# Patient Record
Sex: Female | Born: 1960 | Race: White | Hispanic: No | Smoking: Never smoker
Health system: Southern US, Community
[De-identification: ages and names within clinical notes are randomized; demographics above are authoritative.]

## PROBLEM LIST (undated history)

## (undated) ENCOUNTER — Ambulatory Visit

## (undated) DIAGNOSIS — M1990 Unspecified osteoarthritis, unspecified site: Secondary | ICD-10-CM

## (undated) DIAGNOSIS — R51 Headache: Secondary | ICD-10-CM

## (undated) DIAGNOSIS — F419 Anxiety disorder, unspecified: Secondary | ICD-10-CM

## (undated) DIAGNOSIS — A419 Sepsis, unspecified organism: Secondary | ICD-10-CM

## (undated) DIAGNOSIS — G2581 Restless legs syndrome: Secondary | ICD-10-CM

## (undated) DIAGNOSIS — M810 Age-related osteoporosis without current pathological fracture: Secondary | ICD-10-CM

## (undated) DIAGNOSIS — R519 Headache, unspecified: Secondary | ICD-10-CM

## (undated) DIAGNOSIS — I82409 Acute embolism and thrombosis of unspecified deep veins of unspecified lower extremity: Secondary | ICD-10-CM

## (undated) DIAGNOSIS — F32A Depression, unspecified: Secondary | ICD-10-CM

## (undated) DIAGNOSIS — IMO0002 Reserved for concepts with insufficient information to code with codable children: Secondary | ICD-10-CM

## (undated) DIAGNOSIS — Z87442 Personal history of urinary calculi: Secondary | ICD-10-CM

## (undated) DIAGNOSIS — T7840XA Allergy, unspecified, initial encounter: Secondary | ICD-10-CM

## (undated) DIAGNOSIS — N3289 Other specified disorders of bladder: Secondary | ICD-10-CM

## (undated) DIAGNOSIS — F329 Major depressive disorder, single episode, unspecified: Secondary | ICD-10-CM

## (undated) DIAGNOSIS — K219 Gastro-esophageal reflux disease without esophagitis: Secondary | ICD-10-CM

## (undated) DIAGNOSIS — N201 Calculus of ureter: Secondary | ICD-10-CM

## (undated) DIAGNOSIS — N3941 Urge incontinence: Secondary | ICD-10-CM

## (undated) DIAGNOSIS — G709 Myoneural disorder, unspecified: Secondary | ICD-10-CM

## (undated) DIAGNOSIS — D649 Anemia, unspecified: Secondary | ICD-10-CM

## (undated) DIAGNOSIS — G4730 Sleep apnea, unspecified: Secondary | ICD-10-CM

## (undated) DIAGNOSIS — N200 Calculus of kidney: Secondary | ICD-10-CM

## (undated) HISTORY — DX: Reserved for concepts with insufficient information to code with codable children: IMO0002

## (undated) HISTORY — DX: Other specified disorders of bladder: N32.89

## (undated) HISTORY — DX: Acute embolism and thrombosis of unspecified deep veins of unspecified lower extremity: I82.409

## (undated) HISTORY — PX: CHOLECYSTECTOMY: SHX55

## (undated) HISTORY — DX: Gastro-esophageal reflux disease without esophagitis: K21.9

## (undated) HISTORY — DX: Age-related osteoporosis without current pathological fracture: M81.0

## (undated) HISTORY — DX: Urge incontinence: N39.41

## (undated) HISTORY — DX: Myoneural disorder, unspecified: G70.9

## (undated) HISTORY — PX: ABDOMINAL HYSTERECTOMY: SHX81

## (undated) HISTORY — DX: Allergy, unspecified, initial encounter: T78.40XA

## (undated) HISTORY — PX: KIDNEY STONE SURGERY: SHX686

## (undated) HISTORY — DX: Calculus of ureter: N20.1

## (undated) HISTORY — PX: KNEE ARTHROSCOPY: SHX127

## (undated) HISTORY — PX: SINUS EXPLORATION: SHX5214

---

## 2004-05-19 HISTORY — PX: KNEE SURGERY: SHX244

## 2007-05-26 ENCOUNTER — Ambulatory Visit (HOSPITAL_COMMUNITY): Admission: RE | Admit: 2007-05-26 | Discharge: 2007-05-26 | Payer: Self-pay | Admitting: Rheumatology

## 2007-06-04 ENCOUNTER — Ambulatory Visit (HOSPITAL_COMMUNITY): Admission: RE | Admit: 2007-06-04 | Discharge: 2007-06-04 | Payer: Self-pay | Admitting: Rheumatology

## 2007-06-10 ENCOUNTER — Ambulatory Visit (HOSPITAL_COMMUNITY): Admission: RE | Admit: 2007-06-10 | Discharge: 2007-06-10 | Payer: Self-pay | Admitting: Rheumatology

## 2007-10-29 ENCOUNTER — Ambulatory Visit (HOSPITAL_COMMUNITY): Admission: RE | Admit: 2007-10-29 | Discharge: 2007-10-29 | Payer: Self-pay | Admitting: Family Medicine

## 2007-11-04 ENCOUNTER — Ambulatory Visit (HOSPITAL_COMMUNITY): Admission: RE | Admit: 2007-11-04 | Discharge: 2007-11-04 | Payer: Self-pay | Admitting: Family Medicine

## 2008-12-11 ENCOUNTER — Ambulatory Visit: Payer: Self-pay | Admitting: Internal Medicine

## 2009-02-01 ENCOUNTER — Ambulatory Visit: Payer: Self-pay | Admitting: Otolaryngology

## 2010-08-10 ENCOUNTER — Ambulatory Visit: Payer: Self-pay | Admitting: Internal Medicine

## 2010-12-22 ENCOUNTER — Emergency Department: Payer: Self-pay | Admitting: Emergency Medicine

## 2011-02-18 ENCOUNTER — Ambulatory Visit: Payer: Self-pay | Admitting: Surgery

## 2011-02-25 ENCOUNTER — Ambulatory Visit: Payer: Self-pay | Admitting: Surgery

## 2011-02-28 LAB — PATHOLOGY REPORT

## 2011-06-30 ENCOUNTER — Ambulatory Visit: Payer: Self-pay

## 2012-01-16 ENCOUNTER — Emergency Department: Payer: Self-pay | Admitting: Emergency Medicine

## 2012-01-16 LAB — COMPREHENSIVE METABOLIC PANEL
Albumin: 3.6 g/dL (ref 3.4–5.0)
Alkaline Phosphatase: 70 U/L (ref 50–136)
Anion Gap: 6 — ABNORMAL LOW (ref 7–16)
BUN: 16 mg/dL (ref 7–18)
Bilirubin,Total: 0.3 mg/dL (ref 0.2–1.0)
Calcium, Total: 8.9 mg/dL (ref 8.5–10.1)
Chloride: 106 mmol/L (ref 98–107)
Co2: 30 mmol/L (ref 21–32)
Creatinine: 0.78 mg/dL (ref 0.60–1.30)
EGFR (African American): >60
EGFR (Non-African Amer.): >60
Glucose: 87 mg/dL (ref 65–99)
Osmolality: 284 (ref 275–301)
Potassium: 3.7 mmol/L (ref 3.5–5.1)
SGOT(AST): 21 U/L (ref 15–37)
SGPT (ALT): 19 U/L (ref 12–78)
Sodium: 142 mmol/L (ref 136–145)
Total Protein: 7.2 g/dL (ref 6.4–8.2)

## 2012-01-16 LAB — CBC
HCT: 40.6 % (ref 35.0–47.0)
HGB: 14 g/dL (ref 12.0–16.0)
MCH: 30 pg (ref 26.0–34.0)
MCHC: 34.6 g/dL (ref 32.0–36.0)
MCV: 87 fL (ref 80–100)
Platelet: 196 10*3/uL (ref 150–440)
RBC: 4.68 10*6/uL (ref 3.80–5.20)
RDW: 12.4 % (ref 11.5–14.5)
WBC: 7.7 10*3/uL (ref 3.6–11.0)

## 2012-01-16 LAB — TROPONIN I: Troponin-I: <0.02 ng/mL

## 2012-01-16 LAB — CK TOTAL AND CKMB (NOT AT ARMC)
CK, Total: 97 U/L (ref 21–215)
CK-MB: 0.9 ng/mL (ref 0.5–3.6)

## 2012-03-15 ENCOUNTER — Ambulatory Visit: Payer: Self-pay

## 2012-03-22 ENCOUNTER — Ambulatory Visit: Payer: Self-pay

## 2012-03-31 ENCOUNTER — Ambulatory Visit: Payer: Self-pay | Admitting: Family Medicine

## 2012-05-09 ENCOUNTER — Ambulatory Visit: Payer: Self-pay | Admitting: Internal Medicine

## 2012-10-08 ENCOUNTER — Ambulatory Visit: Payer: Self-pay | Admitting: Internal Medicine

## 2013-05-02 ENCOUNTER — Ambulatory Visit: Payer: Self-pay | Admitting: Physician Assistant

## 2013-05-18 ENCOUNTER — Ambulatory Visit: Payer: Self-pay | Admitting: Internal Medicine

## 2013-05-27 ENCOUNTER — Ambulatory Visit: Payer: Self-pay | Admitting: Family Medicine

## 2013-06-15 ENCOUNTER — Ambulatory Visit: Payer: Self-pay | Admitting: Family Medicine

## 2013-10-20 ENCOUNTER — Ambulatory Visit: Payer: Self-pay | Admitting: Emergency Medicine

## 2013-10-20 LAB — RAPID STREP-A WITH REFLX: Micro Text Report: NEGATIVE

## 2013-10-22 LAB — BETA STREP CULTURE(ARMC)

## 2013-11-16 DIAGNOSIS — M25519 Pain in unspecified shoulder: Secondary | ICD-10-CM | POA: Insufficient documentation

## 2013-11-16 DIAGNOSIS — M25579 Pain in unspecified ankle and joints of unspecified foot: Secondary | ICD-10-CM | POA: Insufficient documentation

## 2013-12-02 ENCOUNTER — Ambulatory Visit: Payer: Self-pay | Admitting: Unknown Physician Specialty

## 2014-06-20 ENCOUNTER — Inpatient Hospital Stay: Payer: Self-pay | Admitting: Internal Medicine

## 2014-06-20 ENCOUNTER — Ambulatory Visit
Admission: RE | Admit: 2014-06-20 | Discharge: 2014-06-20 | Disposition: A | Discharge status: Home / Self Care | Payer: BLUE CROSS/BLUE SHIELD | Source: Ambulatory Visit | Attending: Gastroenterology | Admitting: Gastroenterology

## 2014-06-20 ENCOUNTER — Other Ambulatory Visit: Payer: Self-pay | Admitting: Gastroenterology

## 2014-06-20 DIAGNOSIS — R1032 Left lower quadrant pain: Secondary | ICD-10-CM

## 2014-06-20 LAB — URINALYSIS, COMPLETE
Bacteria: NONE SEEN
Bilirubin,UR: NEGATIVE
Glucose,UR: NEGATIVE mg/dL (ref 0–75)
Ketone: NEGATIVE
Nitrite: NEGATIVE
Ph: 6 (ref 4.5–8.0)
Protein: NEGATIVE
RBC,UR: 1 /HPF (ref 0–5)
Specific Gravity: 1.021 (ref 1.003–1.030)
Squamous Epithelial: 1
WBC UR: 144 /HPF (ref 0–5)

## 2014-06-20 LAB — COMPREHENSIVE METABOLIC PANEL
Albumin: 3.5 g/dL (ref 3.4–5.0)
Alkaline Phosphatase: 114 U/L (ref 46–116)
Anion Gap: 9 (ref 7–16)
BUN: 21 mg/dL — ABNORMAL HIGH (ref 7–18)
Bilirubin,Total: 1.2 mg/dL — ABNORMAL HIGH (ref 0.2–1.0)
Calcium, Total: 9.1 mg/dL (ref 8.5–10.1)
Chloride: 101 mmol/L (ref 98–107)
Co2: 27 mmol/L (ref 21–32)
Creatinine: 1.1 mg/dL (ref 0.60–1.30)
EGFR (African American): >60
EGFR (Non-African Amer.): 55 — ABNORMAL LOW
Glucose: 124 mg/dL — ABNORMAL HIGH (ref 65–99)
Osmolality: 278 (ref 275–301)
Potassium: 3.7 mmol/L (ref 3.5–5.1)
SGOT(AST): 300 U/L — ABNORMAL HIGH (ref 15–37)
SGPT (ALT): 186 U/L — ABNORMAL HIGH (ref 14–63)
Sodium: 137 mmol/L (ref 136–145)
Total Protein: 7.4 g/dL (ref 6.4–8.2)

## 2014-06-20 LAB — CBC
HCT: 42.8 % (ref 35.0–47.0)
HGB: 14.2 g/dL (ref 12.0–16.0)
MCH: 28.6 pg (ref 26.0–34.0)
MCHC: 33.2 g/dL (ref 32.0–36.0)
MCV: 86 fL (ref 80–100)
Platelet: 226 10*3/uL (ref 150–440)
RBC: 4.96 10*6/uL (ref 3.80–5.20)
RDW: 13.2 % (ref 11.5–14.5)
WBC: 21.7 10*3/uL — ABNORMAL HIGH (ref 3.6–11.0)

## 2014-06-20 MED ORDER — IOHEXOL 300 MG/ML  SOLN
125.0000 mL | Freq: Once | INTRAMUSCULAR | Status: AC | PRN
  Administered 2014-06-20: 125 mL via INTRAVENOUS

## 2014-06-21 LAB — CBC WITH DIFFERENTIAL/PLATELET
Basophil #: 0 10*3/uL (ref 0.0–0.1)
Basophil %: 0.1 %
Eosinophil #: 0 10*3/uL (ref 0.0–0.7)
Eosinophil %: 0 %
HCT: 35.1 % (ref 35.0–47.0)
HGB: 11.7 g/dL — ABNORMAL LOW (ref 12.0–16.0)
Lymphocyte #: 1.3 10*3/uL (ref 1.0–3.6)
Lymphocyte %: 6.4 %
MCH: 28.8 pg (ref 26.0–34.0)
MCHC: 33.5 g/dL (ref 32.0–36.0)
MCV: 86 fL (ref 80–100)
Monocyte #: 1.3 x10 3/mm — ABNORMAL HIGH (ref 0.2–0.9)
Monocyte %: 6.4 %
Neutrophil #: 17.8 10*3/uL — ABNORMAL HIGH (ref 1.4–6.5)
Neutrophil %: 87.1 %
Platelet: 150 10*3/uL (ref 150–440)
RBC: 4.08 10*6/uL (ref 3.80–5.20)
RDW: 13.7 % (ref 11.5–14.5)
WBC: 20.4 10*3/uL — ABNORMAL HIGH (ref 3.6–11.0)

## 2014-06-21 LAB — BASIC METABOLIC PANEL
Anion Gap: 6 — ABNORMAL LOW (ref 7–16)
BUN: 17 mg/dL (ref 7–18)
Calcium, Total: 8.2 mg/dL — ABNORMAL LOW (ref 8.5–10.1)
Chloride: 109 mmol/L — ABNORMAL HIGH (ref 98–107)
Co2: 27 mmol/L (ref 21–32)
Creatinine: 0.91 mg/dL (ref 0.60–1.30)
EGFR (African American): >60
EGFR (Non-African Amer.): >60
Glucose: 129 mg/dL — ABNORMAL HIGH (ref 65–99)
Osmolality: 286 (ref 275–301)
Potassium: 3.7 mmol/L (ref 3.5–5.1)
Sodium: 142 mmol/L (ref 136–145)

## 2014-06-22 LAB — URINE CULTURE

## 2014-06-25 LAB — CULTURE, BLOOD (SINGLE)

## 2014-07-13 ENCOUNTER — Ambulatory Visit: Payer: Self-pay | Admitting: Urology

## 2014-07-19 ENCOUNTER — Ambulatory Visit: Payer: Self-pay | Admitting: Urology

## 2014-08-24 ENCOUNTER — Ambulatory Visit
Admit: 2014-08-24 | Disposition: A | Discharge status: Home / Self Care | Payer: Self-pay | Attending: Urology | Admitting: Urology

## 2014-09-17 NOTE — Op Note (Signed)
PATIENT NAME:  Sharon [name], Sharon [name] MR#:  [id number] DATE OF BIRTH:  [birth year]  DATE OF PROCEDURE:  06/20/2014  PREOPERATIVE DIAGNOSIS:  Infected, obstructing, large left distal ureteral stone.  POSTOPERATIVE DIAGNOSIS:  Infected, obstructing, large left distal ureteral stone.  PRINCIPAL PROCEDURE:  Cystoscopy, left double-J stent placement (7 French x 24 cm Contour) without tether.   SURGEON:  Lillette Boxer. Dahlstedt, MD  ANESTHESIA:  General with LMA.  COMPLICATIONS:  None.  BRIEF HISTORY:  A 54 year old female who presented to the Emergency Room at [facility] earlier today with left flank pain with a history of this for approximately 1 month. She underwent colonoscopy recently, and with her pain continuing, she underwent CT scan, which revealed a 12 mm left distal ureteral stone. In the ER, she was tachycardic and had pyuria and an elevated white count. Urology was consulted.   On evaluation, she was found to be in significant pain, and with this infected, obstructing stone, it was recommended that she undergo urgent stent placement versus percutaneous nephrostomy tube placement. She presents at this time for the cystoscopy and stent placement. The risks as well as benefits of this procedure have been discussed with the patient and her daughter, who was in attendance. They desire to proceed.   DESCRIPTION OF PROCEDURE:  The patient was brought to the operating room. She was placed in the dorsal lithotomy position following administration of general anesthesia with LMA. Timeout was performed.   A 22 French panendoscope was advanced into her bladder. The bladder was inspected circumferentially. There were no tumors, trabeculations, or foreign bodies. Ureteral orifices were normal in configuration and location. No stone was evident at the left ureteral orifice. A 0.035 inch angle-tip Sensor tip guidewire was advanced into the left ureteral orifice and easily up to the left  renal pelvis using fluoroscopic guidance. There was no holdup from an obstructing stone. Once the proximal tip was seen fluoroscopically curled in the renal pelvis, a 7 French x 24 cm Contour stent with tether removed was advanced over the guidewire using a pusher. Following removal of the guidewire, excellent proximal and distal curls were seen on the stent using fluoroscopically and cystoscopy, respectively. At this point, significant purulent material was seen coming from the stent and the ureteral orifice on the left. The bladder was drained. The scope was removed. The patient was taken to the PACU in stable condition, having tolerated the procedure well.    ____________________________ Lillette Boxer. Dahlstedt, MD :nb D: 06/20/2014 21:01:57 ET T: 06/21/2014 02:15:33 ET JOB#: [id number]  cc: Lillette Boxer. Diona Fanti, MD, <Dictator>   Jorja Loa MD ELECTRONICALLY SIGNED 08/18/2014 11:31

## 2014-09-17 NOTE — Consult Note (Signed)
Chief Complaint:  Subjective/Chief Complaint Feeling much better this AM, anxious to eat breakfast.  No further fevers since MN.  Tachycardia improved, normotensive.  Complaining of HA from narcotics.   VITAL SIGNS/ANCILLARY NOTES: **Vital Signs.:   03-Feb-16 07:48  Vital Signs Type Q 4hr  Temperature Temperature (F) 98.4  Celsius 36.8  Temperature Source oral  Pulse Pulse 95  Respirations Respirations 18  Systolic BP Systolic BP 94  Diastolic BP (mmHg) Diastolic BP (mmHg) 59  Mean BP 70  Pulse Ox % Pulse Ox % 94  Pulse Ox Activity Level  At rest  Oxygen Delivery Room Air/ 21 %  *Intake and Output.:   Daily 03-Feb-16 07:00  Grand Totals Intake:  800 Output:  500    Net:  300 24 Hr.:  300  IV (Primary)      In:  800  Urine ml     Out:  500  Length of Stay Totals Intake:  800 Output:  500    Net:  300   Brief Assessment:  GEN well developed, well nourished, no acute distress   Cardiac Regular   Respiratory normal resp effort  clear BS   Gastrointestinal Normal   Gastrointestinal details normal Soft  Nontender  Nondistended  No masses palpable   EXTR negative cyanosis/clubbing   Additional Physical Exam Mild left flank pain   Lab Results: Routine Chem:  03-Feb-16 04:32   Glucose, Serum  129  BUN 17  Creatinine (comp) 0.91  Sodium, Serum 142  Potassium, Serum 3.7  Chloride, Serum  109  CO2, Serum 27  Calcium (Total), Serum  8.2  Anion Gap  6  Osmolality (calc) 286  eGFR (African American) >60  eGFR (Non-African American) >60 (eGFR values <70m/min/1.73 m2 may be an indication of chronic kidney disease (CKD). Calculated eGFR, using the MRDR Study equation, is useful in  patients with stable renal function. The eGFR calculation will not be reliable in acutely ill patients when serum creatinine is changing rapidly. It is not useful in patients on dialysis. The eGFR calculation may not be applicable to patients at the low and high extremes of body sizes,  pregnant women, and vegetarians.)  Routine Hem:  03-Feb-16 04:32   WBC (CBC)  20.4  RBC (CBC) 4.08  Hemoglobin (CBC)  11.7  Hematocrit (CBC) 35.1  Platelet Count (CBC) 150  MCV 86  MCH 28.8  MCHC 33.5  RDW 13.7  Neutrophil % 87.1  Lymphocyte % 6.4  Monocyte % 6.4  Eosinophil % 0.0  Basophil % 0.1  Neutrophil #  17.8  Lymphocyte # 1.3  Monocyte #  1.3  Eosinophil # 0.0  Basophil # 0.0 (Result(s) reported on 21 Jun 2014 at 05:33AM.)   Assessment/Plan:  Assessment/Plan:  Assessment 54[facility] old [facility] with a history of this for approximately 1 month. CT scan revealed a 12 mm left distal ureteral stone. In the ER, she was tachycardic and had pyuria and an elevated white count therefore she was taken to the OR by Dr. DLuberta Robertsonfor left ureteral stent placement.  Her vitals have normalized overnight.  UCx growing 100K GNR, on cipro.   Plan 1) continue abx, adjust based on culture and sensitivity data 2) ditropan 5 mg tid prn for bladder spasms 3) Flomax 0.4 mg for ureteral spasms 4) minimize narcotics as possible 5) OK for discharge with Urology f/u once afebrile x 24 hours, discussed need  for definative stone surgery as outpatient   Electronic Signatures: Sherlynn Stalls (MD)  (Signed 03-Feb-16 10:49)  Authored: Chief Complaint, VITAL SIGNS/ANCILLARY NOTES, Brief Assessment, Lab Results, Assessment/Plan   Last Updated: 03-Feb-16 10:49 by Sherlynn Stalls (MD)

## 2014-09-17 NOTE — Op Note (Signed)
PATIENT NAME:  Sharon [name], Sharon [name] MR#:  [id number] DATE OF BIRTH:  [birth year]  DATE OF PROCEDURE:  07/19/2014  PREOPERATIVE DIAGNOSIS: Left ureteropelvic junction stone.   POSTOPERATIVE DIAGNOSIS: Left ureteropelvic junction stone.   PROCEDURE PERFORMED: Left ureteroscopy, laser lithotripsy, left ureteral stent exchange, left retrograde pyelogram  ANESTHESIA: General anesthesia.   ATTENDING SURGEON: Sherlynn Stalls, MD   ESTIMATED BLOOD LOSS: Minimal.   DRAINS: A 6 x 24 French double-J ureteral stent on the left, no string.   SPECIMENS: Stone fragment.   COMPLICATIONS: None.   INDICATION: This is a 54 year old female who presented with acute onset left flank pain and leukocytosis, who was admitted a few weeks ago following left ureteral stent placement. She did have purulent drainage at the time of stent placement. She was ultimately discharged and presents today for definitive management of her 12 mm left UPJ stone. Risks and benefits of the procedure were explained in detail. The patient agreed to proceed as planned.   PROCEDURE: The patient was correctly identified in the preoperative holding area and informed consent was confirmed. She was brought to the operating suite and placed on the table in the supine position. At this time, a universal timeout protocol was performed and team members were identified. Venodyne boots were placed and she was administered IV ampicillin and gentamicin in the perioperative period. She was then placed under general anesthesia after being intubated. She was repositioned lower on the bed in the dorsal lithotomy position and prepped and draped in standard surgical fashion. A 22 French rigid cystoscope was advanced per urethra into the bladder. Attention was turned to the left ureteral orifice up through which a left ureteral stent was seen emanating. The distal coil of the ureteral stent was grasped and brought to the level of the urethral meatus. This was  then cannulated using a Sensor wire up to the level of the kidney. The stent was removed. The wire was snapped in place as a safety wire. The cystoscope was reintroduced and a second wire was placed up to the level of the kidney without difficulty. Given the stone burden, I did elect to use an access sheath using a 12/14 [facility] access sheath, which advanced quite easily up to the level of the UPJ. Of note, the stone could be seen within the renal pelvis on fluoroscopy. The inner cannula of the access sheath was removed and a flexible ureteroscope was advanced into the renal pelvis at which time the stone was identified. A 365 micron laser fiber was then brought in using the settings of 0.2 joules and 50 Hz. The stone was fragmented into very, very small pieces into dust-like debris. Some small fragments were chased into the upper pole, mid pole and lower pole calyces and care was taken to ensure that the stone was completely obliterated and there were no sizable pieces left greater than the tip of the laser fiber. I then did use a basket to basket out some of these dust fragments for stone analysis. The scope was then brought to the level of the UPJ and an additional retrograde pyelogram was performed. This created a map of the calyces and care was taken to inspect each and every calyx to ensure there were no residual stone fragments. Once the kidney was deemed adequately clear, the ureteroscope was backed down the length of the ureter removing the access sheath at the same time directly visualizing the ureter along the way. There was no ureteral trauma or residual stone fragments  within the ureter noted. A 6 x 24 French double-J ureteral stent was then placed under fluoroscopic guidance over the safety wire up to the level of the renal pelvis. The wire was partially withdrawn and a coil was noted within the renal pelvis. The wire was then fully withdrawn and a coil was noted within the bladder. The bladder was then  drained. The patient was repositioned in the supine position, reversed from anesthesia, and taken to the PACU in stable condition. There were no complications in this case.    ____________________________ Sherlynn Stalls, MD ajb:at D: 07/19/2014 16:53:49 ET T: 07/20/2014 15:36:47 ET JOB#: [id number]  cc: Sherlynn Stalls, MD, <Dictator> Sherlynn Stalls MD ELECTRONICALLY SIGNED 08/23/2014 8:12

## 2014-09-17 NOTE — Discharge Summary (Signed)
PATIENT NAME:  Sharon [name], Sharon [name] MR#:  [id number] DATE OF BIRTH:  02-27-1961  DATE OF ADMISSION:  06/20/2014 DATE OF DISCHARGE:  06/22/2014  DISCHARGE DIAGNOSES: 1.  Obstructive nephrolithiasis status post ureteral stent.  2.  Escherichia coli urinary tract infection. 3.  Sepsis.   SECONDARY DIAGNOSES:   Obesity, migraine headache, allergies, depression.  CONSULTATIONS: Urology, Dr. Hollice Espy.   PROCEDURES AND RADIOLOGY: Left ureteral stent for left distal ureteral stone which was infected, status post cystoscopy by Dr. Franchot Gallo.   Chest x-ray on 2nd of February showed no acute cardiopulmonary disease.   MAJOR LABORATORY PANEL: UA admission showed 144 WBCs, 3+ leukocyte esterase, no bacteria.   Blood cultures x2 were negative.   Urine culture grew more than 100,000 colonies of E. coli, pansensitive.   HISTORY AND SHORT HOSPITAL COURSE: The patient is a 54 year old female with the above-mentioned medical problems who was admitted for left flank pain with fever and chills, was found to have sepsis due to obstructing nephrolithiasis and UTI. Please see Dr. Lianne Moris dictated history and physical for further details. Urology consultation was obtained with Dr. Hollice Espy whose associate went ahead and put left ureteral stent for infected large left distal ureteral stone. The patient was subsequently doing well. Her pain started slowly improving. She did not have anymore fever in the last 24 to 48 hours, was slowly able to tolerate the diet and her urine grew E. coli for which her antibiotic was switched to p.o. ciprofloxacin. She was in agreement with the same. After discussion with urology and the patient, the patient was agreeable to go home on oral antibiotic. She was feeling much better on the date of discharge.  PERTINENT PHYSICAL EXAMINATION ON THE DATE OF DISCHARGE:  VITAL SIGNS: On the date of discharge, temperature 98, heart rate 86 per minute, respirations 17 per  minute, blood pressure 126/59 and she was saturating 95% on room air.  CARDIOVASCULAR: S1, S2 normal. No murmurs, rubs or gallop.  LUNGS: Clear to auscultation bilaterally. No wheezing, rales, rhonchi, crepitation.  ABDOMEN: Soft, benign.  NEUROLOGIC: Nonfocal examination. All other physical examination remained at baseline.   DISCHARGE MEDICATIONS:  Medication Instructions  levocetirizine 5 mg oral tablet  1 tab(s) orally once a day (in the evening)   famotidine 40 mg oral tablet  1 tab(s) orally once a day (at bedtime)   doxepin 10 mg oral capsule  1 cap(s) orally once a day (in the evening)   montelukast 10 mg oral tablet  1 tab(s) orally once a day (in the morning)   linzess 290 mcg oral capsule  1 cap(s) orally once a day   cipro 500 mg oral tablet  1 tab(s) orally every 12 hours x 7 days   ibuprofen 800 mg oral tablet  1 tab(s) orally 3 times a day (with meals) x 5 days, As Needed - for Pain    DISCHARGE DIET: Regular.   DISCHARGE ACTIVITY: As tolerated.   DISCHARGE INSTRUCTIONS AND FOLLOW-UP: The patient was instructed to follow up with her urologist, Dr. Hollice Espy, in 1 to 2 weeks.   TOTAL TIME DISCHARGING THIS PATIENT: 45 minutes.  ____________________________ Lucina Mellow. Manuella Ghazi, MD vss:sb D: 06/22/2014 16:39:10 ET T: 06/22/2014 17:16:07 ET JOB#: [id number]  cc: Duwan Adrian S. Manuella Ghazi, MD, <Dictator> Sherlynn Stalls, MD Lucina Mellow [facility] MD ELECTRONICALLY SIGNED 06/22/2014 17:47

## 2014-09-17 NOTE — H&P (Signed)
   History of Present Illness This 54 year old female presented to [facility] with a new diagnosis of a 12 mm left distal ureteral stone. She has had left flank/lower quadrant pain for the better part of a month. She recently underwent a negative colonoscopy, and a recent CT A/P for this pain. She was found to have a 12 mm obstructing left ureteral stone. She was sent to the ER here,and was found to have a WBC of 22 k as well as infected urine. She is admitted for urgent stent placement/abx management.   Past History Multiple drug allergies  s/p TAH  s/p sinus surgery  s/p lap choly  No h/o CAD,HTN/DM   Code Status Full Code   Past Med/Surgical Hx:  Obesity:   H/O Migraine headaches:   allergies:   depression:   Cholecystectomy:   sinus:   hysterectomy:   knee surgery:   ALLERGIES:  Penicillin: Rash  Oxycontin: Rash  Percocet 10/325: Rash  Neosporin: Blisters, Rash  Tramadol: Itching  Tape: Rash  Latex: Rash, Itching  Codeine: Other  Hydrocodone: Unknown  Aleve: Unknown  Family and Social History:  Family History calculous disease   Place of Living Home   Review of Systems:  Fever/Chills Yes   Cough No   Sputum No   Abdominal Pain Yes   Diarrhea No   Constipation No   Nausea/Vomiting Yes   SOB/DOE No   Chest Pain No   Tolerating Diet No   Physical Exam:  GEN well developed, well nourished, critically ill appearing   HEENT PERRL, hearing intact to voice   NECK supple  No masses   RESP normal resp effort  clear BS   CARD regular rate   ABD positive tenderness  denies Flank Tenderness  soft   LYMPH negative neck   EXTR negative cyanosis/clubbing   SKIN normal to palpation   PSYCH alert, A+O to time, place, person    Assessment/Admission Diagnosis Obstucting/infected left UVJ stone   Plan I discussed emergent stone management witht the patient and her daughter, who was peresent tiin the room with her. I would recommend urgent cysto/left J2  stent placement. IN  the event that stent is impossible to place, I will get IR involved for left percutaneous nephrostomy tube placement.   THey agree with plan.  She will require admission after the stent placement for adequate management of her unitTI.   Electronic Signatures: Dahlstedt, Lillette Boxer (MD)  (Signed [phone] 19:23)  Authored: CHIEF COMPLAINT and HISTORY, PAST MEDICAL/SURGIAL HISTORY, ALLERGIES, FAMILY AND SOCIAL HISTORY, REVIEW OF SYSTEMS, PHYSICAL EXAM, ASSESSMENT AND PLAN   Last Updated: 02-Feb-16 19:23 by Jorja Loa (MD)

## 2014-09-17 NOTE — H&P (Signed)
PATIENT NAME:  Sharon [name], Sharon [name] MR#:  [id number] DATE OF BIRTH:  [birth year]  DATE OF ADMISSION:  06/20/2014  PRIMARY CARE PHYSICIAN: Nonlocal.  REFERRING PHYSICIAN: Lillette Boxer. Dahlstedt, MD, urologist.  CHIEF COMPLAINT: Left flank pain for 1 month; fever and chills since yesterday.   HISTORY OF PRESENT ILLNESS: The patient is a 54 year old Caucasian female with a history of obesity, allergies, depression, presented to the ED with the above chief complaint. The patient is alert, awake, oriented, in no acute distress. She is status post left ureteral stent just now, she is in recovery room now. The patient said that she has had left flank pain for the past 1 week with hematuria. The patient started to have a fever and chills since yesterday. Temperature was 100 and today increased to 103. The patient got a CT, which showed 12 mm obstructing left ureteral stone. The patient also has WBC 22. She also had tachycardia at 120 to 130. Urologist urgently placed a left ureteral stent just now. He suggested admitting under hospitalist service. The patient has nausea, but no vomiting or diarrhea. She has a headache, but denies any other symptoms.   PAST MEDICAL HISTORY: Obesity, migraine headache, allergies, depression.  PAST SURGICAL HISTORY: Cholecystectomy, hysterectomy, knee surgery.   FAMILY HISTORY: Father had lung disease and died of MI.   ALLERGIES: TO ALEVE, CODEINE, HYDROCODONE, NEOSPORIN, OXYCONTIN, PENICILLIN, PERCOCET, TRAMADOL, LATEX, TAPE.   HOME MEDICATIONS: Medication reconciliation is not done yet; but according to the patient, the patient is taking Singulair and allergy medication, she cannot remember.  REVIEW OF SYSTEMS: CONSTITUTIONAL: The patient has a fever, chills, headache. No dizziness, but has weakness.  EYES: No double vision or blurry vision.  EAR, NOSE, THROAT: No postnasal drip, slurred speech, or dysphagia.  CARDIOVASCULAR: No chest pain, palpitation, orthopnea, or  nocturnal dyspnea. No leg edema.  PULMONARY: No cough, sputum, shortness of breath, or hemoptysis.  GASTROINTESTINAL: Positive for abdominal pain and flank pain and nausea. No vomiting or diarrhea. No melena or bloody stool.  GENITOURINARY: Positive for hematuria, but no dysuria or incontinence.  SKIN: No rash or jaundice.  NEUROLOGY: No syncope, loss of consciousness, or seizure.  ENDOCRINOLOGY: No polyuria, polydipsia, heat or cold intolerance.  HEMATOLOGY: No easy bruising or bleeding.   PHYSICAL EXAMINATION:  VITAL SIGNS: Temperature 102.5, blood pressure 140/76, pulse 130, oxygen saturation 97% on room air.  GENERAL: The patient is alert, awake, oriented, in no acute distress.  HEENT: Pupils round, equal and reactive to light and accommodation. Moist oral mucosa. Clear oropharynx.  NECK: Supple. No JVD or carotid bruit. No lymphadenopathy. No thyromegaly.  CARDIOVASCULAR: S1 and S2, regular rate, tachycardia, no murmurs or gallops.  PULMONARY: Bilateral air entry. No wheezing or rales. No use of accessory muscles to breathe.  ABDOMEN: Soft. No distention but has tenderness in the middle part of the abdomen. Bowel sounds present. No rigidity. No rebound. No organomegaly. Bilateral flank tenderness.  EXTREMITIES: No edema, clubbing or cyanosis. No calf tenderness. Bilateral pedal pulses present.   LABORATORY DATA: Chest x-ray did not show any significant abnormality. Glucose 124, BUN 21, creatinine 1.10. Electrolytes are normal. Bilirubin 1.2, SGOT 300, SGPT 186. WBC 21.7, hemoglobin 14.2, platelet at 226,000. Urinalysis showed wbc 144, rbc 1.   IMPRESSIONS:  1.  Obstructing nephrolithiasis.  2.  Urinary tract infection.  3.  Sepsis.  4.  Abnormal liver function tests.  5.  Obesity.  6.  MULTIPLE DRUG ALLERGIES.  PLAN OF TREATMENT:  1.  The  patient will be admitted to telemetry floor with telemonitor. For sepsis with urinary tract infection, I will continue Cipro IV b.i.d. and follow  up blood culture, urine culture and a CBC.  2.  For obstructing nephrolithiasis, the patient is status post ureteral stent placement. Follow up with urologist.  3.  For abnormal liver function tests, we will get an abdominal ultrasound.  4.  Check lipid panel.  5.  For dehydration, I will give IV fluid support.   I discussed the patient's condition and plan of treatment with the patient. The patient wants full code.   TIME SPENT: About 53 minutes.    ____________________________ Demetrios Loll, MD qc:TT D: 06/20/2014 22:01:13 ET T: 06/20/2014 22:38:06 ET JOB#: [id number]  cc: Demetrios Loll, MD, <Dictator> Demetrios Loll MD ELECTRONICALLY SIGNED 06/22/2014 19:16

## 2015-01-01 ENCOUNTER — Other Ambulatory Visit: Payer: Self-pay

## 2015-01-01 DIAGNOSIS — N200 Calculus of kidney: Secondary | ICD-10-CM

## 2015-01-10 DIAGNOSIS — S43431D Superior glenoid labrum lesion of right shoulder, subsequent encounter: Secondary | ICD-10-CM | POA: Insufficient documentation

## 2015-01-10 DIAGNOSIS — M7581 Other shoulder lesions, right shoulder: Secondary | ICD-10-CM | POA: Insufficient documentation

## 2015-01-10 DIAGNOSIS — S43431A Superior glenoid labrum lesion of right shoulder, initial encounter: Secondary | ICD-10-CM | POA: Insufficient documentation

## 2015-01-30 ENCOUNTER — Other Ambulatory Visit: Payer: Self-pay | Admitting: Surgery

## 2015-01-30 DIAGNOSIS — M7581 Other shoulder lesions, right shoulder: Secondary | ICD-10-CM

## 2015-01-30 DIAGNOSIS — S43431A Superior glenoid labrum lesion of right shoulder, initial encounter: Secondary | ICD-10-CM

## 2015-02-09 ENCOUNTER — Ambulatory Visit
Admission: RE | Admit: 2015-02-09 | Discharge: 2015-02-09 | Disposition: A | Discharge status: Home / Self Care | Payer: BLUE CROSS/BLUE SHIELD | Source: Ambulatory Visit | Attending: Surgery | Admitting: Surgery

## 2015-02-09 DIAGNOSIS — S43431A Superior glenoid labrum lesion of right shoulder, initial encounter: Secondary | ICD-10-CM

## 2015-02-09 DIAGNOSIS — M7551 Bursitis of right shoulder: Secondary | ICD-10-CM | POA: Insufficient documentation

## 2015-02-09 DIAGNOSIS — S46911A Strain of unspecified muscle, fascia and tendon at shoulder and upper arm level, right arm, initial encounter: Secondary | ICD-10-CM | POA: Diagnosis not present

## 2015-02-09 DIAGNOSIS — M7581 Other shoulder lesions, right shoulder: Secondary | ICD-10-CM

## 2015-02-09 DIAGNOSIS — X58XXXA Exposure to other specified factors, initial encounter: Secondary | ICD-10-CM | POA: Diagnosis not present

## 2015-03-20 ENCOUNTER — Encounter: Payer: Self-pay | Admitting: Emergency Medicine

## 2015-03-20 ENCOUNTER — Ambulatory Visit
Admission: EM | Admit: 2015-03-20 | Discharge: 2015-03-20 | Disposition: A | Discharge status: Home / Self Care | Payer: BLUE CROSS/BLUE SHIELD | Attending: Family Medicine | Admitting: Family Medicine

## 2015-03-20 DIAGNOSIS — J069 Acute upper respiratory infection, unspecified: Secondary | ICD-10-CM

## 2015-03-20 HISTORY — DX: Calculus of kidney: N20.0

## 2015-03-20 MED ORDER — BENZONATATE 100 MG PO CAPS: 200.0000 mg | ORAL_CAPSULE | Freq: Three times a day (TID) | ORAL | Status: DC

## 2015-03-20 NOTE — ED Provider Notes (Signed)
CSN: [id number]     Arrival date & time 03/20/15  0807 History   First MD Initiated Contact with Patient 03/20/15 [phone]     Chief Complaint  Patient presents with  . Sore Throat   (Consider location/radiation/quality/duration/timing/severity/associated sxs/prior Treatment) HPI   This 54 year old female who presents with a cough sore throat and body aches as well as facial pain and headache for the last 2 days. States that her sore throat is gotten worse overnight so she decided to come here. She has some productive mucus from her nose but is not having any productive sputum from her lungs. Eyes any fever or chills. His had a postnasal drip which may be what is causing her cough. She has numerous allergies for which she takes allergy shots weekly. Has used lowness in the past but is not using it currently Past Medical History  Diagnosis Date  . Kidney calculi    Past Surgical History  Procedure Laterality Date  . Kidney stone surgery    . Abdominal hysterectomy    . Cholecystectomy    . Knee arthroscopy    . Sinus exploration     Family History  Problem Relation Age of Onset  . Cancer Mother   . Lung cancer Father    Social History  Substance Use Topics  . Smoking status: Never Smoker   . Smokeless tobacco: None  . Alcohol Use: No   OB History    No data available     Review of Systems  Constitutional: Positive for activity change. Negative for fever, chills and fatigue.  HENT: Positive for congestion, postnasal drip, rhinorrhea, sinus pressure, sneezing and sore throat. Negative for ear discharge, ear pain and facial swelling.   Respiratory: Positive for cough.   All other systems reviewed and are negative.   Allergies  Codeine and Penicillins  Home Medications   Prior to Admission medications   Medication Sig Start Date End Date Taking? Authorizing Provider  doxepin (SINEQUAN) 10 MG capsule Take 10 mg by mouth.   Yes Historical Provider, MD  famotidine (PEPCID) 40  MG tablet Take 40 mg by mouth daily.   Yes Historical Provider, MD  levocetirizine (XYZAL) 5 MG tablet Take 5 mg by mouth every evening.   Yes Historical Provider, MD  montelukast (SINGULAIR) 10 MG tablet Take 10 mg by mouth at bedtime.   Yes Historical Provider, MD  benzonatate (TESSALON) 100 MG capsule Take 2 capsules (200 mg total) by mouth every 8 (eight) hours. 03/20/15   Lorin Picket, PA-C   Meds Ordered and Administered this Visit  Medications - No data to display  BP 124/77 mmHg  Pulse 74  Temp(Src) 97.9 F (36.6 C) (Oral)  Resp 18  Ht 5\' 6"  (1.676 m)  Wt 241 lb (109.317 kg)  BMI 38.92 kg/m2  SpO2 99% No data found.   Physical Exam  Constitutional: She is oriented to person, place, and time. She appears well-developed and well-nourished. No distress.  HENT:  Head: Normocephalic and atraumatic.  Right Ear: External ear normal.  Left Ear: External ear normal.  Nose: Nose normal.  Mouth/Throat: Oropharynx is clear and moist. No oropharyngeal exudate.  Eyes: EOM are normal. Pupils are equal, round, and reactive to light. Right eye exhibits no discharge. Left eye exhibits no discharge.  Neck: Neck supple.  Pulmonary/Chest: Effort normal and breath sounds normal. No respiratory distress. She has no wheezes. She has no rales.  Musculoskeletal: Normal range of motion. She exhibits no edema or  tenderness.  Lymphadenopathy:    She has no cervical adenopathy.  Neurological: She is alert and oriented to person, place, and time.  Skin: Skin is warm and dry. No rash noted. She is not diaphoretic. No erythema.  Psychiatric: She has a normal mood and affect. Her behavior is normal. Judgment and thought content normal.  Nursing note and vitals reviewed.   ED Course  Procedures (including critical care time)  Labs Review Labs Reviewed - No data to display  Imaging Review No results found.   Visual Acuity Review  Right Eye Distance:   Left Eye Distance:   Bilateral  Distance:    Right Eye Near:   Left Eye Near:    Bilateral Near:         MDM   1. URI (upper respiratory infection)    New Prescriptions   BENZONATATE (TESSALON) 100 MG CAPSULE    Take 2 capsules (200 mg total) by mouth every 8 (eight) hours.  Plan: 1. Test/x-ray results and diagnosis reviewed with patient 2. rx as per orders; risks, benefits, potential side effects reviewed with patient 3. Recommend supportive treatment with fluids, salt water gargles 4. F/u prn if symptoms worsen or don't improve     Lorin Picket, PA-C 03/20/15 [phone]

## 2015-03-20 NOTE — Discharge Instructions (Signed)
Cool Mist Vaporizers °Vaporizers may help relieve the symptoms of a cough and cold. They add moisture to the air, which helps mucus to become thinner and less sticky. This makes it easier to breathe and cough up secretions. Cool mist vaporizers do not cause serious burns like hot mist vaporizers, which may also be called steamers or humidifiers. Vaporizers have not been proven to help with colds. You should not use a vaporizer if you are allergic to mold. °HOME CARE INSTRUCTIONS °· Follow the package instructions for the vaporizer. °· Do not use anything other than distilled water in the vaporizer. °· Do not run the vaporizer all of the time. This can cause mold or bacteria to grow in the vaporizer. °· Clean the vaporizer after each time it is used. °· Clean and dry the vaporizer well before storing it. °· Stop using the vaporizer if worsening respiratory symptoms develop. °  °This information is not intended to replace advice given to you by your health care provider. Make sure you discuss any questions you have with your health care provider. °  °Document Released: 01/31/2004 Document Revised: 05/10/2013 Document Reviewed: 09/22/2012 °Elsevier Interactive Patient Education ©2016 [employer] ° °Upper Respiratory Infection, Adult °Most upper respiratory infections (URIs) are a viral infection of the air passages leading to the lungs. A URI affects the nose, throat, and upper air passages. The most common type of URI is nasopharyngitis and is typically referred to as "the common cold." °URIs run their course and usually go away on their own. Most of the time, a URI does not require medical attention, but sometimes a bacterial infection in the upper airways can follow a viral infection. This is called a secondary infection. Sinus and middle ear infections are common types of secondary upper respiratory infections. °Bacterial pneumonia can also complicate a URI. A URI can worsen asthma and chronic obstructive  pulmonary disease (COPD). Sometimes, these complications can require emergency medical care and may be life threatening.  °CAUSES °Almost all URIs are caused by viruses. A virus is a type of germ and can spread from one person to another.  °RISKS FACTORS °You may be at risk for a URI if:  °· You smoke.   °· You have chronic heart or lung disease. °· You have a weakened defense (immune) system.   °· You are very young or very old.   °· You have nasal allergies or asthma. °· You work in crowded or poorly ventilated areas. °· You work in health care facilities or schools. °SIGNS AND SYMPTOMS  °Symptoms typically develop 2-3 days after you come in contact with a cold virus. Most viral URIs last 7-10 days. However, viral URIs from the influenza virus (flu virus) can last 14-18 days and are typically more severe. Symptoms may include:  °· Runny or stuffy (congested) nose.   °· Sneezing.   °· Cough.   °· Sore throat.   °· Headache.   °· Fatigue.   °· Fever.   °· Loss of appetite.   °· Pain in your forehead, behind your eyes, and over your cheekbones (sinus pain). °· Muscle aches.   °DIAGNOSIS  °Your health care provider may diagnose a URI by: °· Physical exam. °· Tests to check that your symptoms are not due to another condition such as: °¨ Strep throat. °¨ Sinusitis. °¨ Pneumonia. °¨ Asthma. °TREATMENT  °A URI goes away on its own with time. It cannot be cured with medicines, but medicines may be prescribed or recommended to relieve symptoms. Medicines may help: °· Reduce your fever. °· Reduce   your cough. °· Relieve nasal congestion. °HOME CARE INSTRUCTIONS  °· Take medicines only as directed by your health care provider.   °· Gargle warm saltwater or take cough drops to comfort your throat as directed by your health care provider. °· Use a warm mist humidifier or inhale steam from a shower to increase air moisture. This may make it easier to breathe. °· Drink enough fluid to keep your urine clear or pale yellow.   °· Eat  soups and other clear broths and maintain good nutrition.   °· Rest as needed.   °· Return to work when your temperature has returned to normal or as your health care provider advises. You may need to stay home longer to avoid infecting others. You can also use a face mask and careful hand washing to prevent spread of the virus. °· Increase the usage of your inhaler if you have asthma.   °· Do not use any tobacco products, including cigarettes, chewing tobacco, or electronic cigarettes. If you need help quitting, ask your health care provider. °PREVENTION  °The best way to protect yourself from getting a cold is to practice good hygiene.  °· Avoid oral or hand contact with people with cold symptoms.   °· Wash your hands often if contact occurs.   °There is no clear evidence that vitamin C, vitamin E, echinacea, or exercise reduces the chance of developing a cold. However, it is always recommended to get plenty of rest, exercise, and practice good nutrition.  °SEEK MEDICAL CARE IF:  °· You are getting worse rather than better.   °· Your symptoms are not controlled by medicine.   °· You have chills. °· You have worsening shortness of breath. °· You have brown or red mucus. °· You have yellow or brown nasal discharge. °· You have pain in your face, especially when you bend forward. °· You have a fever. °· You have swollen neck glands. °· You have pain while swallowing. °· You have white areas in the back of your throat. °SEEK IMMEDIATE MEDICAL CARE IF:  °· You have severe or persistent: °¨ Headache. °¨ Ear pain. °¨ Sinus pain. °¨ Chest pain. °· You have chronic lung disease and any of the following: °¨ Wheezing. °¨ Prolonged cough. °¨ Coughing up blood. °¨ A change in your usual mucus. °· You have a stiff neck. °· You have changes in your: °¨ Vision. °¨ Hearing. °¨ Thinking. °¨ Mood. °MAKE SURE YOU:  °· Understand these instructions. °· Will watch your condition. °· Will get help right away if you are not doing well or  get worse. °  °This information is not intended to replace advice given to you by your health care provider. Make sure you discuss any questions you have with your health care provider. °  °Document Released: 10/29/2000 Document Revised: 09/19/2014 Document Reviewed: 08/10/2013 °Elsevier Interactive Patient Education ©2016 Elsevier Inc. ° °

## 2015-03-20 NOTE — ED Notes (Signed)
Patient states she developed a cough, sore throat and body aches, headache and facial pain 2 days ago

## 2015-08-27 ENCOUNTER — Encounter: Payer: Self-pay | Admitting: *Deleted

## 2015-09-05 DIAGNOSIS — M2550 Pain in unspecified joint: Secondary | ICD-10-CM | POA: Insufficient documentation

## 2015-09-05 DIAGNOSIS — Z9049 Acquired absence of other specified parts of digestive tract: Secondary | ICD-10-CM | POA: Insufficient documentation

## 2015-09-06 ENCOUNTER — Ambulatory Visit (INDEPENDENT_AMBULATORY_CARE_PROVIDER_SITE_OTHER): Payer: BLUE CROSS/BLUE SHIELD | Admitting: Urology

## 2015-09-06 ENCOUNTER — Ambulatory Visit
Admission: RE | Admit: 2015-09-06 | Discharge: 2015-09-06 | Disposition: A | Discharge status: Home / Self Care | Payer: BLUE CROSS/BLUE SHIELD | Source: Ambulatory Visit | Attending: Urology | Admitting: Urology

## 2015-09-06 ENCOUNTER — Encounter: Payer: Self-pay | Admitting: Urology

## 2015-09-06 VITALS — BP 114/80 | HR 118 | Ht 66.0 in | Wt 247.5 lb

## 2015-09-06 DIAGNOSIS — N3941 Urge incontinence: Secondary | ICD-10-CM | POA: Diagnosis not present

## 2015-09-06 DIAGNOSIS — R351 Nocturia: Secondary | ICD-10-CM | POA: Diagnosis not present

## 2015-09-06 DIAGNOSIS — N200 Calculus of kidney: Secondary | ICD-10-CM

## 2015-09-06 LAB — URINALYSIS, COMPLETE
Bilirubin, UA: NEGATIVE
Glucose, UA: NEGATIVE
Ketones, UA: NEGATIVE
Nitrite, UA: NEGATIVE
Protein, UA: NEGATIVE
RBC, UA: NEGATIVE
Specific Gravity, UA: 1.015 (ref 1.005–1.030)
Urobilinogen, Ur: 0.2 mg/dL (ref 0.2–1.0)
pH, UA: 6.5 (ref 5.0–7.5)

## 2015-09-06 LAB — MICROSCOPIC EXAMINATION
Epithelial Cells (non renal): >10 /hpf — AB (ref 0–10)
RBC, UA: NONE SEEN /hpf (ref 0–?)

## 2015-09-06 LAB — BLADDER SCAN AMB NON-IMAGING: Scan Result: 41

## 2015-09-06 NOTE — Progress Notes (Signed)
09/06/2015 10:05 AM   Sharon [name] Jul 19, 1960 [id number]  Referring provider: No referring provider defined for this encounter.  Chief Complaint  Patient presents with  . Nephrolithiasis    1 year follow up with KUB    HPI: Patient is a 55 year old Caucasian female who has a history of a left ureteral stone who presents today for a 1 year follow-up.  Background history Patient presented to the Emergency Room at [facility] on 06/20/13 with left flank pain with a history of this for approximately 1 month. CT scan revealed a 12 mm left distal ureteral stone. In the ER, she was tachycardic and had pyuria and an elevated white count therefore she was taken to the OR by Dr. Luberta Robertson for left ureteral stent placement. Her vitals have normalized overnight. UCx grew 100K Pansensitive E. coli and she was discharged home on Cipro. She was seen an LEFT ureteroscopy, laser lithotripsy, LEFT ureteral stent exchange on 07/19/14. She tolerated the procedure well and the stone was obliterated. She returned for cystoscopy/ stent removal on 08/04/14. Follow up renal ultrasound shows no residual stone or hydronephrosis.  Stone analysis shows stone composition 15% calcium oxalate monohydrate, 60% calcium oxalate dihydrate, and 25% calcium phosphate.  This was her first stone episode. She does admit to not drinking enough water.    She has not had flank pain or gross hematuria over the last year.  KUB taken today demonstrates a small calculus over the lower pole of the left kidney.  I have personally reviewed the film.    She does states that over the last 3-4 months she is been experiencing urgency, urge incontinence and nocturia.  She states that she is making 5 trips to the bathroom during the day as experiencing 3-4 episodes of nocturia.  She is not losing urine if she laughs, coughs or sneezes.  She has not had any urinary tract infections and does not feel she  is not emptying her bladder.  She is undergoing a sleep apnea evaluation at this time.  Her UA today is unremarkable and her PVR is 41 mL.   PMH: Past Medical History  Diagnosis Date  . Kidney calculi   . Leg DVT (deep venous thromboembolism), acute ([place])   . Acid reflux   . Left ureteral stone   . Bladder spasm     Surgical History: Past Surgical History  Procedure Laterality Date  . Kidney stone surgery    . Abdominal hysterectomy    . Cholecystectomy    . Knee arthroscopy    . Sinus exploration      Home Medications:    Medication List       This list is accurate as of: 09/06/15 10:05 AM.  Always use your most recent med list.               benzonatate 100 MG capsule  Commonly known as:  TESSALON  Take 2 capsules (200 mg total) by mouth every 8 (eight) hours.     cyclobenzaprine 10 MG tablet  Commonly known as:  FLEXERIL  TAKE 1 TABLET (10 MG TOTAL) BY MOUTH 3 (THREE) TIMES DAILY AS NEEDED.     doxepin 10 MG capsule  Commonly known as:  SINEQUAN  Take 10 mg by mouth.     EPIPEN 2-PAK 0.3 mg/0.3 mL Soaj injection  Generic drug:  EPINEPHrine     famotidine 40 MG tablet  Commonly known as:  PEPCID  Take 40 mg  by mouth daily.     fluticasone 50 MCG/ACT nasal spray  Commonly known as:  FLONASE  1-2 SPRAYS ONCE A DAY NASALLY 30 DAYS     ibuprofen 200 MG tablet  Commonly known as:  ADVIL,MOTRIN  Take by mouth. Reported on 09/06/2015     levocetirizine 5 MG tablet  Commonly known as:  XYZAL  Take 5 mg by mouth every evening.     LINZESS 290 MCG Caps capsule  Generic drug:  linaclotide  TAKE ONE CAPSULE BY MOUTH BEFORE FIRST MEAL OF THE DAY     montelukast 10 MG tablet  Commonly known as:  SINGULAIR  Take 10 mg by mouth at bedtime.     mupirocin ointment 2 %  Commonly known as:  BACTROBAN  Reported on 09/06/2015     PATADAY 0.2 % Soln  Generic drug:  Olopatadine HCl     phentermine 37.5 MG tablet  Commonly known as:  ADIPEX-P  Take by  mouth. Reported on 09/06/2015        Allergies:  Allergies  Allergen Reactions  . Alcohol Other (See Comments)  . Bacitracin-Neomycin-Polymyxin Other (See Comments)  . Codeine   . Hydrocodone Other (See Comments)  . Hydrocortisone Other (See Comments)  . Lanolin Other (See Comments)  . Nickel Other (See Comments)  . Penicillins   . Tape Other (See Comments)  . Bacitracin Rash  . Clarithromycin Rash  . Latex Rash  . Oxycodone Rash  . Sulfamethoxazole-Trimethoprim Rash    Family History: Family History  Problem Relation Age of Onset  . Cancer Mother   . Lung cancer Father   . Hematuria Brother   . Kidney disease Paternal Grandfather   . Bladder Cancer Neg Hx   . Kidney Stones Brother     Social History:  reports that she has never smoked. She does not have any smokeless tobacco history on file. She reports that she does not drink alcohol. Her drug history is not on file.  ROS: UROLOGY Frequent Urination?: No Hard to postpone urination?: No Burning/pain with urination?: No Get up at night to urinate?: Yes Leakage of urine?: No Urine stream starts and stops?: No Trouble starting stream?: No Do you have to strain to urinate?: No Blood in urine?: No Urinary tract infection?: No Sexually transmitted disease?: No Injury to kidneys or bladder?: No Painful intercourse?: No Weak stream?: No Currently pregnant?: No Vaginal bleeding?: No Last menstrual period?: n  Gastrointestinal Nausea?: No Vomiting?: No Indigestion/heartburn?: No Diarrhea?: No Constipation?: No  Constitutional Fever: No Night sweats?: No Weight loss?: No Fatigue?: No  Skin Skin rash/lesions?: No Itching?: No  Eyes Blurred vision?: No Double vision?: No  Ears/Nose/Throat Sore throat?: No Sinus problems?: Yes  Hematologic/Lymphatic Swollen glands?: No Easy bruising?: No  Cardiovascular Leg swelling?: No Chest pain?: No  Respiratory Cough?: No Shortness of breath?:  No  Endocrine Excessive thirst?: No  Musculoskeletal Back pain?: Yes Joint pain?: Yes  Neurological Headaches?: Yes Dizziness?: No  Psychologic Depression?: No Anxiety?: No  Physical Exam: BP 114/80 mmHg  Pulse 118  Ht 5\' 6"  (1.676 m)  Wt 247 lb 8 oz (112.265 kg)  BMI 39.97 kg/m2  Constitutional: Well nourished. Alert and oriented, No acute distress. HEENT: [place] AT, moist mucus membranes. Trachea midline, no masses. Cardiovascular: No clubbing, cyanosis, or edema. Respiratory: Normal respiratory effort, no increased work of breathing. Skin: No rashes, bruises or suspicious lesions. Lymph: No cervical or inguinal adenopathy. Neurologic: Grossly intact, no focal deficits, moving all 4  extremities. Psychiatric: Normal mood and affect.  Laboratory Data: Lab Results  Component Value Date   WBC 20.4* 06/21/2014   HGB 11.7* 06/21/2014   HCT 35.1 06/21/2014   MCV 86 06/21/2014   PLT 150 06/21/2014    Lab Results  Component Value Date   CREATININE 0.91 06/21/2014    Lab Results  Component Value Date   AST 300* 06/20/2014   Lab Results  Component Value Date   ALT 186* 06/20/2014     Urinalysis Results for orders placed or performed in visit on 09/06/15  Microscopic Examination  Result Value Ref Range   WBC, UA 11-30 (A) 0 -  5 /hpf   RBC, UA None seen 0 -  2 /hpf   Epithelial Cells (non renal) >10 (A) 0 - 10 /hpf   Renal Epithel, UA 0-10 (A) None seen /hpf   Bacteria, UA Few None seen/Few  Urinalysis, Complete  Result Value Ref Range   Specific Gravity, UA 1.015 1.005 - 1.030   pH, UA 6.5 5.0 - 7.5   Color, UA Yellow Yellow   Appearance Ur Hazy (A) Clear   Leukocytes, UA 2+ (A) Negative   Protein, UA Negative Negative/Trace   Glucose, UA Negative Negative   Ketones, UA Negative Negative   RBC, UA Negative Negative   Bilirubin, UA Negative Negative   Urobilinogen, Ur 0.2 0.2 - 1.0 mg/dL   Nitrite, UA Negative Negative   Microscopic Examination See  below:   BLADDER SCAN AMB NON-IMAGING  Result Value Ref Range   Scan Result 41     Pertinent Imaging: CLINICAL DATA: History of renal calculi.  EXAM: ABDOMEN - 1 VIEW  COMPARISON: CT abdomen and pelvis June 20, 2014  FINDINGS: No ureteral calculi are identified. There remain small calculi in the lower pole the left kidney, largest measuring 4 x 2 mm. None there are probable phleboliths in the lower left pelvis. There is moderate stool in the colon. There is no bowel dilatation or air-fluid level suggesting obstruction. No free air. There are surgical clips in the right upper quadrant region.  IMPRESSION: Small calculi lower pole left kidney. No ureteral calculus evident. Probable phleboliths in the pelvis. Bowel gas pattern overall unremarkable.   Electronically Signed  By: Lowella Grip III M.D.  On: 09/06/2015 12:23  Results for JOCEE, CARREIRA (MRN [id number]) as of 09/10/2015 20:09  Ref. Range 09/06/2015 10:01  Scan Result Unknown 41    Assessment & Plan:    1. Kidney stones:   Patient has a small calculi in the lower pole left kidney.  We will continue to monitor.  She will have yearly KUB's.  She will contact the office or seek treatment in the emergency room if she should develop flank pain or gross hematuria.   2. Urge incontinence:   I discussed with the patient the options of physical therapy and/or medical therapy.  She would like to try an overactive bladder agent.   I have given her samples of Toviaz 4 mg.   I have advised her of the side effects of Toviaz, such as: Dry eyes, dry mouth, constipation, mental confusion and/or urinary retention.  Marland Kitchen   She will return in 3 weeks for PVR and symptom recheck  - Urinalysis, Complete  3. Nocturia:   Patient will be undergoing a sleep study in the future.  We will discuss the findings when she returns in 3 weeks.  Return in about 3 weeks (around 09/27/2015) for PVR.  These notes  generated  with voice recognition software. I apologize for typographical errors.  Zara Council, [facility] [address] [place], [place] [address] [phone]

## 2015-09-10 DIAGNOSIS — N3941 Urge incontinence: Secondary | ICD-10-CM | POA: Insufficient documentation

## 2015-09-10 DIAGNOSIS — N200 Calculus of kidney: Secondary | ICD-10-CM | POA: Insufficient documentation

## 2015-09-10 DIAGNOSIS — R351 Nocturia: Secondary | ICD-10-CM | POA: Insufficient documentation

## 2015-09-11 ENCOUNTER — Telehealth: Payer: Self-pay

## 2015-09-11 NOTE — Telephone Encounter (Signed)
-----   Message from Nori Riis, PA-C sent at 09/06/2015  4:38 PM EDT ----- Patient has a small stone in her left kidney.  I will be seeing her again in three weeks.

## 2015-09-11 NOTE — Telephone Encounter (Signed)
Spoke with pt in reference to stone and f/u appt. Pt voiced understanding.

## 2015-09-21 DIAGNOSIS — G4733 Obstructive sleep apnea (adult) (pediatric): Secondary | ICD-10-CM | POA: Insufficient documentation

## 2015-09-25 ENCOUNTER — Encounter: Payer: Self-pay | Admitting: Urology

## 2015-09-25 ENCOUNTER — Ambulatory Visit (INDEPENDENT_AMBULATORY_CARE_PROVIDER_SITE_OTHER): Payer: BLUE CROSS/BLUE SHIELD | Admitting: Urology

## 2015-09-25 VITALS — BP 121/88 | HR 81 | Ht 66.0 in | Wt 249.4 lb

## 2015-09-25 DIAGNOSIS — R351 Nocturia: Secondary | ICD-10-CM

## 2015-09-25 DIAGNOSIS — N3941 Urge incontinence: Secondary | ICD-10-CM | POA: Diagnosis not present

## 2015-09-25 DIAGNOSIS — N200 Calculus of kidney: Secondary | ICD-10-CM | POA: Diagnosis not present

## 2015-09-25 LAB — BLADDER SCAN AMB NON-IMAGING: Scan Result: 108

## 2015-09-25 NOTE — Progress Notes (Signed)
9:18 AM   Sharon [name] 08-Dec-1960 [id number]  Referring provider: No referring provider defined for this encounter.  Chief Complaint  Patient presents with  . Urge Incontinence    3 week follow up    HPI: Patient is a 55 year old Caucasian female who presents today after a three week trial of Toviaz 4 mg daily for urge incontinence.    Kidney stone Patient has been having lower back pain and is wondering if it could be her stone.  Recent KUB noted a small left renal calculus.  She has not had gross hematuria or left flank pain.  She denies any fevers, chills, nausea or vomiting.  The pain is worse in the morning and eases off during the day.    Nocturia Patient states she is still getting up three times nightly.  She has undergone a sleep study and most likely has sleep apnea as she has to return for an additional sleep study.  She has not received the formal report at this time.  Urge incontinence She does states that over the last 3-4 months, she has been experiencing urgency, urge incontinence and nocturia.  She states that she is making 5 trips to the bathroom during the day as experiencing 3-4 episodes of nocturia.  She is not losing urine if she laughs, coughs or sneezes.  She has not had any urinary tract infections and does not feel she is not emptying her bladder.  She found just a very mild improvement with her symptoms with the Toviaz 4 mg daily.  She did experience constipation, but she changed her Linzess dose to the morning and this relieved the constipation.    Her PVR was 116mL.    PMH: Past Medical History  Diagnosis Date  . Kidney calculi   . Leg DVT (deep venous thromboembolism), acute ([place])   . Acid reflux   . Left ureteral stone   . Bladder spasm   . Urge incontinence     Surgical History: Past Surgical History  Procedure Laterality Date  . Kidney stone surgery    . Abdominal hysterectomy    . Cholecystectomy    . Knee arthroscopy      . Sinus exploration      Home Medications:    Medication List       This list is accurate as of: 09/25/15  9:18 AM.  Always use your most recent med list.               benzonatate 100 MG capsule  Commonly known as:  TESSALON  Take 2 capsules (200 mg total) by mouth every 8 (eight) hours.     cyclobenzaprine 10 MG tablet  Commonly known as:  FLEXERIL  TAKE 1 TABLET (10 MG TOTAL) BY MOUTH 3 (THREE) TIMES DAILY AS NEEDED.     doxepin 10 MG capsule  Commonly known as:  SINEQUAN  Take 10 mg by mouth.     EPIPEN 2-PAK 0.3 mg/0.3 mL Soaj injection  Generic drug:  EPINEPHrine     famotidine 40 MG tablet  Commonly known as:  PEPCID  Take 40 mg by mouth daily. Reported on 09/25/2015     fluticasone 50 MCG/ACT nasal spray  Commonly known as:  FLONASE  1-2 SPRAYS ONCE A DAY NASALLY 30 DAYS     ibuprofen 200 MG tablet  Commonly known as:  ADVIL,MOTRIN  Take by mouth. Reported on 09/06/2015     levocetirizine 5 MG tablet  Commonly known as:  XYZAL  Take 5 mg by mouth every evening.     LINZESS 290 MCG Caps capsule  Generic drug:  linaclotide  TAKE ONE CAPSULE BY MOUTH BEFORE FIRST MEAL OF THE DAY     montelukast 10 MG tablet  Commonly known as:  SINGULAIR  Take 10 mg by mouth at bedtime.     mupirocin ointment 2 %  Commonly known as:  BACTROBAN  Reported on 09/25/2015     PATADAY 0.2 % Soln  Generic drug:  Olopatadine HCl     phentermine 37.5 MG tablet  Commonly known as:  ADIPEX-P  Take by mouth. Reported on 09/25/2015        Allergies:  Allergies  Allergen Reactions  . Alcohol Other (See Comments)  . Bacitracin-Neomycin-Polymyxin Other (See Comments)  . Codeine   . Hydrocodone Other (See Comments)  . Hydrocortisone Other (See Comments)  . Lanolin Other (See Comments)  . Nickel Other (See Comments)  . Penicillins   . Tape Other (See Comments)  . Bacitracin Rash  . Clarithromycin Rash  . Latex Rash  . Oxycodone Rash  . Sulfamethoxazole-Trimethoprim Rash     Family History: Family History  Problem Relation Age of Onset  . Cancer Mother   . Lung cancer Father   . Hematuria Brother   . Kidney disease Paternal Grandfather   . Bladder Cancer Neg Hx   . Kidney Stones Brother     Social History:  reports that she has never smoked. She does not have any smokeless tobacco history on file. She reports that she does not drink alcohol or use illicit drugs.  ROS: UROLOGY Frequent Urination?: No Hard to postpone urination?: No Burning/pain with urination?: No Get up at night to urinate?: Yes Leakage of urine?: Yes Urine stream starts and stops?: No Trouble starting stream?: No Do you have to strain to urinate?: No Blood in urine?: No Urinary tract infection?: No Sexually transmitted disease?: No Injury to kidneys or bladder?: No Painful intercourse?: No Weak stream?: No Currently pregnant?: No Vaginal bleeding?: No Last menstrual period?: n  Gastrointestinal Nausea?: No Vomiting?: No Indigestion/heartburn?: Yes Diarrhea?: No Constipation?: No  Constitutional Fever: No Night sweats?: No Weight loss?: No Fatigue?: No  Skin Skin rash/lesions?: No Itching?: No  Eyes Blurred vision?: No Double vision?: No  Ears/Nose/Throat Sore throat?: No Sinus problems?: Yes  Hematologic/Lymphatic Swollen glands?: No Easy bruising?: No  Cardiovascular Leg swelling?: No Chest pain?: No  Respiratory Cough?: No Shortness of breath?: No  Endocrine Excessive thirst?: No  Musculoskeletal Back pain?: Yes Joint pain?: No  Neurological Headaches?: Yes Dizziness?: No  Psychologic Depression?: No Anxiety?: No  Physical Exam: BP 121/88 mmHg  Pulse 81  Ht 5\' 6"  (1.676 m)  Wt 249 lb 6.4 oz (113.127 kg)  BMI 40.27 kg/m2  Constitutional: Well nourished. Alert and oriented, No acute distress. HEENT: [place] AT, moist mucus membranes. Trachea midline, no masses. Cardiovascular: No clubbing, cyanosis, or edema. Respiratory:  Normal respiratory effort, no increased work of breathing. Skin: No rashes, bruises or suspicious lesions. Lymph: No cervical or inguinal adenopathy. Neurologic: Grossly intact, no focal deficits, moving all 4 extremities. Psychiatric: Normal mood and affect.  Laboratory Data: Lab Results  Component Value Date   WBC 20.4* 06/21/2014   HGB 11.7* 06/21/2014   HCT 35.1 06/21/2014   MCV 86 06/21/2014   PLT 150 06/21/2014    Lab Results  Component Value Date   CREATININE 0.91 06/21/2014    Lab Results  Component Value Date   AST 300* 06/20/2014   Lab Results  Component Value Date   ALT 186* 06/20/2014     Pertinent imaging Results for orders placed or performed in visit on 09/25/15  BLADDER SCAN AMB NON-IMAGING  Result Value Ref Range   Scan Result 108     Pertinent Imaging: CLINICAL DATA: History of renal calculi.  EXAM: ABDOMEN - 1 VIEW  COMPARISON: CT abdomen and pelvis June 20, 2014  FINDINGS: No ureteral calculi are identified. There remain small calculi in the lower pole the left kidney, largest measuring 4 x 2 mm. None there are probable phleboliths in the lower left pelvis. There is moderate stool in the colon. There is no bowel dilatation or air-fluid level suggesting obstruction. No free air. There are surgical clips in the right upper quadrant region.  IMPRESSION: Small calculi lower pole left kidney. No ureteral calculus evident. Probable phleboliths in the pelvis. Bowel gas pattern overall unremarkable.   Electronically Signed  By: Lowella Grip III M.D.  On: 09/06/2015 12:23   Assessment & Plan:    1. Kidney stones:   Lower back pain is most likely musculoskeletal in nature.  Patient has a small calculi in the lower pole left kidney.  We will continue to monitor.  She will have yearly KUB's.  She will contact the office or seek treatment in the emergency room if she should develop flank pain or gross hematuria.   2. Urge  incontinence:   She did not find the Toviaz 4 mg daily helpful in controlling her urge incontinence  I discussed with the patient the options of increasing the dosage of the Toviaz, trying another OAB agent, PTNS or physical therapy.   She would like to try an increased dose of the Toviaz.    I have given her samples of Toviaz 8 mg and Toviaz 4 mg to double up on.     - She will return in 3 weeks for PVR and symptom recheck  3. Nocturia:   Patient was found to have sleep apnea and will be returning for a CPAP titration study.    Return in about 3 weeks (around 10/16/2015) for PVR and symptom recheck.  These notes generated with voice recognition software. I apologize for typographical errors.  Zara Council, [facility] [address], [place], [place] [address] [phone]

## 2015-10-06 ENCOUNTER — Ambulatory Visit
Admission: EM | Admit: 2015-10-06 | Discharge: 2015-10-06 | Disposition: A | Discharge status: Home / Self Care | Payer: BLUE CROSS/BLUE SHIELD | Attending: Family Medicine | Admitting: Family Medicine

## 2015-10-06 DIAGNOSIS — J301 Allergic rhinitis due to pollen: Secondary | ICD-10-CM | POA: Diagnosis not present

## 2015-10-06 DIAGNOSIS — H6593 Unspecified nonsuppurative otitis media, bilateral: Secondary | ICD-10-CM | POA: Diagnosis not present

## 2015-10-06 DIAGNOSIS — J0101 Acute recurrent maxillary sinusitis: Secondary | ICD-10-CM | POA: Diagnosis not present

## 2015-10-06 MED ORDER — FLUTICASONE PROPIONATE 50 MCG/ACT NA SUSP: 1.0000 | Freq: Two times a day (BID) | NASAL | Status: DC

## 2015-10-06 MED ORDER — DOXYCYCLINE HYCLATE 100 MG PO CAPS: 100.0000 mg | ORAL_CAPSULE | Freq: Two times a day (BID) | ORAL | Status: DC

## 2015-10-06 MED ORDER — SALINE SPRAY 0.65 % NA SOLN: 2.0000 | NASAL | Status: DC

## 2015-10-06 NOTE — ED Notes (Signed)
Pt went to [place] now has sinus infection, cough, ear pain Right is worst, mild HA, no chills or fever onset week.

## 2015-10-06 NOTE — Discharge Instructions (Signed)
Allergic Rhinitis Allergic rhinitis is when the mucous membranes in the nose respond to allergens. Allergens are particles in the air that cause your body to have an allergic reaction. This causes you to release allergic antibodies. Through a chain of events, these eventually cause you to release histamine into the blood stream. Although meant to protect the body, it is this release of histamine that causes your discomfort, such as frequent sneezing, congestion, and an itchy, runny nose.  CAUSES Seasonal allergic rhinitis (hay fever) is caused by pollen allergens that may come from grasses, trees, and weeds. Year-round allergic rhinitis (perennial allergic rhinitis) is caused by allergens such as house dust mites, pet dander, and mold spores. SYMPTOMS  Nasal stuffiness (congestion).  Itchy, runny nose with sneezing and tearing of the eyes. DIAGNOSIS Your health care provider can help you determine the allergen or allergens that trigger your symptoms. If you and your health care provider are unable to determine the allergen, skin or blood testing may be used. Your health care provider will diagnose your condition after taking your health history and performing a physical exam. Your health care provider may assess you for other related conditions, such as asthma, pink eye, or an ear infection. TREATMENT Allergic rhinitis does not have a cure, but it can be controlled by:  Medicines that block allergy symptoms. These may include allergy shots, nasal sprays, and oral antihistamines.  Avoiding the allergen. Hay fever may often be treated with antihistamines in pill or nasal spray forms. Antihistamines block the effects of histamine. There are over-the-counter medicines that may help with nasal congestion and swelling around the eyes. Check with your health care provider before taking or giving this medicine. If avoiding the allergen or the medicine prescribed do not work, there are many new medicines  your health care provider can prescribe. Stronger medicine may be used if initial measures are ineffective. Desensitizing injections can be used if medicine and avoidance does not work. Desensitization is when a patient is given ongoing shots until the body becomes less sensitive to the allergen. Make sure you follow up with your health care provider if problems continue. HOME CARE INSTRUCTIONS It is not possible to completely avoid allergens, but you can reduce your symptoms by taking steps to limit your exposure to them. It helps to know exactly what you are allergic to so that you can avoid your specific triggers. SEEK MEDICAL CARE IF:  You have a fever.  You develop a cough that does not stop easily (persistent).  You have shortness of breath.  You start wheezing.  Symptoms interfere with normal daily activities.   This information is not intended to replace advice given to you by your health care provider. Make sure you discuss any questions you have with your health care provider.   Document Released: 01/28/2001 Document Revised: 05/26/2014 Document Reviewed: 01/10/2013 Elsevier Interactive Patient Education 2016 [place]. Otitis Media With Effusion Otitis media with effusion is the presence of fluid in the middle ear. This is a common problem in children, which often follows ear infections. It may be present for weeks or longer after the infection. Unlike an acute ear infection, otitis media with effusion refers only to fluid behind the ear drum and not infection. Children with repeated ear and sinus infections and allergy problems are the most likely to get otitis media with effusion. CAUSES  The most frequent cause of the fluid buildup is dysfunction of the eustachian tubes. These are the tubes that drain fluid  in the ears to the back of the nose (nasopharynx). SYMPTOMS   The main symptom of this condition is hearing loss. As a result, you or your child may:  Listen to the TV  at a loud volume.  Not respond to questions.  Ask "what" often when spoken to.  Mistake or confuse one sound or word for another.  There may be a sensation of fullness or pressure but usually not pain. DIAGNOSIS   Your health care provider will diagnose this condition by examining you or your child's ears.  Your health care provider may test the pressure in you or your child's ear with a tympanometer.  A hearing test may be conducted if the problem persists. TREATMENT   Treatment depends on the duration and the effects of the effusion.  Antibiotics, decongestants, nose drops, and cortisone-type drugs (tablets or nasal spray) may not be helpful.  Children with persistent ear effusions may have delayed language or behavioral problems. Children at risk for developmental delays in hearing, learning, and speech may require referral to a specialist earlier than children not at risk.  You or your child's health care provider may suggest a referral to an ear, nose, and throat surgeon for treatment. The following may help restore normal hearing:  Drainage of fluid.  Placement of ear tubes (tympanostomy tubes).  Removal of adenoids (adenoidectomy). HOME CARE INSTRUCTIONS   Avoid secondhand smoke.  Infants who are breastfed are less likely to have this condition.  Avoid feeding infants while they are lying flat.  Avoid known environmental allergens.  Avoid people who are sick. SEEK MEDICAL CARE IF:   Hearing is not better in 3 months.  Hearing is worse.  Ear pain.  Drainage from the ear.  Dizziness. MAKE SURE YOU:   Understand these instructions.  Will watch your condition.  Will get help right away if you are not doing well or get worse.   This information is not intended to replace advice given to you by your health care provider. Make sure you discuss any questions you have with your health care provider.   Document Released: 06/12/2004 Document Revised:  05/26/2014 Document Reviewed: 11/30/2012 Elsevier Interactive Patient Education 2016 [employer] Sinusitis, Adult Sinusitis is redness, soreness, and inflammation of the paranasal sinuses. Paranasal sinuses are air pockets within the bones of your face. They are located beneath your eyes, in the middle of your forehead, and above your eyes. In healthy paranasal sinuses, mucus is able to drain out, and air is able to circulate through them by way of your nose. However, when your paranasal sinuses are inflamed, mucus and air can become trapped. This can allow bacteria and other germs to grow and cause infection. Sinusitis can develop quickly and last only a short time (acute) or continue over a long period (chronic). Sinusitis that lasts for more than 12 weeks is considered chronic. CAUSES Causes of sinusitis include:  Allergies.  Structural abnormalities, such as displacement of the cartilage that separates your nostrils (deviated septum), which can decrease the air flow through your nose and sinuses and affect sinus drainage.  Functional abnormalities, such as when the small hairs (cilia) that line your sinuses and help remove mucus do not work properly or are not present. SIGNS AND SYMPTOMS Symptoms of acute and chronic sinusitis are the same. The primary symptoms are pain and pressure around the affected sinuses. Other symptoms include:  Upper toothache.  Earache.  Headache.  Bad breath.  Decreased sense of smell and taste.  A cough, which worsens when you are lying flat.  Fatigue.  Fever.  Thick drainage from your nose, which often is green and may contain pus (purulent).  Swelling and warmth over the affected sinuses. DIAGNOSIS Your health care provider will perform a physical exam. During your exam, your health care provider may perform any of the following to help determine if you have acute sinusitis or chronic sinusitis:  Look in your nose for signs of abnormal  growths in your nostrils (nasal polyps).  Tap over the affected sinus to check for signs of infection.  View the inside of your sinuses using an imaging device that has a light attached (endoscope). If your health care provider suspects that you have chronic sinusitis, one or more of the following tests may be recommended:  Allergy tests.  Nasal culture. A sample of mucus is taken from your nose, sent to a lab, and screened for bacteria.  Nasal cytology. A sample of mucus is taken from your nose and examined by your health care provider to determine if your sinusitis is related to an allergy. TREATMENT Most cases of acute sinusitis are related to a viral infection and will resolve on their own within 10 days. Sometimes, medicines are prescribed to help relieve symptoms of both acute and chronic sinusitis. These may include pain medicines, decongestants, nasal steroid sprays, or saline sprays. However, for sinusitis related to a bacterial infection, your health care provider will prescribe antibiotic medicines. These are medicines that will help kill the bacteria causing the infection. Rarely, sinusitis is caused by a fungal infection. In these cases, your health care provider will prescribe antifungal medicine. For some cases of chronic sinusitis, surgery is needed. Generally, these are cases in which sinusitis recurs more than 3 times per year, despite other treatments. HOME CARE INSTRUCTIONS  Drink plenty of water. Water helps thin the mucus so your sinuses can drain more easily.  Use a humidifier.  Inhale steam 3-4 times a day (for example, sit in the bathroom with the shower running).  Apply a warm, moist washcloth to your face 3-4 times a day, or as directed by your health care provider.  Use saline nasal sprays to help moisten and clean your sinuses.  Take medicines only as directed by your health care provider.  If you were prescribed either an antibiotic or antifungal medicine,  finish it all even if you start to feel better. SEEK IMMEDIATE MEDICAL CARE IF:  You have increasing pain or severe headaches.  You have nausea, vomiting, or drowsiness.  You have swelling around your face.  You have vision problems.  You have a stiff neck.  You have difficulty breathing.   This information is not intended to replace advice given to you by your health care provider. Make sure you discuss any questions you have with your health care provider.   Document Released: 05/05/2005 Document Revised: 05/26/2014 Document Reviewed: 05/20/2011 Elsevier Interactive Patient Education [employer].

## 2015-10-06 NOTE — ED Provider Notes (Signed)
CSN: [id number]     Arrival date & time 10/06/15  0930 History   First MD Initiated Contact with Patient 10/06/15 [id number]     Chief Complaint  Patient presents with  . Sinusitis   (Consider location/radiation/quality/duration/timing/severity/associated sxs/prior Treatment) HPI Comments: Established patient Married caucasian female here for evaluation right greater than left ear pain, sinus pressure, headache.  Last sinus infection a couple years ago hadn't had one since starting allergy shots.  Daughter graduated last week and took trip to [place]/air flight ear pain and congestion (15 May).  Weather was initially cool [place] and then hot.  [place]/known trigger.  Noticed increased post nasal drip 12 May. Got scratchy throat and itchy eyes after allergy shot last week.  Tried tylenol cold and sinus without any relief of symptoms.  Dark green runny nose mucous the past couple days.  PSHx hysterectomy  The history is provided by the patient.    Past Medical History  Diagnosis Date  . Kidney calculi   . Leg DVT (deep venous thromboembolism), acute ([place])   . Acid reflux   . Left ureteral stone   . Bladder spasm   . Urge incontinence    Past Surgical History  Procedure Laterality Date  . Kidney stone surgery    . Abdominal hysterectomy    . Cholecystectomy    . Knee arthroscopy    . Sinus exploration     Family History  Problem Relation Age of Onset  . Cancer Mother   . Lung cancer Father   . Hematuria Brother   . Kidney disease Paternal Grandfather   . Bladder Cancer Neg Hx   . Kidney Stones Brother    Social History  Substance Use Topics  . Smoking status: Never Smoker   . Smokeless tobacco: None  . Alcohol Use: No   OB History    No data available     Review of Systems  Constitutional: Negative for fever, chills, diaphoresis, activity change, appetite change, fatigue and unexpected weight change.  HENT: Positive for congestion,  ear pain, postnasal drip, rhinorrhea, sinus pressure and sneezing. Negative for dental problem, drooling, ear discharge, facial swelling, hearing loss, mouth sores, nosebleeds, sore throat, tinnitus, trouble swallowing and voice change.   Eyes: Positive for itching. Negative for photophobia, pain, discharge, redness and visual disturbance.  Respiratory: Negative for cough, choking, chest tightness, shortness of breath, wheezing and stridor.   Cardiovascular: Negative for chest pain, palpitations and leg swelling.  Gastrointestinal: Negative for nausea, vomiting, abdominal pain, diarrhea, constipation, blood in stool and abdominal distention.  Endocrine: Negative for cold intolerance and heat intolerance.  Genitourinary: Negative for dysuria, hematuria and difficulty urinating.  Musculoskeletal: Negative for myalgias, back pain, joint swelling, arthralgias, gait problem, neck pain and neck stiffness.  Skin: Negative for color change, pallor, rash and wound.  Allergic/Immunologic: Positive for environmental allergies. Negative for food allergies.  Neurological: Positive for headaches. Negative for dizziness, tremors, seizures, syncope, facial asymmetry, speech difficulty, weakness, light-headedness and numbness.  Hematological: Negative for adenopathy. Does not bruise/bleed easily.  Psychiatric/Behavioral: Negative for behavioral problems, confusion, sleep disturbance and agitation.    Allergies  Alcohol; Bacitracin-neomycin-polymyxin; Codeine; Hydrocodone; Hydrocortisone; Lanolin; Nickel; Penicillins; Tape; Bacitracin; Clarithromycin; Latex; Oxycodone; and Sulfamethoxazole-trimethoprim  Home Medications   Prior to Admission medications   Medication Sig Start Date End Date Taking? Authorizing Provider  cyclobenzaprine (FLEXERIL) 10 MG tablet TAKE 1 TABLET (10 MG TOTAL) BY MOUTH 3 (THREE) TIMES DAILY AS  NEEDED. 07/30/15  Yes Historical Provider, MD  doxepin (SINEQUAN) 10 MG capsule Take 10 mg by  mouth.   Yes Historical Provider, MD  EPINEPHrine (EPIPEN 2-PAK) 0.3 mg/0.3 mL IJ SOAJ injection  07/21/13  Yes Historical Provider, MD  fluticasone (FLONASE) 50 MCG/ACT nasal spray 1-2 SPRAYS ONCE A DAY NASALLY 30 DAYS 07/22/15  Yes Historical Provider, MD  levocetirizine (XYZAL) 5 MG tablet Take 5 mg by mouth every evening.   Yes Historical Provider, MD  LINZESS 290 MCG CAPS capsule TAKE ONE CAPSULE BY MOUTH BEFORE FIRST MEAL OF THE DAY 07/27/15  Yes Historical Provider, MD  montelukast (SINGULAIR) 10 MG tablet Take 10 mg by mouth at bedtime.   Yes Historical Provider, MD  mupirocin ointment (BACTROBAN) 2 % Reported on 09/25/2015 10/24/13  Yes Historical Provider, MD  Olopatadine HCl (PATADAY) 0.2 % SOLN  07/21/13  Yes Historical Provider, MD  doxycycline (VIBRAMYCIN) 100 MG capsule Take 1 capsule (100 mg total) by mouth 2 (two) times daily. 10/06/15   Olen Cordial, NP  fluticasone (FLONASE) 50 MCG/ACT nasal spray Place 1 spray into both nostrils 2 (two) times daily. 10/06/15   Olen Cordial, NP  sodium chloride (OCEAN) 0.65 % SOLN nasal spray Place 2 sprays into both nostrils every 2 (two) hours while awake. 10/06/15   Olen Cordial, NP   Meds Ordered and Administered this Visit  Medications - No data to display  BP 139/74 mmHg  Pulse 81  Temp(Src) 98 F (36.7 C) (Oral)  Ht 5' 6.5" (1.689 m)  Wt 249 lb (112.946 kg)  BMI 39.59 kg/m2  SpO2 96% No data found.   Physical Exam  Constitutional: She is oriented to person, place, and time. Vital signs are normal. She appears well-developed and well-nourished. She is active and cooperative.  Non-toxic appearance. She does not have a sickly appearance. She appears ill. No distress.  HENT:  Head: Normocephalic and atraumatic.  Right Ear: Hearing, external ear and ear canal normal. A middle ear effusion is present.  Left Ear: Hearing, external ear and ear canal normal. A middle ear effusion is present.  Nose: Mucosal edema and rhinorrhea  present. No nose lacerations, sinus tenderness, nasal deformity, septal deviation or nasal septal hematoma. No epistaxis.  No foreign bodies. Right sinus exhibits maxillary sinus tenderness. Right sinus exhibits no frontal sinus tenderness. Left sinus exhibits maxillary sinus tenderness. Left sinus exhibits no frontal sinus tenderness.  Mouth/Throat: Uvula is midline and mucous membranes are normal. Mucous membranes are not pale, not dry and not cyanotic. She does not have dentures. No oral lesions. No trismus in the jaw. Normal dentition. No dental abscesses, uvula swelling, lacerations or dental caries. Posterior oropharyngeal edema and posterior oropharyngeal erythema present. No oropharyngeal exudate or tonsillar abscesses.  Cobblestoning posterior pharynx; bilateral Tms with air fluid level clear; bilateral nasal turbinates boggy/edema; bilateral allergic shiners and edema below eyes  Eyes: EOM and lids are normal. Pupils are equal, round, and reactive to light. Right eye exhibits no chemosis, no discharge, no exudate and no hordeolum. No foreign body present in the right eye. Left eye exhibits no chemosis, no discharge, no exudate and no hordeolum. No foreign body present in the left eye. Right conjunctiva is injected. Right conjunctiva has no hemorrhage. Left conjunctiva is injected. Left conjunctiva has no hemorrhage. No scleral icterus. Right eye exhibits normal extraocular motion and no nystagmus. Left eye exhibits normal extraocular motion and no nystagmus. Right pupil is round and reactive. Left pupil is round  and reactive. Pupils are equal.  2+ bulbar and eyelid conjunctiva injection  Neck: Trachea normal and normal range of motion. Neck supple. No tracheal tenderness, no spinous process tenderness and no muscular tenderness present. No rigidity. No tracheal deviation, no edema, no erythema and normal range of motion present. No thyroid mass and no thyromegaly present.  Cardiovascular: Normal  rate, regular rhythm, S1 normal, S2 normal, normal heart sounds and intact distal pulses.  PMI is not displaced.  Exam reveals no gallop and no friction rub.   No murmur heard. Pulmonary/Chest: Effort normal and breath sounds normal. No accessory muscle usage or stridor. No respiratory distress. She has no decreased breath sounds. She has no wheezes. She has no rhonchi. She has no rales. She exhibits no tenderness.  Abdominal: Soft. She exhibits no distension.  Musculoskeletal: Normal range of motion. She exhibits no edema or tenderness.       Right shoulder: Normal.       Left shoulder: Normal.       Right hip: Normal.       Left hip: Normal.       Right knee: Normal.       Left knee: Normal.       Cervical back: Normal.       Right hand: Normal.       Left hand: Normal.  Lymphadenopathy:       Head (right side): No submental, no submandibular, no tonsillar, no preauricular, no posterior auricular and no occipital adenopathy present.       Head (left side): No submental, no submandibular, no tonsillar, no preauricular, no posterior auricular and no occipital adenopathy present.    She has no cervical adenopathy.       Right cervical: No superficial cervical, no deep cervical and no posterior cervical adenopathy present.      Left cervical: No superficial cervical, no deep cervical and no posterior cervical adenopathy present.  Neurological: She is alert and oriented to person, place, and time. She has normal strength. She is not disoriented. She displays no atrophy and no tremor. No cranial nerve deficit or sensory deficit. She exhibits normal muscle tone. She displays no seizure activity. Coordination and gait normal. GCS eye subscore is 4. GCS verbal subscore is 5. GCS motor subscore is 6.  Skin: Skin is warm, dry and intact. No abrasion, no bruising, no burn, no ecchymosis, no laceration, no lesion, no petechiae and no rash noted. She is not diaphoretic. No cyanosis or erythema. No pallor.  Nails show no clubbing.  Psychiatric: She has a normal mood and affect. Her speech is normal and behavior is normal. Judgment and thought content normal. Cognition and memory are normal.  Nursing note and vitals reviewed.   ED Course  Procedures (including critical care time)  Labs Review Labs Reviewed - No data to display  Imaging Review No results found.    MDM   1. Acute recurrent maxillary sinusitis   2. Allergic rhinitis due to pollen   3. Otitis media with effusion, bilateral    Patient may use normal saline nasal spray as needed.  Continue xyzal po daily and restart flonase 1 spray each nostril BID and nasal saline 2 sprays each nostril q2h prn congestion at least BID.  Notify allergist office that she had to start nasal steroid and antibiotic as had reaction to immunotherapy shot last week per patient also  Avoid triggers if possible.  Shower prior to bedtime if exposed to triggers.  If allergic  dust/dust mites recommend mattress/pillow covers/encasements; washing linens, vacuuming, sweeping, dusting weekly.  Call or return to clinic as needed if these symptoms worsen or fail to improve as anticipated.   Exitcare handout on allergic rhinitis given to patient.  Patient verbalized understanding of instructions, agreed with plan of care and had no further questions at this time.  P2:  Avoidance and hand washing.  Supportive treatment.   No evidence of invasive bacterial infection, non toxic and well hydrated.  This is most likely self limiting viral infection.  I do not see where any further testing or imaging is necessary at this time.   I will suggest supportive care, rest, good hygiene and encourage the patient to take adequate fluids.  The patient is to return to clinic or EMERGENCY ROOM if symptoms worsen or change significantly e.g. ear pain, fever, purulent discharge from ears or bleeding.  Exitcare handout on otitis media with effusion given to patient.  Patient verbalized  agreement and understanding of treatment plan.    Patient notified rapid strep negative.  Suspect Viral illness: no evidence of invasive bacterial infection, non toxic and well hydrated.  This is most likely self limiting viral infection.  I do not see where any further testing or imaging is necessary at this time.   I will suggest supportive care, rest, good hygiene and encourage the patient to take adequate fluids.  Does not require work excuse.  flonase 1 spray each nostril BID prn, nasal saline 1-2 sprays each nostril prn q2h, tylenol 1000mg  po QID prn pain.  Discussed honey with lemon and salt water gargles for comfort also.  The patient is to return to clinic or EMERGENCY ROOM if symptoms worsen or change significantly e.g. fever, lethargy, SOB, wheezing.  Exitcare handout on viral illness given to patient.  Patient verbalized agreement and understanding of treatment plan.    Restart flonase 1 spray each nostril BID, saline 2 sprays each nostril q2h prn congestion.  If no improvement with 48 hours of saline and flonase use start doxycycline 100mg  po BID x 10 days.  Rx given.  No evidence of systemic bacterial infection, non toxic and well hydrated.  I do not see where any further testing or imaging is necessary at this time.   I will suggest supportive care, rest, good hygiene and encourage the patient to take adequate fluids.  The patient is to return to clinic or EMERGENCY ROOM if symptoms worsen or change significantly.  Exitcare handout on sinusitis given to patient.  Patient verbalized agreement and understanding of treatment plan and had no further questions at this time.   P2:  Hand washing and cover cough        Olen Cordial, NP 10/06/15 1019

## 2015-10-16 ENCOUNTER — Ambulatory Visit
Admission: EM | Admit: 2015-10-16 | Discharge: 2015-10-16 | Disposition: A | Discharge status: Home / Self Care | Payer: BLUE CROSS/BLUE SHIELD | Attending: Internal Medicine | Admitting: Internal Medicine

## 2015-10-16 ENCOUNTER — Encounter: Payer: Self-pay | Admitting: Emergency Medicine

## 2015-10-16 DIAGNOSIS — J019 Acute sinusitis, unspecified: Secondary | ICD-10-CM

## 2015-10-16 MED ORDER — LEVOFLOXACIN 500 MG PO TABS: 500.0000 mg | ORAL_TABLET | Freq: Every day | ORAL | Status: DC

## 2015-10-16 NOTE — Discharge Instructions (Signed)
Prescription for levofloxacin (levaquin) sent to the [facility] in [place], for persistent sinus drainage/congestion.  Continue flonase.  Recheck as needed.

## 2015-10-16 NOTE — ED Notes (Signed)
Was seen 10/06/2015 for sinusitis, and allergic rhinitis. Feels better still have sinus headache, ears feels stopped up. Surgery in 4 weeks

## 2015-10-16 NOTE — ED Provider Notes (Signed)
CSN: [id number]     Arrival date & time 10/16/15  1656 History   First MD Initiated Contact with Patient 10/16/15 1822     Chief Complaint  Patient presents with  . Facial Pain   HPI  55yo lady with recent visit to [facility] 5/20 with sinusitis/otitis symptoms, finished rx for doxycycline in the last 24hrs.   Feels about 75% better, has residual sinus congestion/facial pain, sinus drainage.   Very concerned about residual sx's, has orthopedic surgery scheduled 11/2015, daughter has to take off work and come take care of her post op, worried surgery will be postponed or cancelled if residual sinusitis.   No fever, no malaise.  No cough, no sore throat.  No N/V.    Past Medical History  Diagnosis Date  . Kidney calculi   . Leg DVT (deep venous thromboembolism), acute ([place])   . Acid reflux   . Left ureteral stone   . Bladder spasm   . Urge incontinence    Past Surgical History  Procedure Laterality Date  . Kidney stone surgery    . Abdominal hysterectomy    . Cholecystectomy    . Knee arthroscopy    . Sinus exploration     Family History  Problem Relation Age of Onset  . Cancer Mother   . Lung cancer Father   . Hematuria Brother   . Kidney disease Paternal Grandfather   . Bladder Cancer Neg Hx   . Kidney Stones Brother    Social History  Substance Use Topics  . Smoking status: Never Smoker   . Smokeless tobacco: None  . Alcohol Use: No    Review of Systems  All other systems reviewed and are negative.   Allergies  Alcohol; Bacitracin-neomycin-polymyxin; Codeine; Hydrocodone; Hydrocortisone; Lanolin; Nickel; Penicillins; Tape; Bacitracin; Clarithromycin; Latex; Oxycodone; and Sulfamethoxazole-trimethoprim  Home Medications   Prior to Admission medications   Medication Sig Start Date End Date Taking? Authorizing Provider  cyclobenzaprine (FLEXERIL) 10 MG tablet TAKE 1 TABLET (10 MG TOTAL) BY MOUTH 3 (THREE) TIMES DAILY AS NEEDED. 07/30/15   Historical Provider, MD  doxepin  (SINEQUAN) 10 MG capsule Take 10 mg by mouth.    Historical Provider, MD  doxycycline (VIBRAMYCIN) 100 MG capsule Take 1 capsule (100 mg total) by mouth 2 (two) times daily. 10/06/15   Olen Cordial, NP  EPINEPHrine (EPIPEN 2-PAK) 0.3 mg/0.3 mL IJ SOAJ injection  07/21/13   Historical Provider, MD  fluticasone (FLONASE) 50 MCG/ACT nasal spray 1-2 SPRAYS ONCE A DAY NASALLY 30 DAYS 07/22/15   Historical Provider, MD  fluticasone (FLONASE) 50 MCG/ACT nasal spray Place 1 spray into both nostrils 2 (two) times daily. 10/06/15   Olen Cordial, NP  levocetirizine (XYZAL) 5 MG tablet Take 5 mg by mouth every evening.    Historical Provider, MD  LINZESS 290 MCG CAPS capsule TAKE ONE CAPSULE BY MOUTH BEFORE FIRST MEAL OF THE DAY 07/27/15   Historical Provider, MD  montelukast (SINGULAIR) 10 MG tablet Take 10 mg by mouth at bedtime.    Historical Provider, MD  mupirocin ointment (BACTROBAN) 2 % Reported on 09/25/2015 10/24/13   Historical Provider, MD  Olopatadine HCl (PATADAY) 0.2 % SOLN  07/21/13   Historical Provider, MD  sodium chloride (OCEAN) 0.65 % SOLN nasal spray Place 2 sprays into both nostrils every 2 (two) hours while awake. 10/06/15   Aura Fey Betancourt, NP      BP 137/68 mmHg  Pulse 77  Temp(Src) 97.3 F (36.3 C) (Oral)  Resp 16  Ht 5\' 6"  (1.676 m)  Wt 250 lb (113.399 kg)  BMI 40.37 kg/m2  SpO2 97% Physical Exam  Constitutional: She is oriented to person, place, and time.  Alert, nicely groomed Voice sounds quite congested  HENT:  Head: Atraumatic.  B TMs quite dull, no erythema Mod to marked nasal congestion bilaterally Throat pink with post nasal drainage evident  Eyes:  Conjugate gaze, no eye redness/drainage  Neck: Neck supple.  Cardiovascular: Normal rate and regular rhythm.   Pulmonary/Chest: No respiratory distress. She has no wheezes. She has no rales.  Lungs clear, symmetric breath sounds  Abdominal: She exhibits no distension.  Musculoskeletal: Normal range of motion.   No leg swelling  Neurological: She is alert and oriented to person, place, and time.  Skin: Skin is warm and dry.  No cyanosis  Nursing note and vitals reviewed.   ED Course  Procedures (including critical care time)  None today  MDM   1. Acute sinusitis with symptoms > 10 days    Meds ordered this encounter  Medications  . levofloxacin (LEVAQUIN) 500 MG tablet    Sig: Take 1 tablet (500 mg total) by mouth daily.    Dispense:  10 tablet    Refill:  0   Recheck or followup pcp/Bess White/[facility] for further evaluation if sx's persist.  LM    Sherlene Shams, MD 10/19/15 2013

## 2015-11-08 ENCOUNTER — Encounter
Admission: RE | Admit: 2015-11-08 | Discharge: 2015-11-08 | Disposition: A | Discharge status: Home / Self Care | Payer: BLUE CROSS/BLUE SHIELD | Source: Ambulatory Visit | Attending: Surgery | Admitting: Surgery

## 2015-11-08 DIAGNOSIS — Z01812 Encounter for preprocedural laboratory examination: Secondary | ICD-10-CM | POA: Insufficient documentation

## 2015-11-08 HISTORY — DX: Sleep apnea, unspecified: G47.30

## 2015-11-08 HISTORY — DX: Headache, unspecified: R51.9

## 2015-11-08 HISTORY — DX: Anxiety disorder, unspecified: F41.9

## 2015-11-08 HISTORY — DX: Restless legs syndrome: G25.81

## 2015-11-08 HISTORY — DX: Headache: R51

## 2015-11-08 HISTORY — DX: Unspecified osteoarthritis, unspecified site: M19.90

## 2015-11-08 HISTORY — DX: Depression, unspecified: F32.A

## 2015-11-08 HISTORY — DX: Major depressive disorder, single episode, unspecified: F32.9

## 2015-11-08 LAB — DIFFERENTIAL
Basophils Absolute: 0 10*3/uL (ref 0–0.1)
Basophils Relative: 1 %
Eosinophils Absolute: 0.3 10*3/uL (ref 0–0.7)
Eosinophils Relative: 4 %
Lymphocytes Relative: 34 %
Lymphs Abs: 2.6 10*3/uL (ref 1.0–3.6)
Monocytes Absolute: 0.7 10*3/uL (ref 0.2–0.9)
Monocytes Relative: 10 %
Neutro Abs: 3.9 10*3/uL (ref 1.4–6.5)
Neutrophils Relative %: 51 %

## 2015-11-08 LAB — CBC
HCT: 38.9 % (ref 35.0–47.0)
Hemoglobin: 13.4 g/dL (ref 12.0–16.0)
MCH: 29.9 pg (ref 26.0–34.0)
MCHC: 34.4 g/dL (ref 32.0–36.0)
MCV: 86.8 fL (ref 80.0–100.0)
Platelets: 212 10*3/uL (ref 150–440)
RBC: 4.48 MIL/uL (ref 3.80–5.20)
RDW: 13.1 % (ref 11.5–14.5)
WBC: 7.6 10*3/uL (ref 3.6–11.0)

## 2015-11-08 NOTE — Patient Instructions (Signed)
  Your procedure is scheduled on: November 22, 2015 (Thursday) Report to Day Surgery.[facility] SECOND FLOOR To find out your arrival time please call [phone] between 1PM - 3PM on November 21, 2015 (Wednesday).  Remember: Instructions that are not followed completely may result in serious medical risk, up to and including death, or upon the discretion of your surgeon and anesthesiologist your surgery may need to be rescheduled.    __x__ 1. Do not eat food or drink liquids after midnight. No gum chewing or hard candies.     __x__ 2. No Alcohol for 24 hours before or after surgery.   __x__ 3. Do Not Smoke For 24 Hours Prior to Your Surgery.   ____ 4. Bring all medications with you on the day of surgery if instructed.    __x__ 5. Notify your doctor if there is any change in your medical condition     (cold, fever, infections).       Do not wear jewelry, make-up, hairpins, clips or nail polish.  Do not wear lotions, powders, or perfumes. You may wear deodorant.  Do not shave 48 hours prior to surgery. Men may shave face and neck.  Do not bring valuables to the hospital.    [facility] is not responsible for any belongings or valuables.               Contacts, dentures or bridgework may not be worn into surgery.  Leave your suitcase in the car. After surgery it may be brought to your room.  For patients admitted to the hospital, discharge time is determined by your                treatment team.   Patients discharged the day of surgery will not be allowed to drive home.   Please read over the following fact sheets that you were given:   Surgical Site Infection Prevention   _x___ Take these medicines the morning of surgery with A SIP OF WATER:    1. Prilosec  2.   3.   4.  5.  6.  ____ Fleet Enema (as directed)   __x__ Use CHG Soap as directed  ____ Use inhalers on the day of surgery  ____ Stop metformin 2 days prior to surgery    ____ Take 1/2 of usual insulin dose the  night before surgery and none on the morning of surgery.   __x__ Stop Coumadin/Plavix/aspirin on (N/A)  __x__ Stop Anti-inflammatories on (STOP NABUMETONE AND IBUPROFEN ONE WEEK PRIOR TO SURGERY) OK TO TAKE TYLENOL IF NEEDED FOR PAIN OR HEADACHE   ____ Stop supplements until after surgery.    ____ Bring C-Pap to the hospital.

## 2015-11-08 NOTE — Pre-Procedure Instructions (Signed)
  Component Name Value Range  Vent Rate (bpm) 70   PR Interval (msec) 116   QRS Interval (msec) 88   QT Interval (msec) 382   QTc (msec) 412    Result Narrative  Normal sinus rhythm Normal ECG No previous ECGs available I reviewed and concur with this report. Electronically signed [id number] MD, BENJAMIN ([id number]) on 09/05/2015 8:25:36 PM

## 2015-11-22 ENCOUNTER — Ambulatory Visit: Payer: BLUE CROSS/BLUE SHIELD | Admitting: Anesthesiology

## 2015-11-22 ENCOUNTER — Encounter: Payer: Self-pay | Admitting: *Deleted

## 2015-11-22 ENCOUNTER — Encounter
Admission: RE | Disposition: A | Discharge status: Home / Self Care | Payer: Self-pay | Source: Ambulatory Visit | Attending: Surgery

## 2015-11-22 ENCOUNTER — Ambulatory Visit
Admission: RE | Admit: 2015-11-22 | Discharge: 2015-11-22 | Disposition: A | Discharge status: Home / Self Care | Payer: BLUE CROSS/BLUE SHIELD | Source: Ambulatory Visit | Attending: Surgery | Admitting: Surgery

## 2015-11-22 DIAGNOSIS — Z8041 Family history of malignant neoplasm of ovary: Secondary | ICD-10-CM | POA: Insufficient documentation

## 2015-11-22 DIAGNOSIS — Z833 Family history of diabetes mellitus: Secondary | ICD-10-CM | POA: Insufficient documentation

## 2015-11-22 DIAGNOSIS — Z883 Allergy status to other anti-infective agents status: Secondary | ICD-10-CM | POA: Insufficient documentation

## 2015-11-22 DIAGNOSIS — Z880 Allergy status to penicillin: Secondary | ICD-10-CM | POA: Insufficient documentation

## 2015-11-22 DIAGNOSIS — Z885 Allergy status to narcotic agent status: Secondary | ICD-10-CM | POA: Insufficient documentation

## 2015-11-22 DIAGNOSIS — Z83511 Family history of glaucoma: Secondary | ICD-10-CM | POA: Diagnosis not present

## 2015-11-22 DIAGNOSIS — Z8249 Family history of ischemic heart disease and other diseases of the circulatory system: Secondary | ICD-10-CM | POA: Insufficient documentation

## 2015-11-22 DIAGNOSIS — M75111 Incomplete rotator cuff tear or rupture of right shoulder, not specified as traumatic: Secondary | ICD-10-CM | POA: Insufficient documentation

## 2015-11-22 DIAGNOSIS — X58XXXA Exposure to other specified factors, initial encounter: Secondary | ICD-10-CM | POA: Insufficient documentation

## 2015-11-22 DIAGNOSIS — Z79891 Long term (current) use of opiate analgesic: Secondary | ICD-10-CM | POA: Insufficient documentation

## 2015-11-22 DIAGNOSIS — S43431A Superior glenoid labrum lesion of right shoulder, initial encounter: Secondary | ICD-10-CM | POA: Insufficient documentation

## 2015-11-22 DIAGNOSIS — Z836 Family history of other diseases of the respiratory system: Secondary | ICD-10-CM | POA: Diagnosis not present

## 2015-11-22 DIAGNOSIS — F419 Anxiety disorder, unspecified: Secondary | ICD-10-CM | POA: Diagnosis not present

## 2015-11-22 DIAGNOSIS — Z79899 Other long term (current) drug therapy: Secondary | ICD-10-CM | POA: Diagnosis not present

## 2015-11-22 DIAGNOSIS — Z8262 Family history of osteoporosis: Secondary | ICD-10-CM | POA: Diagnosis not present

## 2015-11-22 DIAGNOSIS — Z91048 Other nonmedicinal substance allergy status: Secondary | ICD-10-CM | POA: Insufficient documentation

## 2015-11-22 DIAGNOSIS — Z91040 Latex allergy status: Secondary | ICD-10-CM | POA: Diagnosis not present

## 2015-11-22 DIAGNOSIS — Z791 Long term (current) use of non-steroidal anti-inflammatories (NSAID): Secondary | ICD-10-CM | POA: Diagnosis not present

## 2015-11-22 DIAGNOSIS — Z800 Family history of malignant neoplasm of digestive organs: Secondary | ICD-10-CM | POA: Diagnosis not present

## 2015-11-22 DIAGNOSIS — Z90710 Acquired absence of both cervix and uterus: Secondary | ICD-10-CM | POA: Diagnosis not present

## 2015-11-22 DIAGNOSIS — F329 Major depressive disorder, single episode, unspecified: Secondary | ICD-10-CM | POA: Insufficient documentation

## 2015-11-22 DIAGNOSIS — Z888 Allergy status to other drugs, medicaments and biological substances status: Secondary | ICD-10-CM | POA: Diagnosis not present

## 2015-11-22 DIAGNOSIS — Z9889 Other specified postprocedural states: Secondary | ICD-10-CM | POA: Insufficient documentation

## 2015-11-22 DIAGNOSIS — Z889 Allergy status to unspecified drugs, medicaments and biological substances status: Secondary | ICD-10-CM | POA: Diagnosis not present

## 2015-11-22 DIAGNOSIS — Z7951 Long term (current) use of inhaled steroids: Secondary | ICD-10-CM | POA: Diagnosis not present

## 2015-11-22 DIAGNOSIS — K219 Gastro-esophageal reflux disease without esophagitis: Secondary | ICD-10-CM | POA: Diagnosis not present

## 2015-11-22 HISTORY — PX: SHOULDER ARTHROSCOPY WITH OPEN ROTATOR CUFF REPAIR: SHX6092

## 2015-11-22 SURGERY — ARTHROSCOPY, SHOULDER WITH REPAIR, ROTATOR CUFF, OPEN
Anesthesia: General | Site: Shoulder | Laterality: Right | Wound class: Clean

## 2015-11-22 MED ORDER — CLINDAMYCIN PHOSPHATE 900 MG/50ML IV SOLN
900.0000 mg | Freq: Once | INTRAVENOUS | Status: AC
  Administered 2015-11-22: 900 mg via INTRAVENOUS

## 2015-11-22 MED ORDER — LIDOCAINE HCL (PF) 2 % IJ SOLN
INTRAMUSCULAR | Status: DC | PRN
  Administered 2015-11-22: 3 mL via INTRADERMAL

## 2015-11-22 MED ORDER — FENTANYL CITRATE (PF) 100 MCG/2ML IJ SOLN
INTRAMUSCULAR | Status: DC | PRN
  Administered 2015-11-22: 100 ug via INTRAVENOUS
  Administered 2015-11-22: 50 ug via INTRAVENOUS

## 2015-11-22 MED ORDER — LACTATED RINGERS IV SOLN
INTRAVENOUS | Status: DC
  Administered 2015-11-22: 50 mL/h via INTRAVENOUS

## 2015-11-22 MED ORDER — BUPIVACAINE-EPINEPHRINE 0.5% -1:200000 IJ SOLN
INTRAMUSCULAR | Status: DC | PRN
  Administered 2015-11-22: 30 mL

## 2015-11-22 MED ORDER — FENTANYL CITRATE (PF) 100 MCG/2ML IJ SOLN
50.0000 ug | Freq: Once | INTRAMUSCULAR | Status: AC
  Administered 2015-11-22: 50 ug via INTRAVENOUS

## 2015-11-22 MED ORDER — MIDAZOLAM HCL 2 MG/2ML IJ SOLN
2.0000 mg | Freq: Once | INTRAMUSCULAR | Status: AC
  Administered 2015-11-22: 2 mg via INTRAVENOUS

## 2015-11-22 MED ORDER — FENTANYL CITRATE (PF) 100 MCG/2ML IJ SOLN
INTRAMUSCULAR | Status: AC
  Administered 2015-11-22: 50 ug via INTRAVENOUS
  Filled 2015-11-22: qty 2

## 2015-11-22 MED ORDER — MORPHINE SULFATE 15 MG PO TABS: 15.0000 mg | ORAL_TABLET | ORAL | Status: DC | PRN

## 2015-11-22 MED ORDER — ROCURONIUM BROMIDE 100 MG/10ML IV SOLN
INTRAVENOUS | Status: DC | PRN
  Administered 2015-11-22: 40 mg via INTRAVENOUS
  Administered 2015-11-22: 20 mg via INTRAVENOUS
  Administered 2015-11-22: 10 mg via INTRAVENOUS

## 2015-11-22 MED ORDER — ONDANSETRON HCL 4 MG/2ML IJ SOLN
INTRAMUSCULAR | Status: DC | PRN
  Administered 2015-11-22: 4 mg via INTRAVENOUS

## 2015-11-22 MED ORDER — ONDANSETRON HCL 4 MG/2ML IJ SOLN: 4.0000 mg | Freq: Once | INTRAMUSCULAR | Status: DC | PRN

## 2015-11-22 MED ORDER — PROPOFOL 10 MG/ML IV BOLUS
INTRAVENOUS | Status: DC | PRN
  Administered 2015-11-22: 150 mg via INTRAVENOUS

## 2015-11-22 MED ORDER — ROPIVACAINE HCL 5 MG/ML IJ SOLN
INTRAMUSCULAR | Status: DC | PRN
  Administered 2015-11-22 (×3): 10 mL via EPIDURAL

## 2015-11-22 MED ORDER — EPINEPHRINE HCL 1 MG/ML IJ SOLN
INTRAMUSCULAR | Status: DC | PRN
  Administered 2015-11-22: 2 mL

## 2015-11-22 MED ORDER — EPINEPHRINE HCL 1 MG/ML IJ SOLN
INTRAMUSCULAR | Status: AC
  Filled 2015-11-22: qty 1

## 2015-11-22 MED ORDER — SUCCINYLCHOLINE CHLORIDE 20 MG/ML IJ SOLN
INTRAMUSCULAR | Status: DC | PRN
  Administered 2015-11-22: 100 mg via INTRAVENOUS

## 2015-11-22 MED ORDER — MIDAZOLAM HCL 5 MG/5ML IJ SOLN
INTRAMUSCULAR | Status: AC
  Administered 2015-11-22: 2 mg via INTRAVENOUS
  Filled 2015-11-22: qty 5

## 2015-11-22 MED ORDER — EPINEPHRINE HCL 1 MG/ML IJ SOLN
INTRAMUSCULAR | Status: AC
  Filled 2015-11-22: qty 2

## 2015-11-22 MED ORDER — BUPIVACAINE-EPINEPHRINE (PF) 0.5% -1:200000 IJ SOLN
INTRAMUSCULAR | Status: AC
  Filled 2015-11-22: qty 30

## 2015-11-22 MED ORDER — SUGAMMADEX SODIUM 200 MG/2ML IV SOLN
INTRAVENOUS | Status: DC | PRN
  Administered 2015-11-22: 220 mg via INTRAVENOUS

## 2015-11-22 MED ORDER — FENTANYL CITRATE (PF) 100 MCG/2ML IJ SOLN: 25.0000 ug | INTRAMUSCULAR | Status: DC | PRN

## 2015-11-22 SURGICAL SUPPLY — 51 items
ANCHOR JUGGERKNOT WTAP NDL 2.9 (Anchor) ×2 IMPLANT
ANCHOR SUT W/ ORTHOCORD (Anchor) ×2 IMPLANT
BIT DRILL JUGRKNT W/NDL BIT2.9 (DRILL) ×1 IMPLANT
BLADE FULL RADIUS 3.5 (BLADE) ×2 IMPLANT
BLADE SHAVER 4.5X7 STR FR (MISCELLANEOUS) IMPLANT
BUR ACROMIONIZER 4.0 (BURR) ×2 IMPLANT
BUR BR 5.5 WIDE MOUTH (BURR) IMPLANT
CANNULA SHAVER 8MMX76MM (CANNULA) ×2 IMPLANT
CHLORAPREP W/TINT 26ML (MISCELLANEOUS) ×4 IMPLANT
COVER MAYO STAND STRL (DRAPES) ×2 IMPLANT
DRAPE IMP U-DRAPE 54X76 (DRAPES) ×4 IMPLANT
DRILL JUGGERKNOT W/NDL BIT 2.9 (DRILL) ×2
DRSG OPSITE POSTOP 3X4 (GAUZE/BANDAGES/DRESSINGS) ×6 IMPLANT
DRSG OPSITE POSTOP 4X6 (GAUZE/BANDAGES/DRESSINGS) ×2 IMPLANT
DRSG OPSITE POSTOP 4X8 (GAUZE/BANDAGES/DRESSINGS) ×2 IMPLANT
ELECT REM PT RETURN 9FT ADLT (ELECTROSURGICAL) ×2
ELECTRODE REM PT RTRN 9FT ADLT (ELECTROSURGICAL) ×1 IMPLANT
GAUZE PETRO XEROFOAM 1X8 (MISCELLANEOUS) ×2 IMPLANT
GAUZE SPONGE 4X4 12PLY STRL (GAUZE/BANDAGES/DRESSINGS) ×2 IMPLANT
GAUZE XEROFORM 4X4 STRL (GAUZE/BANDAGES/DRESSINGS) ×2 IMPLANT
GLOVE BIO SURGEON STRL SZ7.5 (GLOVE) ×4 IMPLANT
GLOVE BIO SURGEON STRL SZ8 (GLOVE) ×4 IMPLANT
GLOVE BIOGEL PI IND STRL 8 (GLOVE) ×1 IMPLANT
GLOVE BIOGEL PI INDICATOR 8 (GLOVE) ×1
GLOVE INDICATOR 8.0 STRL GRN (GLOVE) ×2 IMPLANT
GOWN STRL REUS W/ TWL LRG LVL3 (GOWN DISPOSABLE) ×2 IMPLANT
GOWN STRL REUS W/ TWL XL LVL3 (GOWN DISPOSABLE) ×1 IMPLANT
GOWN STRL REUS W/TWL LRG LVL3 (GOWN DISPOSABLE) ×2
GOWN STRL REUS W/TWL XL LVL3 (GOWN DISPOSABLE) ×1
GRASPER SUT 15 45D LOW PRO (SUTURE) ×2 IMPLANT
IV LACTATED RINGER IRRG 3000ML (IV SOLUTION) ×2
IV LR IRRIG 3000ML ARTHROMATIC (IV SOLUTION) ×2 IMPLANT
MANIFOLD NEPTUNE II (INSTRUMENTS) ×2 IMPLANT
MASK FACE SPIDER DISP (MASK) ×2 IMPLANT
MAT BLUE FLOOR 46X72 FLO (MISCELLANEOUS) ×2 IMPLANT
NEEDLE FILTER BLUNT 18X 1/2SAF (NEEDLE) ×1
NEEDLE FILTER BLUNT 18X1 1/2 (NEEDLE) ×1 IMPLANT
NEEDLE REVERSE CUT 1/2 CRC (NEEDLE) IMPLANT
PACK ARTHROSCOPY SHOULDER (MISCELLANEOUS) ×2 IMPLANT
SLING ARM LRG DEEP (SOFTGOODS) ×2 IMPLANT
SLING ULTRA II LG (MISCELLANEOUS) ×2 IMPLANT
STAPLER SKIN PROX 35W (STAPLE) ×2 IMPLANT
STRAP SAFETY BODY (MISCELLANEOUS) ×2 IMPLANT
SUT ETHIBOND 0 MO6 C/R (SUTURE) ×2 IMPLANT
SUT VIC AB 2-0 CT1 27 (SUTURE) ×2
SUT VIC AB 2-0 CT1 TAPERPNT 27 (SUTURE) ×2 IMPLANT
SYR 3ML LL SCALE MARK (SYRINGE) ×2 IMPLANT
TAPE MICROFOAM 4IN (TAPE) ×2 IMPLANT
TUBING ARTHRO INFLOW-ONLY STRL (TUBING) ×2 IMPLANT
TUBING CONNECTING 10 (TUBING) ×2 IMPLANT
WAND HAND CNTRL MULTIVAC 90 (MISCELLANEOUS) ×2 IMPLANT

## 2015-11-22 NOTE — Anesthesia Procedure Notes (Addendum)
Procedure Name: Intubation Date/Time: 11/22/2015 9:13 AM Performed by: Jonna Clark Pre-anesthesia Checklist: Patient identified, Patient being monitored, Timeout performed, Emergency Drugs available and Suction available Patient Re-evaluated:Patient Re-evaluated prior to inductionOxygen Delivery Method: Circle system utilized Preoxygenation: Pre-oxygenation with 100% oxygen Intubation Type: IV induction Ventilation: Mask ventilation without difficulty Laryngoscope Size: Mac and 3 Grade View: Grade I Tube type: Oral Tube size: 7.0 mm Number of attempts: 1 Placement Confirmation: ETT inserted through vocal cords under direct vision,  positive ETCO2 and breath sounds checked- equal and bilateral Secured at: 21 cm Tube secured with: Tape Dental Injury: Teeth and Oropharynx as per pre-operative assessment     Anesthesia Regional Block:  Interscalene brachial plexus block  Pre-Anesthetic Checklist: ,, timeout performed, Correct Patient, Correct Site, Correct Laterality, Correct Procedure, Correct Position, site marked, Risks and benefits discussed,  Surgical consent,  Pre-op evaluation,  At surgeon's request and post-op pain management  Laterality: Right  Prep: Maximum Sterile Barrier Precautions used and chloraprep       Needles:   Needle Type: Stimiplex     Needle Length: 5cm 5 cm Needle Gauge: 22 and 22 G    Additional Needles:  Procedures: ultrasound guided (picture in chart) and nerve stimulator Interscalene brachial plexus block  Nerve Stimulator or Paresthesia:  Response: 0.45 mA,   Additional Responses:   Narrative:  Start time: 11/22/2015 8:40 AM End time: 11/22/2015 8:40 AM Injection made incrementally with aspirations every 5 mL.  Performed by: Personally  Anesthesiologist: Alvin Critchley  Additional Notes: Time out performed.  Roll place under R back.  The right side of the neck was prepped and draped in sterile fashion.  Sterile [place] gel was used to  visualize the plexus in the typical stoplight pattern in the c6 level.  A skin wheal was made lateral to the probe with 1% Lidocaine plain.  A 22 G stimuplex needle was guided to the bundle along with nerve stimulator support.  A deltoid twitch was obtained and faded around 0.45 mAmps.  30  Cc of plain 0.5% ropivacaine was incrementally injected with easy injection and no pain on injection.  Patient tolerated the procedure well and a picture was included in the chart

## 2015-11-22 NOTE — Transfer of Care (Signed)
Immediate Anesthesia Transfer of Care Note  Patient: Sharon [name]  Procedure(s) Performed: Procedure(s): SHOULDER ARTHROSCOPY, DEBRIDEMENT, DECOMPRESSION, SLAP REPAIR, POSSIBLE BICEP TENODESIS (Right)  Patient Location: PACU  Anesthesia Type:General  Level of Consciousness: sedated and responds to stimulation  Airway & Oxygen Therapy: Patient Spontanous Breathing and Patient connected to face mask oxygen  Post-op Assessment: Report given to RN and Post -op Vital signs reviewed and stable  Post vital signs: Reviewed and stable  Last Vitals:  Filed Vitals:   11/22/15 0853 11/22/15 1055  BP: 135/74 130/75  Pulse: 79 97  Temp:  36.3 C  Resp: 18 14    Last Pain:  Filed Vitals:   11/22/15 1058  PainSc: 4          Complications: No apparent anesthesia complications

## 2015-11-22 NOTE — Anesthesia Preprocedure Evaluation (Addendum)
Anesthesia Evaluation  Patient identified by MRN, date of birth, ID band Patient awake    Reviewed: Allergy & Precautions, NPO status , Patient's Chart, lab work & pertinent test results  Airway Mallampati: III  TM Distance: <3 FB     Dental  (+) Chipped   Pulmonary sleep apnea ,    Pulmonary exam normal        Cardiovascular + Peripheral Vascular Disease  Normal cardiovascular exam     Neuro/Psych  Headaches, Anxiety Depression    GI/Hepatic Neg liver ROS, GERD  Medicated and Controlled,  Endo/Other  negative endocrine ROS  Renal/GU stones     Musculoskeletal  (+) Arthritis , Osteoarthritis,    Abdominal Normal abdominal exam  (+)   Peds negative pediatric ROS (+)  Hematology negative hematology ROS (+)   Anesthesia Other Findings   Reproductive/Obstetrics                            Anesthesia Physical Anesthesia Plan  ASA: III  Anesthesia Plan: General   Post-op Pain Management:    Induction: Intravenous  Airway Management Planned: Oral ETT  Additional Equipment:   Intra-op Plan:   Post-operative Plan: Extubation in OR  Informed Consent: I have reviewed the patients History and Physical, chart, labs and discussed the procedure including the risks, benefits and alternatives for the proposed anesthesia with the patient or authorized representative who has indicated his/her understanding and acceptance.   Dental advisory given  Plan Discussed with: CRNA and Surgeon  Anesthesia Plan Comments:         Anesthesia Quick Evaluation

## 2015-11-22 NOTE — Op Note (Addendum)
11/22/2015  10:38 AM  Patient:   Sharon [name]  Pre-Op Diagnosis:   Impingement/tendinopathy with SLAP tear and para-labral cyst, right shoulder.  Postoperative diagnosis: Impingement/tendinopathy with partial-thickness rotator cuff tear, SLAP tear, and para-labral cyst, right shoulder.  Procedure: Limited arthroscopic debridement, SLAP repair, arthroscopic subacromial decompression, mini-open repair of partial-thickness rotator cuff tear, and mini-open biceps tenodesis, right shoulder.  Anesthesia: General endotracheal with interscalene block placed preoperatively by the anesthesiologist.  Surgeon:   Pascal Lux, MD  Assistant:   Cameron Proud, PA-C  Findings: As above. There was a bursal surface partial thickness tear of the mid insertional fibers of the supraspinatus tendon. Otherwise, the rotator cuff was in satisfactory condition. There was a tear of the labrum extending from the 12:00 to the 2:00 position. The remainder of the glenoid labrum was in satisfactory condition, as was the biceps tendon. There were grade 1 chondromalacial changes involving the central portion of the glenoid. The humeral articular was in excellent condition.  Complications: None  Fluids:   600 cc  Estimated blood loss: 5 cc  Tourniquet time: None  Drains: None  Closure: Staples   Brief clinical note: The patient is a 55 year old female with a history of right shoulder pain following an injury. The patient's symptoms have progressed despite medications, activity modification, etc. The patient's history and examination are consistent with impingement/tendinopathy with a possible rotator cuff tear. An MRI scan demonstrated a SLAP tear with a para-labral cyst and possible small rotator cuff tear. The patient presents at this time for definitive management of her shoulder symptoms.  Procedure: The patient underwent placement of an interscalene block by the anesthesiologist  in the preoperative holding area before she was brought into the operating room and lain in the supine position. The patient then underwent general endotracheal intubation and anesthesia before being repositioned in the beach chair position using the beach chair positioner. The right shoulder and upper extremity were prepped with ChloraPrep solution before being draped sterilely. Preoperative antibiotics were administered. A timeout was performed to confirm the proper surgical site before the expected portal sites and incision site were injected with 0.5% Sensorcaine with epinephrine. A posterior portal was created and the glenohumeral joint thoroughly inspected with the findings as described above. An anterior portal was created using an outside-in technique. The labrum and rotator cuff were further probed, again confirming the above-noted findings. The area of grade 1 chondromalacial fraying of the central glenoid, as well as mild fraying of the labrum and some reactive synovitis all were debrided using the full-radius resector. The biceps tendon was released from its labral attachment using the ArthroCare wand. The exposed glenoid rim was roughened with an end-cutting rasp and full-radius resector down to bleeding bone before the labrum was repaired using a single Mitek BioKnotless anchor placed at approximately the 1:00 position through a superolateral portal site which been placed using an outside-in technique. Prior to stabilization of the torn portion of the labrum, the cyst was debrided using the full-radius resector which was passed through the labral defect to access the cyst. Subsequent probing of the remaining rim demonstrated excellent stability. The instruments were removed from the joint after suctioning the excess fluid.  The camera was repositioned through the posterior portal into the subacromial space. A separate lateral portal was created using an outside-in technique. The 3.5 mm full-radius  resector was introduced and used to perform a subtotal bursectomy. The ArthroCare wand was then inserted and used to remove the periosteal tissue off  the undersurface of the anterior third of the acromion as well as to recess the coracoacromial ligament from its attachment along the anterior and lateral margins of the acromion. The 4.0 mm acromionizing bur was introduced and used to complete the decompression by removing the undersurface of the anterior third of the acromion. The full radius resector was reintroduced to remove any residual bony debris before the ArthroCare wand was reintroduced to obtain hemostasis. The instruments were then removed from the subacromial space after suctioning the excess fluid.  An approximately 4-5 cm incision was made over the anterolateral aspect of the shoulder beginning at the anterolateral corner of the acromion and extending distally in line with the bicipital groove. This incision was carried down through the subcutaneous tissues to expose the deltoid fascia. The raphae between the anterior and middle thirds was identified and this plane developed to provide access into the subacromial space. Additional bursal tissues were debrided sharply using Metzenbaum scissors. The rotator cuff was carefully inspected and the small partial-thickness bursal surface tear involving the mid insertional fibers of the supraspinatus was identified. This was repaired using a single #0 Ethibond suture placed in a side-to-side fashion. An apparent watertight closure was obtained.  The bicipital groove was identified by palpation and opened for 1-1.5 cm. The biceps tendon stump was retrieved through this defect. The floor of the bicipital groove was roughened with a curet before another Biomet 2.9 mm JuggerKnot anchor was inserted. Both sets of sutures were passed through the biceps tendon and tied securely to effect the tenodesis. The bicipital sheath was reapproximated using two #0 Ethibond  interrupted sutures, incorporating the biceps tendon to further reinforce the tenodesis.  The wound was copiously irrigated with sterile saline solution before the deltoid raphae was reapproximated using 2-0 Vicryl interrupted sutures. The subcutaneous tissues were closed in two layers using 2-0 Vicryl interrupted sutures before the skin was closed using staples. The portal sites also were closed using staples. A sterile bulky dressing was applied to the shoulder before the arm was placed into a shoulder immobilizer. The patient was then awakened, extubated, and returned to the recovery room in satisfactory condition after tolerating the procedure well.

## 2015-11-22 NOTE — Discharge Instructions (Addendum)
May shower with intact Op-site dressings.  Change Op-site dressings as necessary. Apply ice frequently to shoulder. Keep shoulder immobilizer on at all times except may remove for bathing purposes. Follow-up in 10-14 days or as scheduled.  AMBULATORY SURGERY  DISCHARGE INSTRUCTIONS   1) The drugs that you were given will stay in your system until tomorrow so for the next 24 hours you should not:  A) Drive an automobile B) Make any legal decisions C) Drink any alcoholic beverage   2) You may resume regular meals tomorrow.  Today it is better to start with liquids and gradually work up to solid foods.  You may eat anything you prefer, but it is better to start with liquids, then soup and crackers, and gradually work up to solid foods.   3) Please notify your doctor immediately if you have any unusual bleeding, trouble breathing, redness and pain at the surgery site, drainage, fever, or pain not relieved by medication.    4) Additional Instructions:        Please contact your physician with any problems or Same Day Surgery at [phone], Monday through Friday 6 am to 4 pm, or [place] at [facility] number at [phone].

## 2015-11-22 NOTE — H&P (Signed)
Paper H&P to be scanned into permanent record. H&P reviewed. No changes. 

## 2015-11-24 NOTE — Anesthesia Postprocedure Evaluation (Signed)
Anesthesia Post Note  Patient: Sharon [name]  Procedure(s) Performed: Procedure(s) (LRB): SHOULDER ARTHROSCOPY, DEBRIDEMENT, DECOMPRESSION, SLAP REPAIR, POSSIBLE BICEP TENODESIS (Right)  Patient location during evaluation: PACU Anesthesia Type: General Level of consciousness: awake and alert and oriented Pain management: pain level controlled Vital Signs Assessment: post-procedure vital signs reviewed and stable Respiratory status: spontaneous breathing Cardiovascular status: blood pressure returned to baseline Anesthetic complications: no    Last Vitals:  Filed Vitals:   11/22/15 1159 11/22/15 1201  BP:  127/71  Pulse: 93   Temp: 37.3 C   Resp: 16     Last Pain:  Filed Vitals:   11/23/15 0846  PainSc: 10-Worst pain ever                 Deana Krock

## 2015-12-03 ENCOUNTER — Encounter: Payer: Self-pay | Admitting: *Deleted

## 2015-12-03 ENCOUNTER — Other Ambulatory Visit: Payer: Self-pay | Admitting: Surgery

## 2015-12-03 ENCOUNTER — Emergency Department
Admission: EM | Admit: 2015-12-03 | Discharge: 2015-12-03 | Disposition: A | Discharge status: Home / Self Care | Payer: BLUE CROSS/BLUE SHIELD | Attending: Emergency Medicine | Admitting: Emergency Medicine

## 2015-12-03 ENCOUNTER — Ambulatory Visit
Admission: RE | Admit: 2015-12-03 | Discharge: 2015-12-03 | Disposition: A | Discharge status: Home / Self Care | Payer: BLUE CROSS/BLUE SHIELD | Source: Ambulatory Visit | Attending: Surgery | Admitting: Surgery

## 2015-12-03 DIAGNOSIS — M7581 Other shoulder lesions, right shoulder: Secondary | ICD-10-CM

## 2015-12-03 DIAGNOSIS — Z79891 Long term (current) use of opiate analgesic: Secondary | ICD-10-CM | POA: Diagnosis not present

## 2015-12-03 DIAGNOSIS — M75111 Incomplete rotator cuff tear or rupture of right shoulder, not specified as traumatic: Secondary | ICD-10-CM

## 2015-12-03 DIAGNOSIS — I82431 Acute embolism and thrombosis of right popliteal vein: Secondary | ICD-10-CM

## 2015-12-03 DIAGNOSIS — S43431D Superior glenoid labrum lesion of right shoulder, subsequent encounter: Secondary | ICD-10-CM

## 2015-12-03 DIAGNOSIS — Z79899 Other long term (current) drug therapy: Secondary | ICD-10-CM | POA: Insufficient documentation

## 2015-12-03 DIAGNOSIS — I824Z1 Acute embolism and thrombosis of unspecified deep veins of right distal lower extremity: Secondary | ICD-10-CM

## 2015-12-03 DIAGNOSIS — F329 Major depressive disorder, single episode, unspecified: Secondary | ICD-10-CM | POA: Diagnosis not present

## 2015-12-03 DIAGNOSIS — I82401 Acute embolism and thrombosis of unspecified deep veins of right lower extremity: Secondary | ICD-10-CM

## 2015-12-03 DIAGNOSIS — R609 Edema, unspecified: Secondary | ICD-10-CM

## 2015-12-03 DIAGNOSIS — M1990 Unspecified osteoarthritis, unspecified site: Secondary | ICD-10-CM | POA: Insufficient documentation

## 2015-12-03 DIAGNOSIS — M79604 Pain in right leg: Secondary | ICD-10-CM

## 2015-12-03 DIAGNOSIS — Z791 Long term (current) use of non-steroidal anti-inflammatories (NSAID): Secondary | ICD-10-CM | POA: Diagnosis not present

## 2015-12-03 MED ORDER — IBUPROFEN 800 MG PO TABS
800.0000 mg | ORAL_TABLET | Freq: Once | ORAL | Status: AC
  Administered 2015-12-03: 800 mg via ORAL

## 2015-12-03 MED ORDER — APIXABAN 5 MG PO TABS
10.0000 mg | ORAL_TABLET | Freq: Two times a day (BID) | ORAL | Status: DC
  Filled 2015-12-03: qty 2

## 2015-12-03 MED ORDER — APIXABAN 5 MG PO TABS: ORAL_TABLET | ORAL | Status: DC

## 2015-12-03 MED ORDER — APIXABAN 5 MG PO TABS
10.0000 mg | ORAL_TABLET | Freq: Once | ORAL | Status: AC
  Administered 2015-12-03: 10 mg via ORAL
  Filled 2015-12-03: qty 2

## 2015-12-03 MED ORDER — IBUPROFEN 800 MG PO TABS
ORAL_TABLET | ORAL | Status: AC
  Administered 2015-12-03: 800 mg via ORAL
  Filled 2015-12-03: qty 1

## 2015-12-03 NOTE — ED Provider Notes (Signed)
[facility] Emergency Department Provider Note   ____________________________________________  Time seen: Approximately 5:15pm I have reviewed the triage vital signs and the triage nursing note.  HISTORY  Chief Complaint DVT   Historian Patient  HPI Sharon [name] is a 55 y.o. 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  She had Right shoulder surgery with Dr. Roland Rack, ortho 2 weeks ago and developedright calf pain, redness and swelling and was ordered to do an ultrasound today -- called with result showing positive RLE DVT.  No shortness of breath or pleuritic chest pain, palpitations or dizziness. Right lower extremity pain is moderate. She does have morphine at home which she can take for her postsurgical pain.      Past Medical History  Diagnosis Date  . Kidney calculi   . Leg DVT (deep venous thromboembolism), acute ([place])   . Acid reflux   . Left ureteral stone   . Bladder spasm   . Urge incontinence   . Sleep apnea     does not have C-PAP @ present  . Depression   . Anxiety   . Headache   . Arthritis   . Restless leg syndrome     Patient Active Problem List   Diagnosis Date Noted  . Kidney stones 09/10/2015  . Urge incontinence 09/10/2015  . Nocturia 09/10/2015    Past Surgical History  Procedure Laterality Date  . Kidney stone surgery    . Abdominal hysterectomy    . Cholecystectomy    . Knee arthroscopy Right     X 2  . Sinus exploration    . Shoulder arthroscopy with open rotator cuff repair Right 11/22/2015    Procedure: SHOULDER ARTHROSCOPY, DEBRIDEMENT, DECOMPRESSION, SLAP REPAIR, POSSIBLE BICEP TENODESIS;  Surgeon: Corky Mull, MD;  Location: [facility] ORS;  Service: Orthopedics;  Laterality: Right;    Current Outpatient Rx  Name  Route  Sig  Dispense  Refill  . cyclobenzaprine (FLEXERIL) 10 MG  tablet   Oral   Take 10 mg by mouth 3 (three) times daily as needed for muscle spasms.         Marland Kitchen doxepin (SINEQUAN) 10 MG capsule   Oral   Take 10 mg by mouth at bedtime.          Marland Kitchen EPINEPHrine (EPIPEN 2-PAK) 0.3 mg/0.3 mL IJ SOAJ injection   Intramuscular   Inject 0.3 mg into the muscle as needed. For anaphylaxis         . levocetirizine (XYZAL) 5 MG tablet   Oral   Take 5 mg by mouth every evening.         . linaclotide (LINZESS) 290 MCG CAPS capsule   Oral   Take 290 mcg by mouth daily before breakfast.         . montelukast (SINGULAIR) 10 MG tablet   Oral   Take 10 mg by mouth at bedtime.         Marland Kitchen morphine (MSIR) 15 MG tablet   Oral   Take 1 tablet (15 mg total) by mouth every 4 (four) hours as needed for severe pain. Patient taking differently: Take 15 mg by mouth every 3 (three) hours as needed for severe pain.    50 tablet   0   . nabumetone (RELAFEN) 500 MG tablet   Oral   Take 500  mg by mouth 2 (two) times daily.      3   . Olopatadine HCl (PATADAY) 0.2 % SOLN   Both Eyes   Place 1 drop into both eyes 2 (two) times daily as needed. For dry eyes         . omeprazole (PRILOSEC) 40 MG capsule   Oral   Take 40 mg by mouth 2 (two) times daily.      3   . apixaban (ELIQUIS) 5 MG TABS tablet      10mg  twice per day for 7 days then 5mg  twice per day   70 tablet   0   . fluticasone (FLONASE) 50 MCG/ACT nasal spray   Each Nare   Place 1 spray into both nostrils 2 (two) times daily. Patient not taking: Reported on 12/03/2015   16 g   0     Allergies Alcohol; Bacitracin-neomycin-polymyxin; Codeine; Hydrocodone; Hydrocortisone; Lanolin; Nickel; Tape; Tramadol; Bacitracin; Clarithromycin; Latex; Oxycodone; Penicillins; and Sulfamethoxazole-trimethoprim  Family History  Problem Relation Age of Onset  . Cancer Mother   . Lung cancer Father   . Hematuria Brother   . Kidney disease Paternal Grandfather   . Bladder Cancer Neg Hx   . Kidney  Stones Brother   Mother had DVT.  Social History Social History  Substance Use Topics  . Smoking status: Never Smoker   . Smokeless tobacco: Never Used  . Alcohol Use: No    Review of Systems  Constitutional: Negative for fever. Eyes: Negative for visual changes. ENT: Negative for sore throat. Cardiovascular: Negative for chest pain. Respiratory: Negative for shortness of breath. Gastrointestinal: Negative for abdominal pain, vomiting and diarrhea. Genitourinary: Negative for dysuria. Musculoskeletal: Negative for back pain. Skin: Negative for rash. Neurological: Negative for headache. 10 point Review of Systems otherwise negative ____________________________________________   PHYSICAL EXAM:  VITAL SIGNS: ED Triage Vitals  Enc Vitals Group     BP 12/03/15 1552 142/85 mmHg     Pulse Rate 12/03/15 1552 107     Resp 12/03/15 1552 16     Temp 12/03/15 1552 98.6 F (37 C)     Temp Source 12/03/15 1552 Oral     SpO2 12/03/15 1552 99 %     Weight 12/03/15 1552 253 lb (114.76 kg)     Height 12/03/15 1552 5\' 6"  (1.676 m)     Head Cir --      Peak Flow --      Pain Score 12/03/15 1552 8     Pain Loc --      Pain Edu? --      Excl. in [place]? --      Constitutional: Alert and oriented. Well appearing and in no distress. HEENT   Head: Normocephalic and atraumatic.      Eyes: Conjunctivae are normal. PERRL. Normal extraocular movements.      Ears:         Nose: No congestion/rhinnorhea.   Mouth/Throat: Mucous membranes are moist.   Neck: No stridor. Cardiovascular/Chest: Normal rate, regular rhythm.  No murmurs, rubs, or gallops. Respiratory: Normal respiratory effort without tachypnea nor retractions. Breath sounds are clear and equal bilaterally. No wheezes/rales/rhonchi. Gastrointestinal: Soft. No distention, no guarding, no rebound. Nontender.    Genitourinary/rectal:Deferred Musculoskeletal: Right shoulder with Steri-Strips in place. Shoulder and shoulder  immobilizer.  Right lower extremity calf pain and some redness without cellulitis appearance or any drainage. Neurologic:  Normal speech and language. No gross or focal neurologic deficits are appreciated. Skin:  Skin  is warm, dry and intact. No rash noted. Psychiatric: Mood and affect are normal. Speech and behavior are normal. Patient exhibits appropriate insight and judgment.  ____________________________________________   EKG I, Lisa Roca, MD, the attending physician have personally viewed and interpreted all ECGs.  None ____________________________________________  LABS (pertinent positives/negatives)  Labs Reviewed - No data to display  ____________________________________________  RADIOLOGY All Xrays were viewed by me. Imaging interpreted by Radiologist.  Ultrasound right lower extremity:IMPRESSION: There is acute deep venous thrombosis throughout the right popliteal vein and calf veins. Other venous structures appear patent and within normal limits.  These results will be called to the ordering clinician or representative by the Radiologist Assistant, and communication documented in the PACS or zVision Dashboard.   Electronically Signed  By: Lowella Grip III M.D.  On: 12/03/2015 15:11 __________________________________________  PROCEDURES  Procedure(s) performed: None  Critical Care performed: None  ____________________________________________   ED COURSE / ASSESSMENT AND PLAN  Pertinent labs & imaging results that were available during my care of the patient were reviewed by me and considered in my medical decision making (see chart for details).   This patient came in with a new report of right lower extremity DVT. I did review the report.  Of note, the patient did have a postsurgical DVT many years ago was on 3 months of what off Lovenox.  Today and to start her on Eloquis, and have her follow-up with her primary care physician. She  will likely be referred to hematology I spoke with patient about this given her prior history of DVT and now this is the second one and family history, she may need longer term management or additional workup.  No symptoms of PE.    CONSULTATIONS:   None   Patient / Family / Caregiver informed of clinical course, medical decision-making process, and agree with plan.   I discussed return precautions, follow-up instructions, and discharged instructions with patient and/or family.   ___________________________________________   FINAL CLINICAL IMPRESSION(S) / ED DIAGNOSES   Final diagnoses:  Acute deep vein thrombosis (DVT) of right lower extremity, unspecified vein (HCC)              Note: This dictation was prepared with Dragon dictation. Any transcriptional errors that result from this process are unintentional   Lisa Roca, MD 12/03/15 1732

## 2015-12-03 NOTE — ED Notes (Signed)
Pt was sent from [facility] for DVT in right lug, US done today, pt had shoulder surgrey July 6th, states pain and tightness in her right leg

## 2015-12-03 NOTE — Discharge Instructions (Signed)
You are being treated for deep vein clot with blood thinner called Eloquis. Return to the emergency department for any worsening pain, weakness, numbness, black or bloody stools, vomiting blood, confusion altered mental status, or any other symptoms concerning to you.  Follow up with your primary care doctor as well as orthopedic physician.   Deep Vein Thrombosis A deep vein thrombosis (DVT) is a blood clot (thrombus) that usually occurs in a deep, larger vein of the lower leg or the pelvis, or in an upper extremity such as the arm. These are dangerous and can lead to serious and even life-threatening complications if the clot travels to the lungs. A DVT can damage the valves in your leg veins so that instead of flowing upward, the blood pools in the lower leg. This is called post-thrombotic syndrome, and it can result in pain, swelling, discoloration, and sores on the leg. CAUSES A DVT is caused by the formation of a blood clot in your leg, pelvis, or arm. Usually, several things contribute to the formation of blood clots. A clot may develop when:  Your blood flow slows down.  Your vein becomes damaged in some way.  You have a condition that makes your blood clot more easily. RISK FACTORS A DVT is more likely to develop in:  People who are older, especially over 56 years of age.  People who are overweight (obese).  People who sit or lie still for a long time, such as during long-distance travel (over 4 hours), bed rest, hospitalization, or during recovery from certain medical conditions like a stroke.  People who do not engage in much physical activity (sedentary lifestyle).  People who have chronic breathing disorders.  People who have a personal or family history of blood clots or blood clotting disease.  People who have peripheral vascular disease (PVD), diabetes, or some types of cancer.  People who have heart disease, especially if the person had a recent heart attack or has  congestive heart failure.  People who have neurological diseases that affect the legs (leg paresis).  People who have had a traumatic injury, such as breaking a hip or leg.  People who have recently had major or lengthy surgery, especially on the hip, knee, or abdomen.  People who have had a central line placed inside a large vein.  People who take medicines that contain the hormone estrogen. These include birth control pills and hormone replacement therapy.  Pregnancy or during childbirth or the postpartum period.  Long plane flights (over 8 hours). SIGNS AND SYMPTOMS Symptoms of a DVT can include:   Swelling of your leg or arm, especially if one side is much worse.  Warmth and redness of your leg or arm, especially if one side is much worse.  Pain in your arm or leg. If the clot is in your leg, symptoms may be more noticeable or worse when you stand or walk.  A feeling of pins and needles, if the clot is in the arm. The symptoms of a DVT that has traveled to the lungs (pulmonary embolism, PE) usually start suddenly and include:  Shortness of breath while active or at rest.  Coughing or coughing up blood or blood-tinged mucus.  Chest pain that is often worse with deep breaths.  Rapid or irregular heartbeat.  Feeling light-headed or dizzy.  Fainting.  Feeling anxious.  Sweating. There may also be pain and swelling in a leg if that is where the blood clot started. These symptoms may represent a  serious problem that is an emergency. Do not wait to see if the symptoms will go away. Get medical help right away. Call your local emergency services (911 in the [place] Do not drive yourself to the hospital. DIAGNOSIS Your health care provider will take a medical history and perform a physical exam. You may also have other tests, including:  Blood tests to assess the clotting properties of your blood.  Imaging tests, such as CT, ultrasound, MRI, X-ray, and other tests to see if  you have clots anywhere in your body. TREATMENT After a DVT is identified, it can be treated. The type of treatment that you receive depends on many factors, such as the cause of your DVT, your risk for bleeding or developing more clots, and other medical conditions that you have. Sometimes, a combination of treatments is necessary. Treatment options may be combined and include:  Monitoring the blood clot with ultrasound.  Taking medicines by mouth, such as newer blood thinners (anticoagulants), thrombolytics, or warfarin.  Taking anticoagulant medicine by injection or through an IV tube.  Wearing compression stockings or using different types ofdevices.  Surgery (rare) to remove the blood clot or to place a filter in your abdomen to stop the blood clot from traveling to your lungs. Treatments for a DVT are often divided into immediate treatment and long-term treatment (up to 3 months after DVT). You can work with your health care provider to choose the treatment program that is best for you. HOME CARE INSTRUCTIONS If you are taking a newer oral anticoagulant:  Take the medicine every single day at the same time each day.  Understand what foods and drugs interact with this medicine.  Understand that there are no regular blood tests required when using this medicine.  Understand the side effects of this medicine, including excessive bruising or bleeding. Ask your health care provider or pharmacist about other possible side effects. If you are taking warfarin:  Understand how to take warfarin and know which foods can affect how warfarin works in Veterinary surgeon.  Understand that it is dangerous to take too much or too little warfarin. Too much warfarin increases the risk of bleeding. Too little warfarin continues to allow the risk for blood clots.  Follow your PT and INR blood testing schedule. The PT and INR results allow your health care provider to adjust your dose of warfarin. It is very  important that you have your PT and INR tested as often as told by your health care provider.  Avoid major changes in your diet, or tell your health care provider before you change your diet. Arrange a visit with a registered dietitian to answer your questions. Many foods, especially foods that are high in vitamin K, can interfere with warfarin and affect the PT and INR results. Eat a consistent amount of foods that are high in vitamin K, such as:  Spinach, kale, broccoli, cabbage, collard greens, turnip greens, Brussels sprouts, peas, cauliflower, seaweed, and parsley.  Beef liver and pork liver.  Green tea.  Soybean oil.  Tell your health care provider about any and all medicines, vitamins, and supplements that you take, including aspirin and other over-the-counter anti-inflammatory medicines. Be especially cautious with aspirin and anti-inflammatory medicines. Do not take those before you ask your health care provider if it is safe to do so. This is important because many medicines can interfere with warfarin and affect the PT and INR results.  Do not start or stop taking any over-the-counter or  prescription medicine unless your health care provider or pharmacist tells you to do so. If you take warfarin, you will also need to do these things:  Hold pressure over cuts for longer than usual.  Tell your dentist and other health care providers that you are taking warfarin before you have any procedures in which bleeding may occur.  Avoid alcohol or drink very small amounts. Tell your health care provider if you change your alcohol intake.  Do not use tobacco products, including cigarettes, chewing tobacco, and e-cigarettes. If you need help quitting, ask your health care provider.  Avoid contact sports. General Instructions  Take over-the-counter and prescription medicines only as told by your health care provider. Anticoagulant medicines can have side effects, including easy bruising and  difficulty stopping bleeding. If you are prescribed an anticoagulant, you will also need to do these things:  Hold pressure over cuts for longer than usual.  Tell your dentist and other health care providers that you are taking anticoagulants before you have any procedures in which bleeding may occur.  Avoid contact sports.  Wear a medical alert bracelet or carry a medical alert card that says you have had a PE.  Ask your health care provider how soon you can go back to your normal activities. Stay active to prevent new blood clots from forming.  Make sure to exercise while traveling or when you have been sitting or standing for a long period of time. It is very important to exercise. Exercise your legs by walking or by tightening and relaxing your leg muscles often. Take frequent walks.  Wear compression stockings as told by your health care provider to help prevent more blood clots from forming.  Do not use tobacco products, including cigarettes, chewing tobacco, and e-cigarettes. If you need help quitting, ask your health care provider.  Keep all follow-up appointments with your health care provider. This is important. PREVENTION Take these actions to decrease your risk of developing another DVT:  Exercise regularly. For at least 30 minutes every day, engage in:  Activity that involves moving your arms and legs.  Activity that encourages good blood flow through your body by increasing your heart rate.  Exercise your arms and legs every hour during long-distance travel (over 4 hours). Drink plenty of water and avoid drinking alcohol while traveling.  Avoid sitting or lying in bed for long periods of time without moving your legs.  Maintain a weight that is appropriate for your height. Ask your health care provider what weight is healthy for you.  If you are a woman who is over 57 years of age, avoid unnecessary use of medicines that contain estrogen. These include birth control  pills.  Do not smoke, especially if you take estrogen medicines. If you need help quitting, ask your health care provider. If you are hospitalized, prevention measures may include:  Early walking after surgery, as soon as your health care provider says that it is safe.  Receiving anticoagulants to prevent blood clots.If you cannot take anticoagulants, other options may be available, such as wearing compression stockings or using different types of devices. SEEK IMMEDIATE MEDICAL CARE IF:  You have new or increased pain, swelling, or redness in an arm or leg.  You have numbness or tingling in an arm or leg.  You have shortness of breath while active or at rest.  You have chest pain.  You have a rapid or irregular heartbeat.  You feel light-headed or dizzy.  You cough up  blood.  You notice blood in your vomit, bowel movement, or urine. These symptoms may represent a serious problem that is an emergency. Do not wait to see if the symptoms will go away. Get medical help right away. Call your local emergency services (911 in the [place] Do not drive yourself to the hospital.   This information is not intended to replace advice given to you by your health care provider. Make sure you discuss any questions you have with your health care provider.   Document Released: 05/05/2005 Document Revised: 01/24/2015 Document Reviewed: 08/30/2014 Elsevier Interactive Patient Education [employer].

## 2016-01-04 DIAGNOSIS — I82431 Acute embolism and thrombosis of right popliteal vein: Secondary | ICD-10-CM | POA: Insufficient documentation

## 2016-01-24 DIAGNOSIS — I82401 Acute embolism and thrombosis of unspecified deep veins of right lower extremity: Secondary | ICD-10-CM | POA: Insufficient documentation

## 2016-01-24 NOTE — Progress Notes (Signed)
[facility]  Telephone:([phone]) [phone] Fax:(336) [phone]  ID: Sharon [name] OB: Nov 19, 1960  MR#: [id number]  [id number]  Patient Care Team: Ricardo Jericho, NP as PCP - General (Family Medicine)  CHIEF COMPLAINT: Acute DVT of the popliteal vein of the right lower extremity.  INTERVAL HISTORY: Patient is a 55 year old female who was diagnosed with a right lower extremity DVT approximately one week after shoulder surgery. Patient reports surgery was difficult and she was in the OR for an extended period time, but was mobile and back on her feet shortly thereafter. She developed right leg pain and subsequently diagnosed with DVT. She has been on anticoagulation since July 2017. She continues to have mild right leg pain, but otherwise feels well. She has no neurologic complaints. She denies any recent fevers or illnesses. She has no chest pain or shortness of breath. She has good appetite and denies weight loss. She has no nausea, vomiting, constipation, or diarrhea. She has no urinary complaints. Patient otherwise feels well and offers no further specific complaints.  REVIEW OF SYSTEMS:   Review of Systems  Constitutional: Negative.  Negative for fever, malaise/fatigue and weight loss.  Respiratory: Negative.  Negative for cough and shortness of breath.   Cardiovascular: Positive for leg swelling. Negative for chest pain.  Gastrointestinal: Negative.  Negative for abdominal pain.  Genitourinary: Negative.   Musculoskeletal: Positive for joint pain.  Neurological: Negative.  Negative for weakness.  Endo/Heme/Allergies: Does not bruise/bleed easily.  Psychiatric/Behavioral: Negative.  The patient is not nervous/anxious.     As per HPI. Otherwise, a complete review of systems is negative.  PAST MEDICAL HISTORY: Past Medical History:  Diagnosis Date  . Acid reflux   . Anxiety   . Arthritis   . Bladder spasm   . Depression   . Headache   . Kidney calculi    . Left ureteral stone   . Leg DVT (deep venous thromboembolism), acute ([place])   . Restless leg syndrome   . Sleep apnea    does not have C-PAP @ present  . Urge incontinence     PAST SURGICAL HISTORY: Past Surgical History:  Procedure Laterality Date  . ABDOMINAL HYSTERECTOMY    . CHOLECYSTECTOMY    . KIDNEY STONE SURGERY    . KNEE ARTHROSCOPY Right    X 2  . SHOULDER ARTHROSCOPY WITH OPEN ROTATOR CUFF REPAIR Right 11/22/2015   Procedure: SHOULDER ARTHROSCOPY, DEBRIDEMENT, DECOMPRESSION, SLAP REPAIR, POSSIBLE BICEP TENODESIS;  Surgeon: Corky Mull, MD;  Location: [facility] ORS;  Service: Orthopedics;  Laterality: Right;  . SINUS EXPLORATION      FAMILY HISTORY: Family History  Problem Relation Age of Onset  . Cancer Mother   . Lung cancer Father   . Hematuria Brother   . Kidney disease Paternal Grandfather   . Bladder Cancer Neg Hx   . Kidney Stones Brother     ADVANCED DIRECTIVES (Y/N):  N  HEALTH MAINTENANCE: Social History  Substance Use Topics  . Smoking status: Never Smoker  . Smokeless tobacco: Never Used  . Alcohol use No     Colonoscopy:  PAP:  Bone density:  Lipid panel:  Allergies  Allergen Reactions  . Alcohol Other (See Comments)    wool  . Bacitracin-Neomycin-Polymyxin Other (See Comments)  . Codeine Nausea Only and Other (See Comments)    "headache"  . Hydrocodone Other (See Comments)  . Hydrocortisone Other (See Comments)  . Lanolin Other (See Comments)    "rash"  . Nickel  Other (See Comments)  . Tape Other (See Comments)  . Tramadol Other (See Comments)    "headache"  . Bacitracin Rash  . Clarithromycin Rash  . Latex Rash  . Oxycodone Rash  . Penicillins Rash    Has patient had a PCN reaction causing immediate rash, facial/tongue/throat swelling, SOB or lightheadedness with hypotension: yes Has patient had a PCN reaction causing severe rash involving mucus membranes or skin necrosis: no Has patient had a PCN reaction that required  hospitalization No Has patient had a PCN reaction occurring within the last 10 years: No If all of the above answers are "NO", then may proceed with Cephalosporin use.   . Sulfamethoxazole-Trimethoprim Rash    Current Outpatient Prescriptions  Medication Sig Dispense Refill  . apixaban (ELIQUIS) 5 MG TABS tablet 10mg  twice per day for 7 days then 5mg  twice per day 70 tablet 0  . cyclobenzaprine (FLEXERIL) 10 MG tablet Take 10 mg by mouth 3 (three) times daily as needed for muscle spasms.    Marland Kitchen doxepin (SINEQUAN) 10 MG capsule Take 10 mg by mouth at bedtime.     Marland Kitchen EPINEPHrine (EPIPEN 2-PAK) 0.3 mg/0.3 mL IJ SOAJ injection Inject 0.3 mg into the muscle as needed. For anaphylaxis    . fluticasone (FLONASE) 50 MCG/ACT nasal spray Place 1 spray into both nostrils 2 (two) times daily. (Patient not taking: Reported on 12/03/2015) 16 g 0  . levocetirizine (XYZAL) 5 MG tablet Take 5 mg by mouth every evening.    . linaclotide (LINZESS) 290 MCG CAPS capsule Take 290 mcg by mouth daily before breakfast.    . montelukast (SINGULAIR) 10 MG tablet Take 10 mg by mouth at bedtime.    Marland Kitchen morphine (MSIR) 15 MG tablet Take 1 tablet (15 mg total) by mouth every 4 (four) hours as needed for severe pain. (Patient taking differently: Take 15 mg by mouth every 3 (three) hours as needed for severe pain. ) 50 tablet 0  . nabumetone (RELAFEN) 500 MG tablet Take 500 mg by mouth 2 (two) times daily.  3  . Olopatadine HCl (PATADAY) 0.2 % SOLN Place 1 drop into both eyes 2 (two) times daily as needed. For dry eyes    . omeprazole (PRILOSEC) 40 MG capsule Take 40 mg by mouth 2 (two) times daily.  3   No current facility-administered medications for this visit.     OBJECTIVE: There were no vitals filed for this visit.   There is no height or weight on file to calculate BMI.    ECOG FS:0 - Asymptomatic  General: Well-developed, well-nourished, no acute distress. Eyes: Pink conjunctiva, anicteric sclera. HEENT:  Normocephalic, moist mucous membranes, clear oropharnyx. Lungs: Clear to auscultation bilaterally. Heart: Regular rate and rhythm. No rubs, murmurs, or gallops. Abdomen: Soft, nontender, nondistended. No organomegaly noted, normoactive bowel sounds. Musculoskeletal: No edema, cyanosis, or clubbing. Neuro: Alert, answering all questions appropriately. Cranial nerves grossly intact. Skin: No rashes or petechiae noted. Psych: Normal affect. Lymphatics: No cervical, calvicular, axillary or inguinal LAD.   LAB RESULTS:  Lab Results  Component Value Date   NA 142 06/21/2014   K 3.7 06/21/2014   CL 109 (H) 06/21/2014   CO2 27 06/21/2014   GLUCOSE 129 (H) 06/21/2014   BUN 17 06/21/2014   CREATININE 0.91 06/21/2014   CALCIUM 8.2 (L) 06/21/2014   PROT 7.4 06/20/2014   ALBUMIN 3.5 06/20/2014   AST 300 (H) 06/20/2014   ALT 186 (H) 06/20/2014   ALKPHOS 114 06/20/2014  BILITOT 1.2 (H) 06/20/2014   GFRNONAA >60 06/21/2014   GFRAA >60 06/21/2014    Lab Results  Component Value Date   WBC 7.6 11/08/2015   NEUTROABS 3.9 11/08/2015   HGB 13.4 11/08/2015   HCT 38.9 11/08/2015   MCV 86.8 11/08/2015   PLT 212 11/08/2015     STUDIES: No results found.  ASSESSMENT: Acute DVT of the popliteal vein of the right lower extremity.  PLAN:    1. Acute DVT of the popliteal vein of the right lower extremity: Although unusual, this is likely secondary to transient risk factor of right shoulder surgery. Will proceed with full hypercoagulable workup today for completeness. Patient will return to clinic on March 07, 2016 to discuss the results and to determine if she requires more than 3 months of treatment. She has been instructed to continue Eliquis as prescribed.  Patient expressed understanding and was in agreement with this plan. She also understands that She can call clinic at any time with any questions, concerns, or complaints.    Lloyd Huger, MD   01/24/2016 10:31 PM

## 2016-01-25 ENCOUNTER — Encounter: Payer: Self-pay | Admitting: Oncology

## 2016-01-25 ENCOUNTER — Inpatient Hospital Stay: Payer: BLUE CROSS/BLUE SHIELD | Attending: Oncology | Admitting: Oncology

## 2016-01-25 ENCOUNTER — Other Ambulatory Visit: Payer: Self-pay

## 2016-01-25 ENCOUNTER — Inpatient Hospital Stay: Payer: BLUE CROSS/BLUE SHIELD

## 2016-01-25 VITALS — BP 128/76 | HR 84 | Temp 98.0°F | Ht 66.0 in | Wt 252.0 lb

## 2016-01-25 DIAGNOSIS — Z8052 Family history of malignant neoplasm of bladder: Secondary | ICD-10-CM | POA: Diagnosis not present

## 2016-01-25 DIAGNOSIS — I82431 Acute embolism and thrombosis of right popliteal vein: Secondary | ICD-10-CM | POA: Diagnosis not present

## 2016-01-25 DIAGNOSIS — Z79899 Other long term (current) drug therapy: Secondary | ICD-10-CM | POA: Diagnosis not present

## 2016-01-25 DIAGNOSIS — Z87442 Personal history of urinary calculi: Secondary | ICD-10-CM

## 2016-01-25 DIAGNOSIS — M1990 Unspecified osteoarthritis, unspecified site: Secondary | ICD-10-CM | POA: Diagnosis not present

## 2016-01-25 DIAGNOSIS — N200 Calculus of kidney: Secondary | ICD-10-CM

## 2016-01-25 DIAGNOSIS — Z801 Family history of malignant neoplasm of trachea, bronchus and lung: Secondary | ICD-10-CM

## 2016-01-25 DIAGNOSIS — M79604 Pain in right leg: Secondary | ICD-10-CM | POA: Diagnosis not present

## 2016-01-25 DIAGNOSIS — G4730 Sleep apnea, unspecified: Secondary | ICD-10-CM

## 2016-01-25 DIAGNOSIS — Z7901 Long term (current) use of anticoagulants: Secondary | ICD-10-CM

## 2016-01-25 DIAGNOSIS — K219 Gastro-esophageal reflux disease without esophagitis: Secondary | ICD-10-CM

## 2016-01-25 LAB — ANTITHROMBIN III: AntiThromb III Func: 97 % (ref 75–120)

## 2016-01-25 NOTE — Progress Notes (Signed)
Patient here for follow up for DVT. She still has some pain in her right leg behind the knee.

## 2016-01-26 LAB — BETA-2-GLYCOPROTEIN I ABS, IGG/M/A
Beta-2 Glyco I IgG: <9 GPI IgG units (ref 0–20)
Beta-2-Glycoprotein I IgA: <9 GPI IgA units (ref 0–25)
Beta-2-Glycoprotein I IgM: <9 GPI IgM units (ref 0–32)

## 2016-01-26 LAB — PROTEIN S ACTIVITY: Protein S Activity: 126 % (ref 63–140)

## 2016-01-26 LAB — CARDIOLIPIN ANTIBODIES, IGG, IGM, IGA
Anticardiolipin IgA: <9 APL U/mL (ref 0–11)
Anticardiolipin IgG: <9 GPL U/mL (ref 0–14)
Anticardiolipin IgM: <9 MPL U/mL (ref 0–12)

## 2016-01-26 LAB — PROTEIN S, TOTAL: Protein S Ag, Total: 123 % (ref 60–150)

## 2016-01-26 LAB — PROTEIN C ACTIVITY: Protein C Activity: 165 % (ref 73–180)

## 2016-01-27 LAB — PROTEIN C, TOTAL: Protein C, Total: 108 % (ref 60–150)

## 2016-01-28 LAB — LUPUS ANTICOAGULANT PANEL
DRVVT: 75.1 s — ABNORMAL HIGH (ref 0.0–47.0)
PTT Lupus Anticoagulant: 40.5 s (ref 0.0–51.9)

## 2016-01-28 LAB — DRVVT MIX: dRVVT Mix: 52.8 s — ABNORMAL HIGH (ref 0.0–47.0)

## 2016-01-28 LAB — HOMOCYSTEINE: Homocysteine: 8.4 umol/L (ref 0.0–15.0)

## 2016-01-28 LAB — DRVVT CONFIRM: dRVVT Confirm: 1.1 ratio (ref 0.8–1.2)

## 2016-01-29 LAB — FACTOR 5 LEIDEN

## 2016-01-30 LAB — PROTHROMBIN GENE MUTATION

## 2016-03-05 NOTE — Progress Notes (Signed)
[place]  [phone]) [phone] [phone]) [phone]  ID: Sharon [name] OB: 03-Aug-1960  MR#: [id number]  [id number]  Patient Care Team: Ricardo Jericho, NP as PCP - General (Family Medicine)  CHIEF COMPLAINT: Acute DVT of the popliteal vein of the right lower extremity.  INTERVAL HISTORY: Patient returns to clinic today for further evaluation and discussion of her laboratory work. She currently feels well and is asymptomatic.  She is tolerating Eliquis well with out significant side effects. She has no neurologic complaints. She denies any recent fevers or illnesses. She has no chest pain or shortness of breath. She has a good appetite and denies weight loss. She has no nausea, vomiting, constipation, or diarrhea. She has no urinary complaints. Patient offers no specific complaints today.  REVIEW OF SYSTEMS:   Review of Systems  Constitutional: Negative.  Negative for fever, malaise/fatigue and weight loss.  Respiratory: Negative.  Negative for cough and shortness of breath.   Cardiovascular: Negative for chest pain and leg swelling.  Gastrointestinal: Negative.  Negative for abdominal pain.  Genitourinary: Negative.   Musculoskeletal: Negative.  Negative for joint pain.  Neurological: Negative.  Negative for weakness.  Endo/Heme/Allergies: Does not bruise/bleed easily.  Psychiatric/Behavioral: Negative.  The patient is not nervous/anxious.     As per HPI. Otherwise, a complete review of systems is negative.  PAST MEDICAL HISTORY: Past Medical History:  Diagnosis Date  . Acid reflux   . Anxiety   . Arthritis   . Bladder spasm   . Depression   . Headache   . Kidney calculi   . Left ureteral stone   . Leg DVT (deep venous thromboembolism), acute ([place])   . Restless leg syndrome   . Sleep apnea    does not have C-PAP @ present  . Urge incontinence     PAST SURGICAL HISTORY: Past Surgical History:  Procedure Laterality Date  .  ABDOMINAL HYSTERECTOMY    . CHOLECYSTECTOMY    . KIDNEY STONE SURGERY    . KNEE ARTHROSCOPY Right    X 2  . SHOULDER ARTHROSCOPY WITH OPEN ROTATOR CUFF REPAIR Right 11/22/2015   Procedure: SHOULDER ARTHROSCOPY, DEBRIDEMENT, DECOMPRESSION, SLAP REPAIR, POSSIBLE BICEP TENODESIS;  Surgeon: Corky Mull, MD;  Location: [facility] ORS;  Service: Orthopedics;  Laterality: Right;  . SINUS EXPLORATION      FAMILY HISTORY: Family History  Problem Relation Age of Onset  . Cancer Mother   . Lung cancer Father   . Hematuria Brother   . Kidney disease Paternal Grandfather   . Kidney Stones Brother   . Bladder Cancer Neg Hx     ADVANCED DIRECTIVES (Y/N):  N  HEALTH MAINTENANCE: Social History  Substance Use Topics  . Smoking status: Never Smoker  . Smokeless tobacco: Never Used  . Alcohol use No     Colonoscopy:  PAP:  Bone density:  Lipid panel:  Allergies  Allergen Reactions  . Alcohol Other (See Comments)    wool  . Bacitracin-Neomycin-Polymyxin Other (See Comments)  . Codeine Nausea Only and Other (See Comments)    "headache"  . Hydrocodone Other (See Comments)  . Hydrocortisone Other (See Comments)  . Lanolin Other (See Comments)    "rash"  . Nickel Other (See Comments)  . Tape Other (See Comments)  . Tramadol Other (See Comments)    "headache"  . Bacitracin Rash  . Clarithromycin Rash  . Latex Rash  . Oxycodone Rash  . Penicillins Rash    Has patient had a  PCN reaction causing immediate rash, facial/tongue/throat swelling, SOB or lightheadedness with hypotension: yes Has patient had a PCN reaction causing severe rash involving mucus membranes or skin necrosis: no Has patient had a PCN reaction that required hospitalization No Has patient had a PCN reaction occurring within the last 10 years: No If all of the above answers are "NO", then may proceed with Cephalosporin use.   . Sulfamethoxazole-Trimethoprim Rash    Current Outpatient Prescriptions  Medication Sig  Dispense Refill  . apixaban (ELIQUIS) 5 MG TABS tablet 10mg  twice per day for 7 days then 5mg  twice per day 70 tablet 0  . EPINEPHrine (EPIPEN 2-PAK) 0.3 mg/0.3 mL IJ SOAJ injection Inject 0.3 mg into the muscle as needed. For anaphylaxis    . levocetirizine (XYZAL) 5 MG tablet Take 5 mg by mouth every evening.    . montelukast (SINGULAIR) 10 MG tablet Take 10 mg by mouth at bedtime.    . Olopatadine HCl (PATADAY) 0.2 % SOLN Place 1 drop into both eyes 2 (two) times daily as needed. For dry eyes    . omeprazole (PRILOSEC) 40 MG capsule Take 40 mg by mouth 2 (two) times daily.  3   No current facility-administered medications for this visit.     OBJECTIVE: Vitals:   03/07/16 0912  BP: 125/78  Pulse: 76  Resp: 20  Temp: 97.2 F (36.2 C)     Body mass index is 41.28 kg/m.    ECOG FS:0 - Asymptomatic  General: Well-developed, well-nourished, no acute distress. Eyes: Pink conjunctiva, anicteric sclera. Lungs: Clear to auscultation bilaterally. Heart: Regular rate and rhythm. No rubs, murmurs, or gallops. Abdomen: Soft, nontender, nondistended. No organomegaly noted, normoactive bowel sounds. Musculoskeletal: No edema, cyanosis, or clubbing. Neuro: Alert, answering all questions appropriately. Cranial nerves grossly intact. Skin: No rashes or petechiae noted. Psych: Normal affect.   LAB RESULTS:  Lab Results  Component Value Date   NA 142 06/21/2014   K 3.7 06/21/2014   CL 109 (H) 06/21/2014   CO2 27 06/21/2014   GLUCOSE 129 (H) 06/21/2014   BUN 17 06/21/2014   CREATININE 0.91 06/21/2014   CALCIUM 8.2 (L) 06/21/2014   PROT 7.4 06/20/2014   ALBUMIN 3.5 06/20/2014   AST 300 (H) 06/20/2014   ALT 186 (H) 06/20/2014   ALKPHOS 114 06/20/2014   BILITOT 1.2 (H) 06/20/2014   GFRNONAA >60 06/21/2014   GFRAA >60 06/21/2014    Lab Results  Component Value Date   WBC 7.6 11/08/2015   NEUTROABS 3.9 11/08/2015   HGB 13.4 11/08/2015   HCT 38.9 11/08/2015   MCV 86.8 11/08/2015     PLT 212 11/08/2015     STUDIES: No results found.  ASSESSMENT: Acute DVT of the popliteal vein of the right lower extremity.  PLAN:    1. Acute DVT of the popliteal vein of the right lower extremity: Although unusual, this was likely secondary to transient risk factor of right shoulder surgery. Full hypercoagulable workup was negative except for an elevated DRVVT. Repeat laboratory work after discontinuation of Eliquis was normal.  Patient does not require further anticoagulation.  No further follow up is necessary.   Patient expressed understanding and was in agreement with this plan. She also understands that She can call clinic at any time with any questions, concerns, or complaints.    Lloyd Huger, MD   03/14/2016 8:59 PM

## 2016-03-07 ENCOUNTER — Inpatient Hospital Stay: Payer: BLUE CROSS/BLUE SHIELD | Attending: Oncology | Admitting: Oncology

## 2016-03-07 VITALS — BP 125/78 | HR 76 | Temp 97.2°F | Resp 20 | Wt 255.7 lb

## 2016-03-07 DIAGNOSIS — G4730 Sleep apnea, unspecified: Secondary | ICD-10-CM | POA: Diagnosis not present

## 2016-03-07 DIAGNOSIS — Z8052 Family history of malignant neoplasm of bladder: Secondary | ICD-10-CM | POA: Diagnosis not present

## 2016-03-07 DIAGNOSIS — K219 Gastro-esophageal reflux disease without esophagitis: Secondary | ICD-10-CM

## 2016-03-07 DIAGNOSIS — I82431 Acute embolism and thrombosis of right popliteal vein: Secondary | ICD-10-CM

## 2016-03-07 DIAGNOSIS — M1990 Unspecified osteoarthritis, unspecified site: Secondary | ICD-10-CM | POA: Insufficient documentation

## 2016-03-07 DIAGNOSIS — G2581 Restless legs syndrome: Secondary | ICD-10-CM | POA: Diagnosis not present

## 2016-03-07 DIAGNOSIS — Z7901 Long term (current) use of anticoagulants: Secondary | ICD-10-CM | POA: Diagnosis not present

## 2016-03-07 DIAGNOSIS — Z880 Allergy status to penicillin: Secondary | ICD-10-CM | POA: Diagnosis not present

## 2016-03-07 DIAGNOSIS — Z8051 Family history of malignant neoplasm of kidney: Secondary | ICD-10-CM | POA: Diagnosis not present

## 2016-03-07 DIAGNOSIS — Z79899 Other long term (current) drug therapy: Secondary | ICD-10-CM | POA: Insufficient documentation

## 2016-03-07 DIAGNOSIS — Z801 Family history of malignant neoplasm of trachea, bronchus and lung: Secondary | ICD-10-CM | POA: Insufficient documentation

## 2016-03-07 NOTE — Progress Notes (Signed)
Patient here today for follow up regarding DVT, anticoagulation. Patient reports she has intermittent episodes of chest pain on the right side, is also still going through physical therapy for her right shoulder.

## 2016-03-12 ENCOUNTER — Inpatient Hospital Stay: Payer: BLUE CROSS/BLUE SHIELD

## 2016-03-12 ENCOUNTER — Other Ambulatory Visit: Payer: Self-pay | Admitting: *Deleted

## 2016-03-12 DIAGNOSIS — I82431 Acute embolism and thrombosis of right popliteal vein: Secondary | ICD-10-CM | POA: Diagnosis not present

## 2016-03-13 LAB — LUPUS ANTICOAGULANT PANEL
DRVVT: 37.3 s (ref 0.0–47.0)
PTT Lupus Anticoagulant: 34.1 s (ref 0.0–51.9)

## 2016-05-15 HISTORY — PX: ESOPHAGOGASTRODUODENOSCOPY: SHX1529

## 2016-05-15 HISTORY — PX: BARIATRIC SURGERY: SHX1103

## 2016-07-08 ENCOUNTER — Inpatient Hospital Stay: Payer: BLUE CROSS/BLUE SHIELD | Attending: Internal Medicine | Admitting: Internal Medicine

## 2016-07-08 ENCOUNTER — Encounter: Payer: Self-pay | Admitting: Internal Medicine

## 2016-07-08 ENCOUNTER — Inpatient Hospital Stay: Payer: BLUE CROSS/BLUE SHIELD

## 2016-07-08 VITALS — BP 110/73 | HR 62 | Temp 97.8°F | Resp 20 | Ht 66.0 in | Wt 226.3 lb

## 2016-07-08 DIAGNOSIS — Z9884 Bariatric surgery status: Secondary | ICD-10-CM | POA: Diagnosis not present

## 2016-07-08 DIAGNOSIS — Z86718 Personal history of other venous thrombosis and embolism: Secondary | ICD-10-CM | POA: Diagnosis not present

## 2016-07-08 DIAGNOSIS — Z5181 Encounter for therapeutic drug level monitoring: Secondary | ICD-10-CM | POA: Insufficient documentation

## 2016-07-08 DIAGNOSIS — Z7901 Long term (current) use of anticoagulants: Secondary | ICD-10-CM | POA: Diagnosis not present

## 2016-07-08 DIAGNOSIS — I82431 Acute embolism and thrombosis of right popliteal vein: Secondary | ICD-10-CM

## 2016-07-08 LAB — CBC WITH DIFFERENTIAL/PLATELET
Basophils Absolute: 0 10*3/uL (ref 0–0.1)
Basophils Relative: 1 %
Eosinophils Absolute: 0.2 10*3/uL (ref 0–0.7)
Eosinophils Relative: 3 %
HCT: 39 % (ref 35.0–47.0)
Hemoglobin: 12.8 g/dL (ref 12.0–16.0)
Lymphocytes Relative: 46 %
Lymphs Abs: 2.1 10*3/uL (ref 1.0–3.6)
MCH: 27.9 pg (ref 26.0–34.0)
MCHC: 32.7 g/dL (ref 32.0–36.0)
MCV: 85.3 fL (ref 80.0–100.0)
Monocytes Absolute: 0.5 10*3/uL (ref 0.2–0.9)
Monocytes Relative: 10 %
Neutro Abs: 1.8 10*3/uL (ref 1.4–6.5)
Neutrophils Relative %: 40 %
Platelets: 243 10*3/uL (ref 150–440)
RBC: 4.58 MIL/uL (ref 3.80–5.20)
RDW: 14.5 % (ref 11.5–14.5)
WBC: 4.6 10*3/uL (ref 3.6–11.0)

## 2016-07-08 LAB — PROTIME-INR
INR: 1.41
Prothrombin Time: 17.4 seconds — ABNORMAL HIGH (ref 11.4–15.2)

## 2016-07-08 LAB — COMPREHENSIVE METABOLIC PANEL
ALT: 23 U/L (ref 14–54)
AST: 24 U/L (ref 15–41)
Albumin: 4.1 g/dL (ref 3.5–5.0)
Alkaline Phosphatase: 53 U/L (ref 38–126)
Anion gap: 7 (ref 5–15)
BUN: 16 mg/dL (ref 6–20)
CO2: 27 mmol/L (ref 22–32)
Calcium: 8.9 mg/dL (ref 8.9–10.3)
Chloride: 105 mmol/L (ref 101–111)
Creatinine, Ser: 0.48 mg/dL (ref 0.44–1.00)
GFR calc Af Amer: >60 mL/min (ref >60)
GFR calc non Af Amer: >60 mL/min (ref >60)
Glucose, Bld: 101 mg/dL — ABNORMAL HIGH (ref 65–99)
Potassium: 4 mmol/L (ref 3.5–5.1)
Sodium: 139 mmol/L (ref 135–145)
Total Bilirubin: 0.4 mg/dL (ref 0.3–1.2)
Total Protein: 7 g/dL (ref 6.5–8.1)

## 2016-07-08 MED ORDER — APIXABAN 5 MG PO TABS: 10.0000 mg | ORAL_TABLET | Freq: Two times a day (BID) | ORAL | 0 refills | Status: DC

## 2016-07-08 MED ORDER — APIXABAN 2.5 MG PO TABS: 5.0000 mg | ORAL_TABLET | Freq: Two times a day (BID) | ORAL | Status: DC

## 2016-07-08 MED ORDER — APIXABAN 5 MG PO TABS: 5.0000 mg | ORAL_TABLET | Freq: Two times a day (BID) | ORAL | 1 refills | Status: DC

## 2016-07-08 MED ORDER — APIXABAN 2.5 MG PO TABS: 10.0000 mg | ORAL_TABLET | Freq: Two times a day (BID) | ORAL | Status: DC

## 2016-07-08 NOTE — Progress Notes (Signed)
Patient here today as an acute add on/walk in. She requesting an urgent apt with hematologist to manage her inr range as she is on comadin. She states that she desires to come off her lovenox as soon as possible as this is causing painful knots and bruising on abdomen.  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 I explained to the patient that Dr. Grayland Ormond is in [place] today. She asked for an apt asap with hematologist. Dr. Grayland Ormond is not available in [facility] clinic this Friday. Pt not willing to wait to be seen and would like to be worked in on provider's schedule today in [place] if possible.  Chart review- pt last evaluated in cancer ctr in Oct 2017. She reports a history of a dvt in popliteal vein of the right lower extremity s/p R shoulder surgery. She recently underwent gastric bypass surgery. Her Eliquis was d/c prior to surgery.  Pt never started back on Eliquis s/p hp d/c and was given rx for coumadin and lovenox bridge, which has been taking since December. Her pcp referred to the [place] clinic at [facility], who has been managing her inr levels. Per patient, she states that she "frustrated at not reaching a therapeutic level in the inr values and worries about developing a potential blood clot."  According to the patient, the coagulation clinic told the patient "to make an apt with the hematologist asap." chart reviewed in care everywhere. inr values have been as follows:  INR 1.3 07/07/2016  INR 1.2 06/30/2016  INR 1.7 06/23/2016  INR 1.5 06/20/2016     Patient states that her provider would like to increase her coumadin to 10 mg a day starting on Thursday this week/alternating with 7.5 mg + lovenox injections daily. Pt voices concerns that "this is too much coumadin." reviewed [facility]-external pharmacy. Pt states that she is taking Lovenox 120 mg/0.8 ml injection subcutaneous every 12 hrs. However, I pointed out to the patient that her prescription for  her lovenox dosing stated 0.7 ml every 12 hrs. She states that she is "self administrating the 0.8 ml injections" and not wasting the 0.1 ml. She states that she was "not instructed to waste the dose before self administering the lovenox."

## 2016-07-08 NOTE — Assessment & Plan Note (Signed)
1. Acute DVT of the popliteal vein of the right lower extremity: Although unusual, this was likely secondary to transient risk factor of right shoulder surgery. Full hypercoagulable workup was negative except for an elevated DRVVT. Repeat laboratory work after discontinuation of Eliquis on 03/14/16 was normal.  Patient discharged from clinic in October 2017 with no need for further anticoagulation or follow up.   Patient currently on lovenox and coumadin s/p gastric bypass on 06/16/15, managed by [facility].  INR 1.3 on 07/07/16, continued on coumadin titration to goal of 2-3.  Plan to continue therapy through end of April.  Discontinue lovenox and coumadin starting today, and start taking Eliquis.  Prescription for Eliquis 10mg  BID x7days, then 5mg  BID x60days through end of April sent to pharmacy.  Discussed plan with PCP Ricardo Jericho, NP and she was in agreement with this plan.  Patient expressed understanding and was in agreement with this plan. She also understands that she can call clinic at any time with any questions, concerns, or complaints.

## 2016-07-08 NOTE — Progress Notes (Signed)
[place]  Telephone:(336) [phone] Fax:(336) [phone]  ID: Zebedee Iba OB: [birth year]  MR#: [id number]  [id number]  Patient Care Team: Ricardo Jericho, NP as PCP - General (Family Medicine)  CHIEF COMPLAINT: Acute DVT of the popliteal vein of the right lower extremity.  INTERVAL HISTORY: Patient returns to clinic today for further evaluation and discussion of her current anticoagualation regimen. Her abdomen is tender, bruised, and knotted after numerous daily lovenox injections.  She bruises easily.  She would like to discontinue her current regimen.    Patient was started on lovenox by [facility] for 30 days initially after gastric bypass on 05/15/16, then continued as bridge while adding coumadin at the end of January 2018, due to not reaching INR goal between 2 and 3, then 1.2.  The plan per the patient was to titrate coumadin, discontinue Lovenox and continue therapy through the end of April.  However, she has continued on both lovenox and coumadin as her most recent INR was 1.3 on 07/07/16.   She had previously taken Eliquis from July to October 2017 after developing right popliteal DVT on December 03, 2015 status post shoulder surgery on November 24, 2015.  It was discontinued as scheduled on 03/14/16 after 3 months of therapy.  She otherwise feels well and is asymptomatic.  She was previously tolerating Eliquis well without significant side effects. She has no neurologic complaints. She denies any recent fevers or illnesses. She has no chest pain or shortness of breath. She has a fair appetite and reports intentional weight loss after gastric bypass. She has no nausea, vomiting, constipation, or diarrhea. She has no urinary complaints. Patient offers no further specific complaints today.  REVIEW OF SYSTEMS:   Review of Systems  Constitutional: Positive for weight loss. Negative for fever and malaise/fatigue.  Respiratory: Negative.  Negative for cough and  shortness of breath.   Cardiovascular: Negative for chest pain and leg swelling.  Gastrointestinal: Positive for abdominal pain. Negative for blood in stool, constipation, diarrhea, nausea and vomiting.  Genitourinary: Negative.   Musculoskeletal: Negative.  Negative for joint pain.  Neurological: Negative.  Negative for weakness.  Endo/Heme/Allergies: Bruises/bleeds easily.  Psychiatric/Behavioral: Negative.  The patient is not nervous/anxious and does not have insomnia.     As per HPI. Otherwise, a complete review of systems is negative.  PAST MEDICAL HISTORY: Past Medical History:  Diagnosis Date  . Acid reflux   . Anxiety   . Arthritis   . Bladder spasm   . Depression   . Headache   . Kidney calculi   . Left ureteral stone   . Leg DVT (deep venous thromboembolism), acute ([place])   . Restless leg syndrome   . Sleep apnea    does not have C-PAP @ present  . Urge incontinence     PAST SURGICAL HISTORY: Past Surgical History:  Procedure Laterality Date  . ABDOMINAL HYSTERECTOMY     TOTAL ABDOMINAL HYSTERECTOMY W/ BILATERAL SALPINGOOPHORECTOMY  . BARIATRIC SURGERY  05/15/2016  . CHOLECYSTECTOMY    . ESOPHAGOGASTRODUODENOSCOPY  05/15/2016  . KIDNEY STONE SURGERY    . KNEE ARTHROSCOPY Right    X 2  . SHOULDER ARTHROSCOPY WITH OPEN ROTATOR CUFF REPAIR Right 11/22/2015   Procedure: SHOULDER ARTHROSCOPY, DEBRIDEMENT, DECOMPRESSION, SLAP REPAIR, POSSIBLE BICEP TENODESIS;  Surgeon: Corky Mull, MD;  Location: [facility] ORS;  Service: Orthopedics;  Laterality: Right;  . SINUS EXPLORATION      FAMILY HISTORY: Family History  Problem Relation Age of Onset  .  Cancer Mother   . Lung cancer Father   . Hematuria Brother   . Kidney disease Paternal Grandfather   . Kidney Stones Brother   . Bladder Cancer Neg Hx     ADVANCED DIRECTIVES (Y/N):  N  HEALTH MAINTENANCE: Social History  Substance Use Topics  . Smoking status: Never Smoker  . Smokeless tobacco: Never Used  . Alcohol  use No     Colonoscopy:  PAP:  Bone density:  Lipid panel:  Allergies  Allergen Reactions  . Alcohol Other (See Comments)    wool  . Bacitracin-Neomycin-Polymyxin Other (See Comments)  . Codeine Nausea Only and Other (See Comments)    "headache"  . Hydrocodone Other (See Comments)  . Hydrocortisone Other (See Comments)  . Lanolin Other (See Comments)    "rash"  . Nickel Other (See Comments)  . Tape Other (See Comments)  . Tramadol Other (See Comments)    "headache"  . Bacitracin Rash  . Clarithromycin Rash  . Latex Rash  . Oxycodone Rash  . Penicillins Rash    Has patient had a PCN reaction causing immediate rash, facial/tongue/throat swelling, SOB or lightheadedness with hypotension: yes Has patient had a PCN reaction causing severe rash involving mucus membranes or skin necrosis: no Has patient had a PCN reaction that required hospitalization No Has patient had a PCN reaction occurring within the last 10 years: No If all of the above answers are "NO", then may proceed with Cephalosporin use.   . Sulfamethoxazole-Trimethoprim Rash    Current Outpatient Prescriptions  Medication Sig Dispense Refill  . enoxaparin (LOVENOX) 120 MG/0.8ML injection Inject 0.8 mLs into the skin every 12 (twelve) hours.    . fluticasone (FLONASE) 50 MCG/ACT nasal spray Place 1 spray into both nostrils every other day.  5  . levocetirizine (XYZAL) 5 MG tablet Take 5 mg by mouth every evening.    . montelukast (SINGULAIR) 10 MG tablet Take 10 mg by mouth at bedtime.    Marland Kitchen NASCOBAL 500 MCG/0.1ML SOLN Place 1 spray into both nostrils once a week. On Wednesdays  2  . Olopatadine HCl (PATADAY) 0.2 % SOLN Place 1 drop into both eyes 2 (two) times daily as needed. For dry eyes    . omeprazole (PRILOSEC) 40 MG capsule Take 40 mg by mouth 2 (two) times daily.  3  . warfarin (COUMADIN) 2.5 MG tablet Take 1 tablet by mouth daily. As directed  11  . warfarin (COUMADIN) 5 MG tablet Take 1 tablet by mouth  daily. As directed  10  . apixaban (ELIQUIS) 5 MG TABS tablet Take 2 tablets (10 mg total) by mouth 2 (two) times daily. 28 tablet 0  . [START ON 07/15/2016] apixaban (ELIQUIS) 5 MG TABS tablet Take 1 tablet (5 mg total) by mouth 2 (two) times daily. 60 tablet 1  . EPINEPHrine (EPIPEN 2-PAK) 0.3 mg/0.3 mL IJ SOAJ injection Inject 0.3 mg into the muscle as needed. For anaphylaxis     No current facility-administered medications for this visit.     OBJECTIVE: Vitals:   07/08/16 0937  BP: 110/73  Pulse: 62  Resp: 20  Temp: 97.8 F (36.6 C)     Body mass index is 36.53 kg/m.    ECOG FS:0 - Asymptomatic  General: Well-developed, well-nourished, no acute distress. Eyes: Pink conjunctiva, anicteric sclera. Lungs: Clear to auscultation bilaterally. Heart: Regular rate and rhythm. No rubs, murmurs, or gallops. Abdomen: Tender, soft, nondistended. No organomegaly noted, normoactive bowel sounds. Musculoskeletal: No edema, cyanosis,  or clubbing. Neuro: Alert, answering all questions appropriately. Cranial nerves grossly intact. Skin: No rashes or petechiae noted. Diffuse bruising on abdomen. Psych: Normal affect.   LAB RESULTS:  Lab Results  Component Value Date   NA 139 07/08/2016   K 4.0 07/08/2016   CL 105 07/08/2016   CO2 27 07/08/2016   GLUCOSE 101 (H) 07/08/2016   BUN 16 07/08/2016   CREATININE 0.48 07/08/2016   CALCIUM 8.9 07/08/2016   PROT 7.0 07/08/2016   ALBUMIN 4.1 07/08/2016   AST 24 07/08/2016   ALT 23 07/08/2016   ALKPHOS 53 07/08/2016   BILITOT 0.4 07/08/2016   GFRNONAA >60 07/08/2016   GFRAA >60 07/08/2016    Lab Results  Component Value Date   WBC 4.6 07/08/2016   NEUTROABS 1.8 07/08/2016   HGB 12.8 07/08/2016   HCT 39.0 07/08/2016   MCV 85.3 07/08/2016   PLT 243 07/08/2016     STUDIES: No results found.  ASSESSMENT: Acute DVT of the popliteal vein of the right lower extremity.  PLAN:    Right leg DVT ([place]) 1. Acute DVT of the popliteal vein  of the right lower extremity: Although unusual, this was likely secondary to transient risk factor of right shoulder surgery. Full hypercoagulable workup was negative except for an elevated DRVVT. Repeat laboratory work after discontinuation of Eliquis on 03/14/16 was normal.  Patient  discharged from clinic in October 2017 with no need for further anticoagulation or follow up.   Patient currently on lovenox and coumadin s/p gastric bypass on 06/16/15, managed by [facility].  INR 1.3 on 07/07/16, continued on coumadin titration to goal of 2-3.  Plan to continue therapy through end of April.  Discontinue lovenox and coumadin starting today, and start taking Eliquis.  Prescription for Eliquis 10mg  BID x7days, then 5mg  BID x60days through end of April sent to pharmacy.  Discussed plan with PCP Ricardo Jericho, NP and she was in agreement with this plan.  Patient expressed understanding and was in agreement with this plan. She also understands that she can call clinic at any time with any questions, concerns, or complaints.    Lucendia Herrlich, NP   07/08/2016 11:49 AM

## 2016-07-09 ENCOUNTER — Telehealth: Payer: Self-pay | Admitting: *Deleted

## 2016-07-09 NOTE — Telephone Encounter (Signed)
States pharmacy did not receive her Eliquis rx. When I checked, it had been sent to [facility] in [place] which was listed as her pharmacy in her chart. She states she has never used [facility] she uses [facility] in [place], but she will get it from [facility]. [facility] to [facility] at her request

## 2016-07-16 NOTE — Progress Notes (Signed)
[place]  [phone] [phone]) [phone]  ID: Sharon [name] OB: 11/01/1960  MR#: [id number]  [id number]  Patient Care Team: Ricardo Jericho, NP as PCP - General (Family Medicine)  CHIEF COMPLAINT: Acute DVT of the popliteal vein of the right lower extremity.  INTERVAL HISTORY: Patient returns to clinic today for further discussion of her anticoagulation. 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 She did not have DVT associated with the surgery, but her surgeon determined she needed 4 months of prophylactic treatment. She had difficulty time maintaining a therapeutic INR and was recently switched to Eliquis. She currently feels well and is asymptomatic.  She is tolerating Eliquis well with out significant side effects. She has no neurologic complaints. She denies any recent fevers or illnesses. She has no chest pain or shortness of breath. She has a good appetite and denies weight loss. She has no nausea, vomiting, constipation, or diarrhea. She has no urinary complaints. Patient offers no specific complaints today.  REVIEW OF SYSTEMS:   Review of Systems  Constitutional: Negative.  Negative for fever, malaise/fatigue and weight loss.  Respiratory: Negative.  Negative for cough and shortness of breath.   Cardiovascular: Negative for chest pain and leg swelling.  Gastrointestinal: Negative.  Negative for abdominal pain.  Genitourinary: Negative.   Musculoskeletal: Negative.  Negative for joint pain.  Neurological: Negative.  Negative for weakness.  Endo/Heme/Allergies: Does not bruise/bleed easily.  Psychiatric/Behavioral: Negative.  The patient is not nervous/anxious.     As per HPI. Otherwise, a complete review of systems is negative.  PAST MEDICAL HISTORY: Past Medical History:  Diagnosis Date  . Acid reflux   . Anxiety   . Arthritis   . Bladder spasm   . Depression   . Headache   .  Kidney calculi   . Left ureteral stone   . Leg DVT (deep venous thromboembolism), acute ([place])   . Restless leg syndrome   . Sleep apnea    does not have C-PAP @ present  . Urge incontinence     PAST SURGICAL HISTORY: Past Surgical History:  Procedure Laterality Date  . ABDOMINAL HYSTERECTOMY     TOTAL ABDOMINAL HYSTERECTOMY W/ BILATERAL SALPINGOOPHORECTOMY  . BARIATRIC SURGERY  05/15/2016  . CHOLECYSTECTOMY    . ESOPHAGOGASTRODUODENOSCOPY  05/15/2016  . KIDNEY STONE SURGERY    . KNEE ARTHROSCOPY Right    X 2  . SHOULDER ARTHROSCOPY WITH OPEN ROTATOR CUFF REPAIR Right 11/22/2015   Procedure: SHOULDER ARTHROSCOPY, DEBRIDEMENT, DECOMPRESSION, SLAP REPAIR, POSSIBLE BICEP TENODESIS;  Surgeon: Corky Mull, MD;  Location: [facility] ORS;  Service: Orthopedics;  Laterality: Right;  . SINUS EXPLORATION      FAMILY HISTORY: Family History  Problem Relation Age of Onset  . Cancer Mother   . Lung cancer Father   . Hematuria Brother   . Kidney disease Paternal Grandfather   . Kidney Stones Brother   . Bladder Cancer Neg Hx     ADVANCED DIRECTIVES (Y/N):  N  HEALTH MAINTENANCE: Social History  Substance Use Topics  . Smoking status: Never Smoker  . Smokeless tobacco: Never Used  . Alcohol use No     Colonoscopy:  PAP:  Bone density:  Lipid panel:  Allergies  Allergen Reactions  . Alcohol Other (See Comments)    wool  . Bacitracin-Neomycin-Polymyxin Other (See Comments)  . Codeine Nausea Only and Other (See Comments)    "headache"  . Hydrocodone Other (See Comments)  .  Hydrocortisone Other (See Comments)  . Lanolin Other (See Comments)    "rash"  . Nickel Other (See Comments)  . Other Other (See Comments)    Bitartrate/black rubber dye/elastic  . Tape Other (See Comments)  . Tramadol Other (See Comments)    "headache"  . Bacitracin Rash  . Clarithromycin Rash  . Latex Rash  . Oxycodone Rash  . Penicillins Rash    Has patient had a PCN reaction causing immediate  rash, facial/tongue/throat swelling, SOB or lightheadedness with hypotension: yes Has patient had a PCN reaction causing severe rash involving mucus membranes or skin necrosis: no Has patient had a PCN reaction that required hospitalization No Has patient had a PCN reaction occurring within the last 10 years: No If all of the above answers are "NO", then may proceed with Cephalosporin use.   . Sulfamethoxazole-Trimethoprim Rash    Current Outpatient Prescriptions  Medication Sig Dispense Refill  . apixaban (ELIQUIS) 5 MG TABS tablet Take 1 tablet (5 mg total) by mouth 2 (two) times daily. (Patient taking differently: Take 10 mg by mouth daily. ) 60 tablet 1  . EPINEPHrine (EPIPEN 2-PAK) 0.3 mg/0.3 mL IJ SOAJ injection Inject 0.3 mg into the muscle as needed. For anaphylaxis    . fluticasone (FLONASE) 50 MCG/ACT nasal spray Place 1 spray into both nostrils every other day.  5  . levocetirizine (XYZAL) 5 MG tablet Take 5 mg by mouth every evening.    . montelukast (SINGULAIR) 10 MG tablet Take 10 mg by mouth at bedtime.    Marland Kitchen NASCOBAL 500 MCG/0.1ML SOLN Place 1 spray into both nostrils once a week. On Wednesdays  2  . Olopatadine HCl (PATADAY) 0.2 % SOLN Place 1 drop into both eyes 2 (two) times daily as needed. For dry eyes    . omeprazole (PRILOSEC) 40 MG capsule Take 40 mg by mouth 2 (two) times daily.  3   No current facility-administered medications for this visit.     OBJECTIVE: Vitals:   07/18/16 1154  BP: 117/76  Pulse: 65  Resp: 18  Temp: 97.2 F (36.2 C)     Body mass index is 36.72 kg/m.    ECOG FS:0 - Asymptomatic  General: Well-developed, well-nourished, no acute distress. Eyes: Pink conjunctiva, anicteric sclera. Lungs: Clear to auscultation bilaterally. Heart: Regular rate and rhythm. No rubs, murmurs, or gallops. Abdomen: Soft, nontender, nondistended. No organomegaly noted, normoactive bowel sounds. Musculoskeletal: No edema, cyanosis, or clubbing. Neuro: Alert,  answering all questions appropriately. Cranial nerves grossly intact. Skin: No rashes or petechiae noted. Psych: Normal affect.   LAB RESULTS:  Lab Results  Component Value Date   NA 139 07/08/2016   K 4.0 07/08/2016   CL 105 07/08/2016   CO2 27 07/08/2016   GLUCOSE 101 (H) 07/08/2016   BUN 16 07/08/2016   CREATININE 0.48 07/08/2016   CALCIUM 8.9 07/08/2016   PROT 7.0 07/08/2016   ALBUMIN 4.1 07/08/2016   AST 24 07/08/2016   ALT 23 07/08/2016   ALKPHOS 53 07/08/2016   BILITOT 0.4 07/08/2016   GFRNONAA >60 07/08/2016   GFRAA >60 07/08/2016    Lab Results  Component Value Date   WBC 4.6 07/08/2016   NEUTROABS 1.8 07/08/2016   HGB 12.8 07/08/2016   HCT 39.0 07/08/2016   MCV 85.3 07/08/2016   PLT 243 07/08/2016     STUDIES: No results found.  ASSESSMENT: Acute DVT of the popliteal vein of the right lower extremity.  PLAN:    1. Acute  DVT of the popliteal vein of the right lower extremity: Although unusual, this was likely secondary to transient risk factor of right shoulder surgery. Full hypercoagulable workup was negative except for an elevated DRVVT. Repeat laboratory work after discontinuation of Eliquis was normal.   2. Anticoagulation: Unclear why patient required 4 months of treatment doses of anticoagulation after gastric bypass surgery, but have recommended she continue treatment until she is evaluated in April as per her surgeons protocol. No further intervention is needed. No follow-up is necessary.  Approximately 30 minutes was spent in discussion of which greater than 50% was consultation.  Patient expressed understanding and was in agreement with this plan. She also understands that She can call clinic at any time with any questions, concerns, or complaints.    Lloyd Huger, MD   07/18/2016 1:08 PM

## 2016-07-18 ENCOUNTER — Other Ambulatory Visit: Payer: BLUE CROSS/BLUE SHIELD

## 2016-07-18 ENCOUNTER — Inpatient Hospital Stay: Payer: BLUE CROSS/BLUE SHIELD | Attending: Oncology | Admitting: Oncology

## 2016-07-18 VITALS — BP 117/76 | HR 65 | Temp 97.2°F | Resp 18 | Wt 227.5 lb

## 2016-07-18 DIAGNOSIS — Z801 Family history of malignant neoplasm of trachea, bronchus and lung: Secondary | ICD-10-CM | POA: Insufficient documentation

## 2016-07-18 DIAGNOSIS — K219 Gastro-esophageal reflux disease without esophagitis: Secondary | ICD-10-CM | POA: Diagnosis not present

## 2016-07-18 DIAGNOSIS — Z882 Allergy status to sulfonamides status: Secondary | ICD-10-CM | POA: Diagnosis not present

## 2016-07-18 DIAGNOSIS — G4730 Sleep apnea, unspecified: Secondary | ICD-10-CM | POA: Diagnosis not present

## 2016-07-18 DIAGNOSIS — G2581 Restless legs syndrome: Secondary | ICD-10-CM

## 2016-07-18 DIAGNOSIS — Z9884 Bariatric surgery status: Secondary | ICD-10-CM | POA: Diagnosis not present

## 2016-07-18 DIAGNOSIS — Z880 Allergy status to penicillin: Secondary | ICD-10-CM | POA: Diagnosis not present

## 2016-07-18 DIAGNOSIS — I82431 Acute embolism and thrombosis of right popliteal vein: Secondary | ICD-10-CM | POA: Diagnosis not present

## 2016-07-18 DIAGNOSIS — Z79899 Other long term (current) drug therapy: Secondary | ICD-10-CM | POA: Diagnosis not present

## 2016-07-18 DIAGNOSIS — Z7901 Long term (current) use of anticoagulants: Secondary | ICD-10-CM

## 2016-07-18 NOTE — Progress Notes (Signed)
States is feeling well today. Complains of easy bruising. Here for follow up regarding anti-coagulation post surgery.

## 2016-09-26 IMAGING — CR DG CHEST 1V
1 series · 1 of 1 positions shown · non-contrast
Comparison: Report from 01/16/2012

CLINICAL DATA: Preoperative for renal calculus.

EXAM:
CHEST - 1 VIEW

[ap]
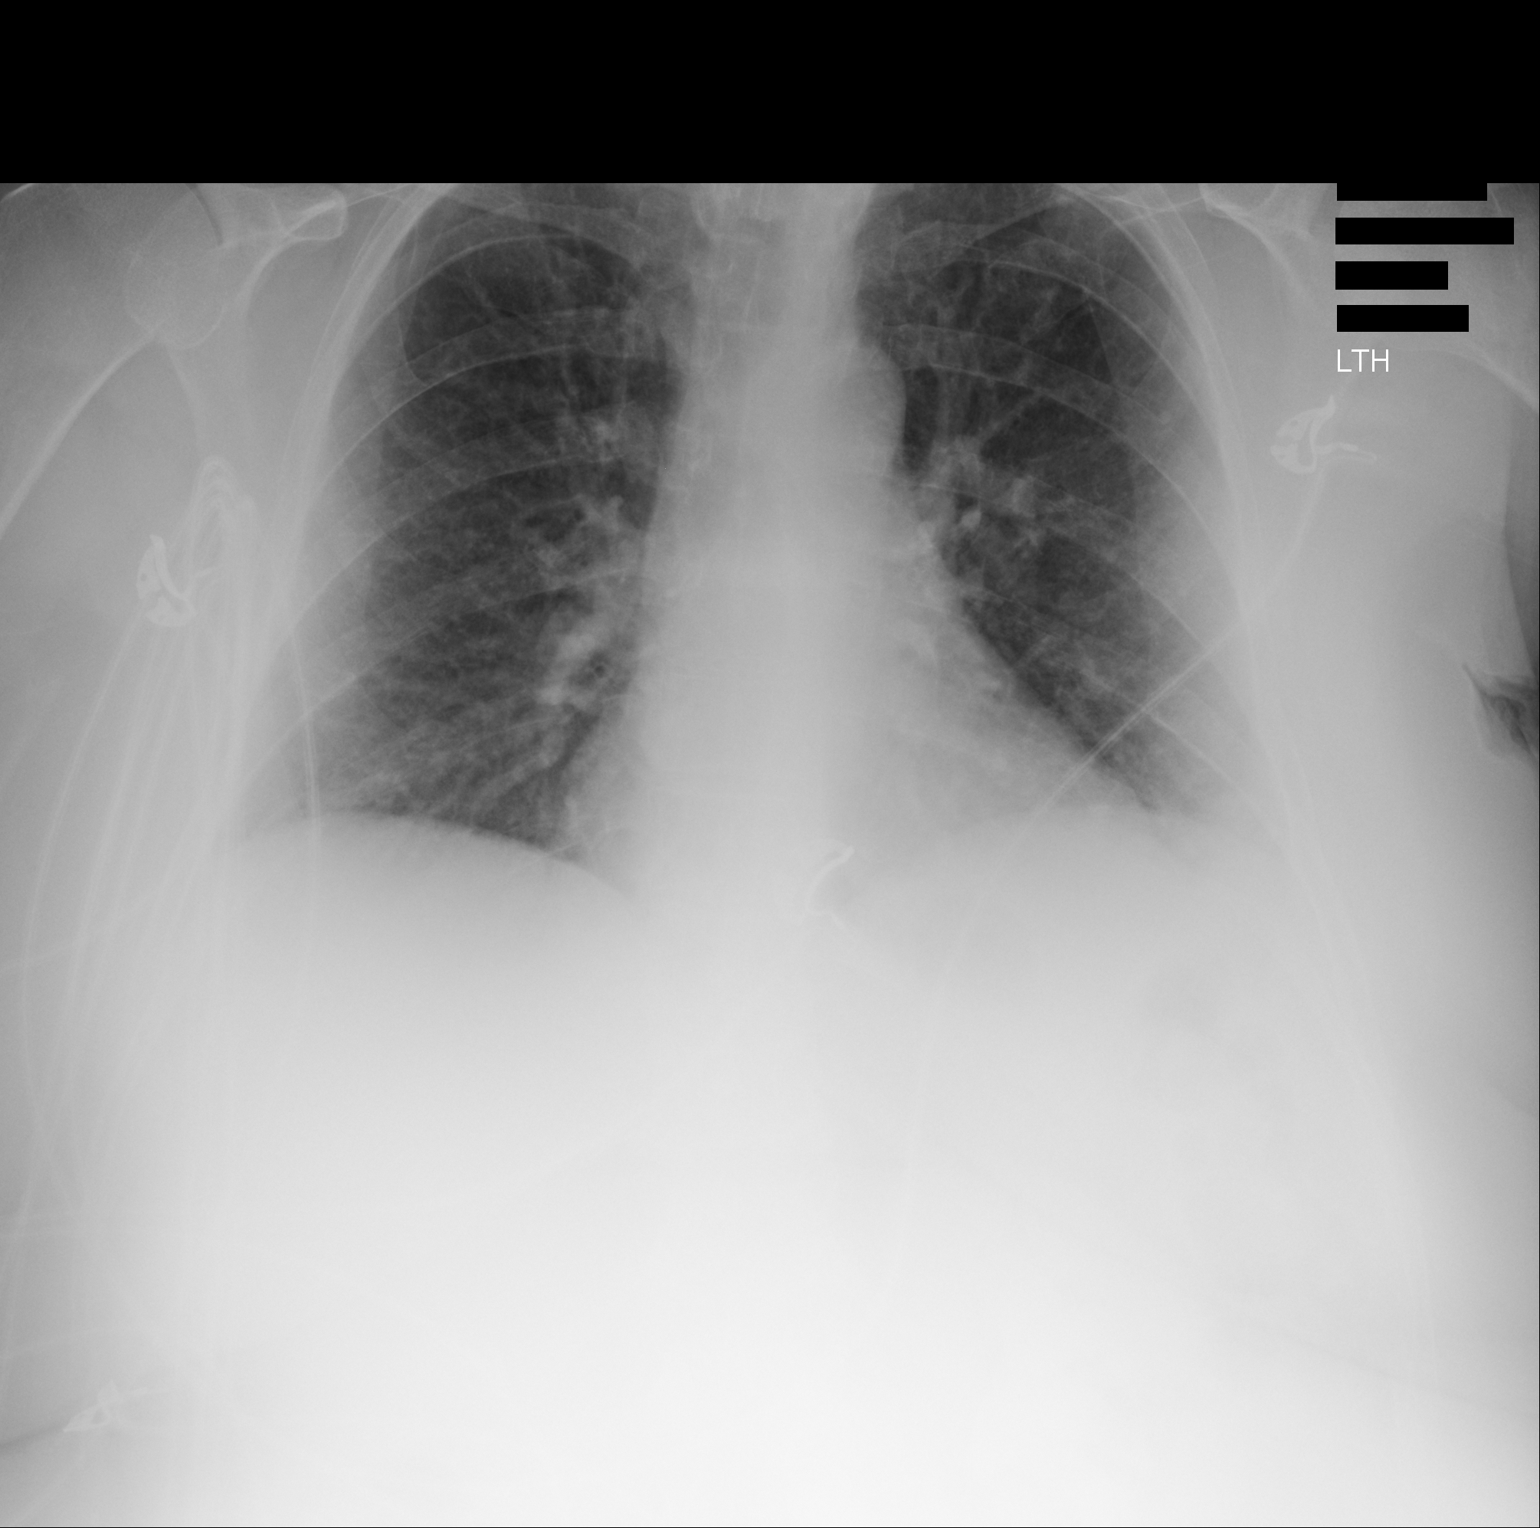

[1 of 1 positions shown; findings below may reference images not displayed]

FINDINGS: Low lung volumes are present, causing crowding of the pulmonary
vasculature. Given the portable AP technique, cardiac and
mediastinal contours appear normal. Body habitus reduces diagnostic
sensitivity and specificity. Lungs appear clear.
IMPRESSION: 1. No significant abnormality observed.
2. Low lung volumes.

## 2016-09-26 IMAGING — CT CT ABD-PEL WO/W CM
3 of 8 series · 14 of 36 positions shown, 19 images · IV contrast (WATER & [ID] OMNI 300)
Comparison: None.

CLINICAL DATA: Initial encounter for left lower quadrant pain
radiating into left flank. Cholecystectomy and hysterectomy.
Constipation. Nausea. Evaluate for kidney stones versus
diverticulitis.

EXAM:
CT ABDOMEN AND PELVIS WITHOUT AND WITH CONTRAST
TECHNIQUE: Multidetector CT imaging of the abdomen and pelvis was performed
following the standard protocol before and following the bolus
administration of intravenous contrast.
CONTRAST:  125mL OMNIPAQUE IOHEXOL 300 MG/ML  SOLN

[Series 5: abd/pelvis with · axial · 0.98mm/px · z∈[-336,-36]mm · 4 of 102 slices shown, 9 images]
[im 21/102  soft-tissue]
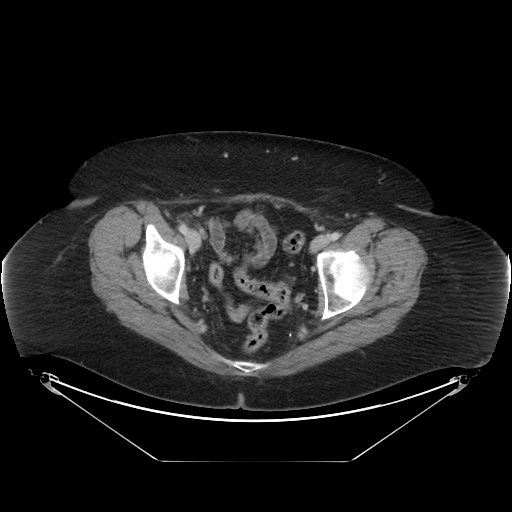
[im 21/102  lung]
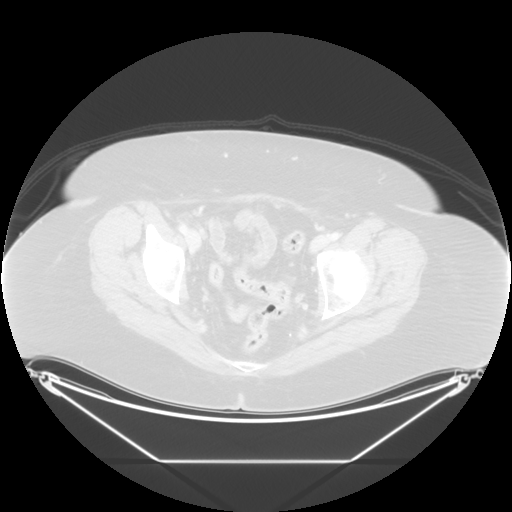
[im 21/102  bone]
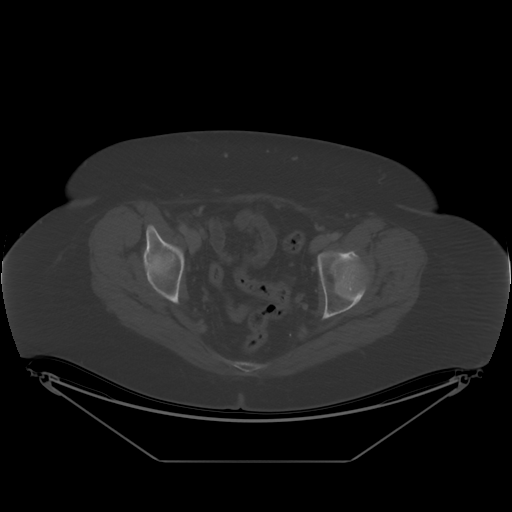
[im 41/102  soft-tissue]
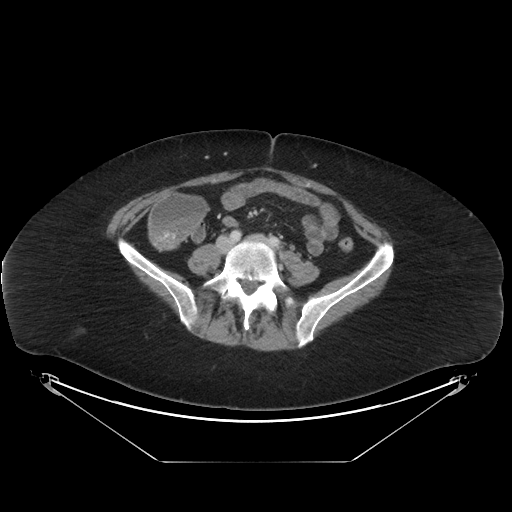
[im 41/102  lung]
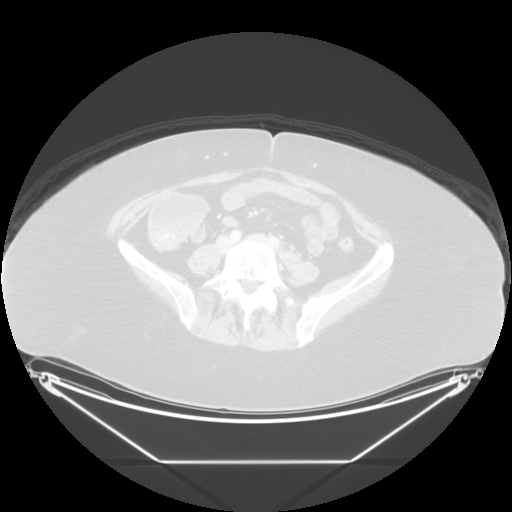
[im 61/102  soft-tissue]
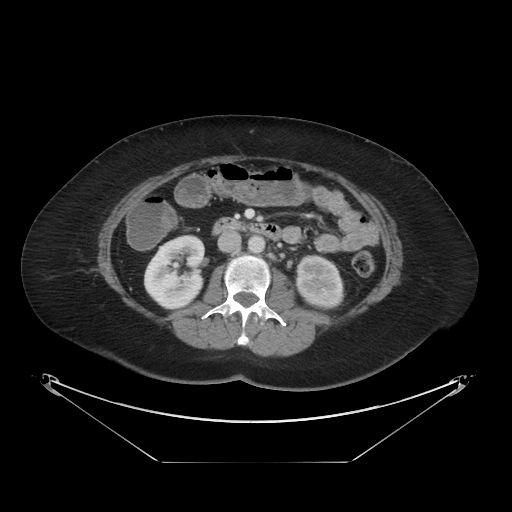
[im 61/102  lung]
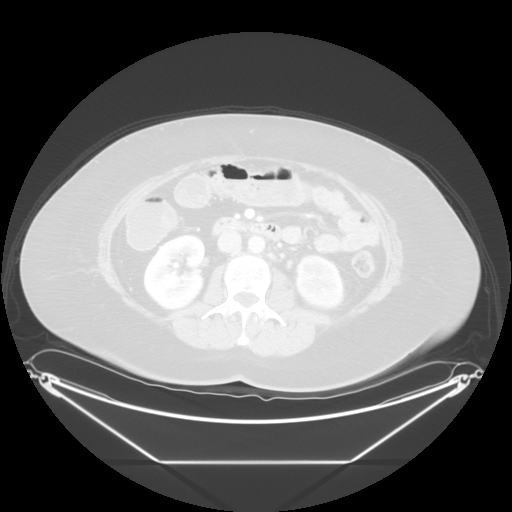
[im 81/102  soft-tissue]
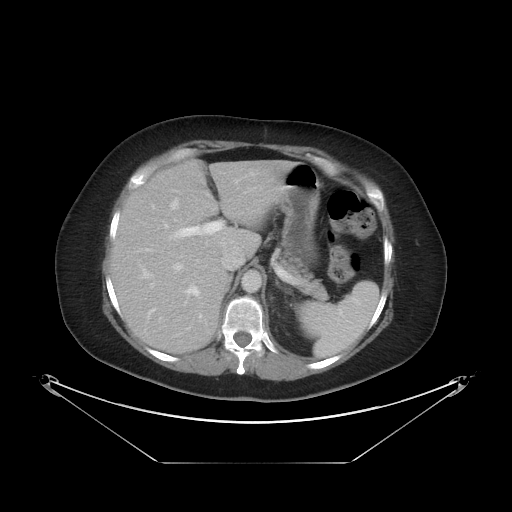
[im 81/102  lung]
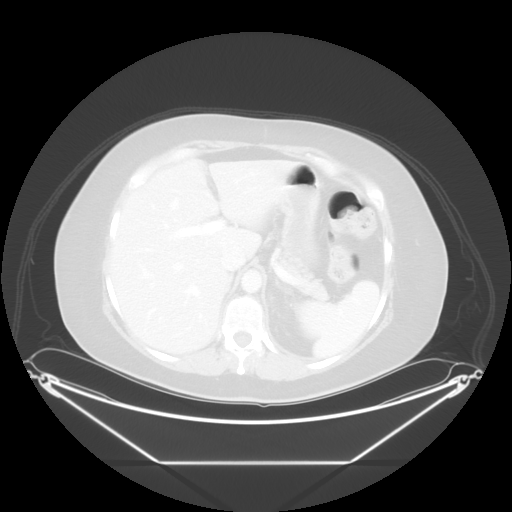

[Series 602: sagittal body · sagittal · 0.80mm/px · 2 of 165 slices shown (1 of 2)]
[im 21/165  soft-tissue]
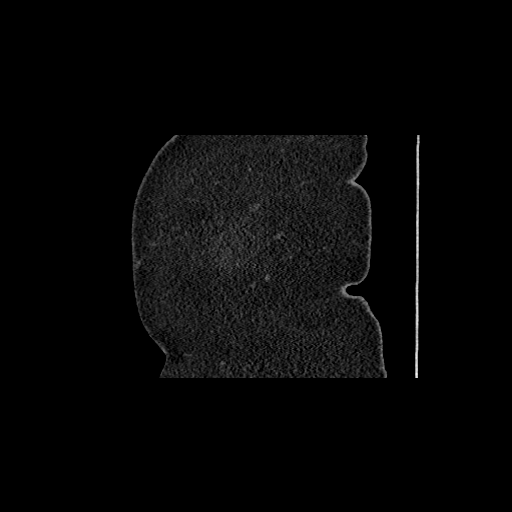
[im 42/165  soft-tissue]
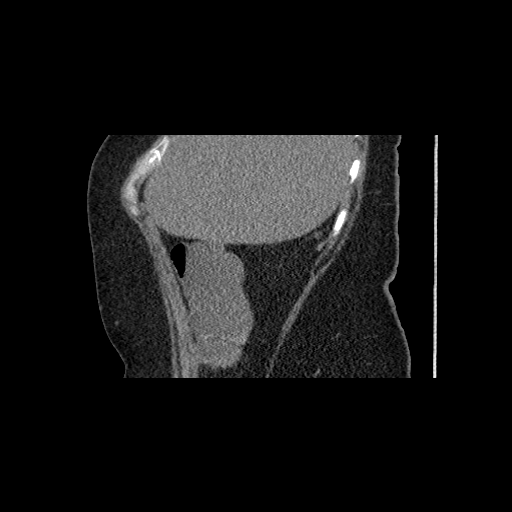

[Series 605: sagittal body · sagittal · 0.99mm/px · 8 of 200 slices shown (2 of 2)]
[im 20/200  soft-tissue]
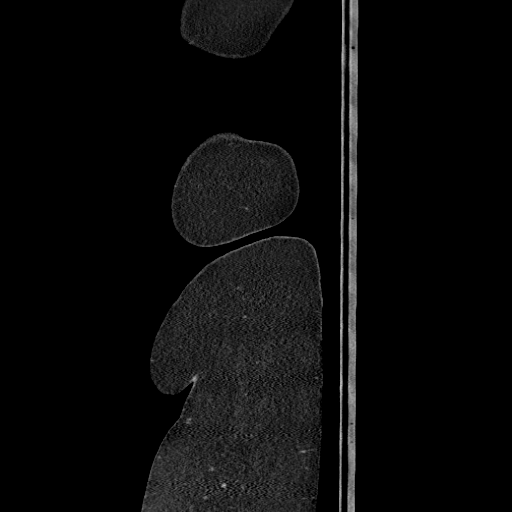
[im 40/200  soft-tissue]
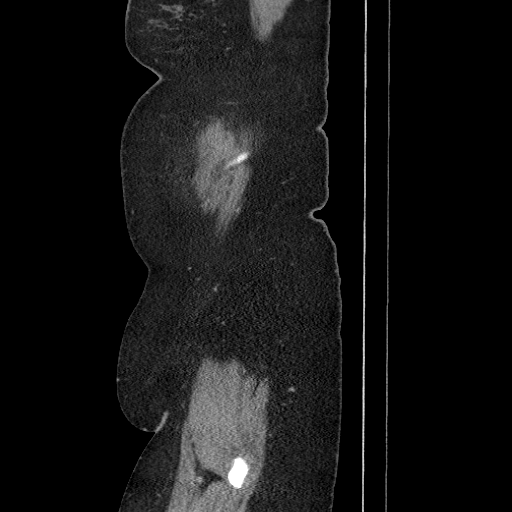
[im 60/200  soft-tissue]
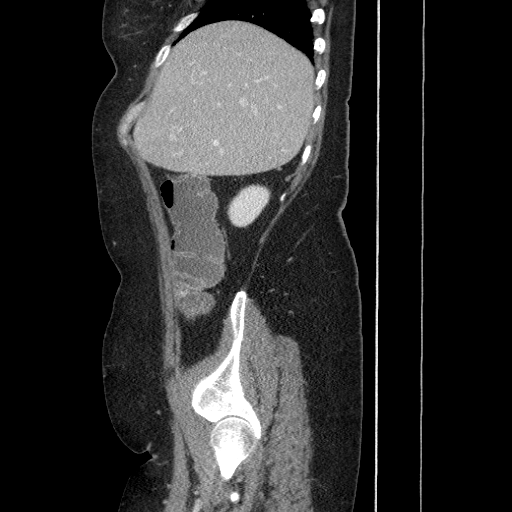
[im 80/200  soft-tissue]
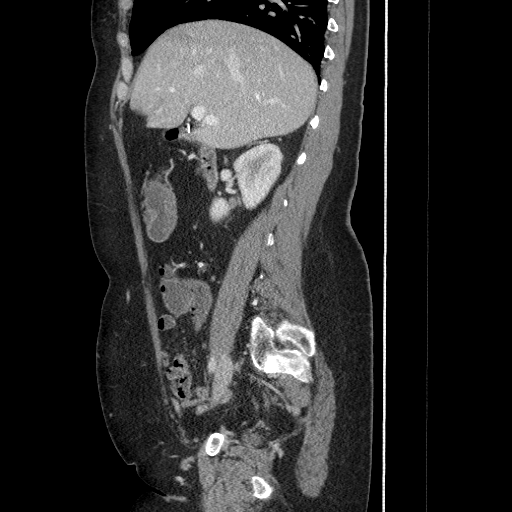
[im 120/200  soft-tissue]
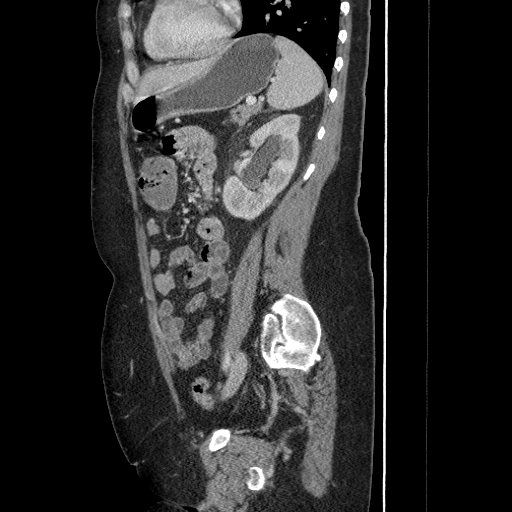
[im 140/200  soft-tissue]
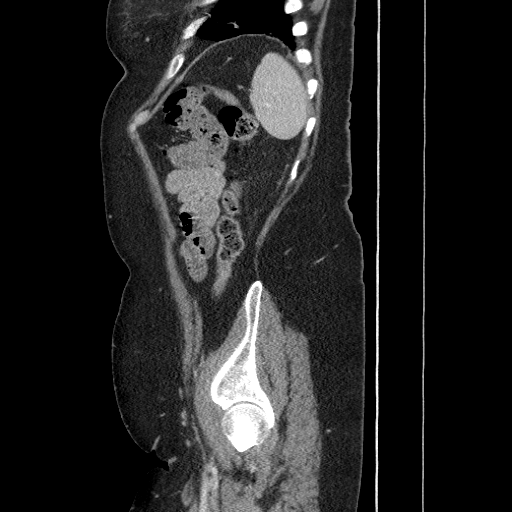
[im 160/200  soft-tissue]
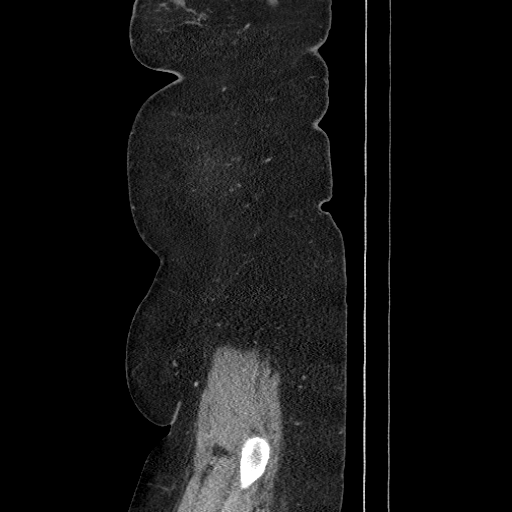
[im 180/200  soft-tissue]
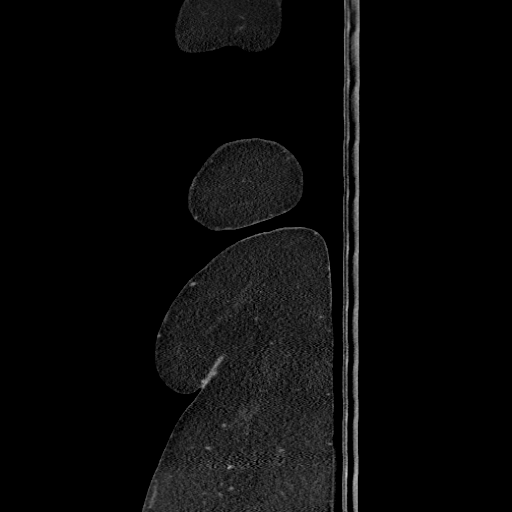

[14 of 36 positions shown; findings below may reference images not displayed]

FINDINGS: Lower chest: Lingular subsegmental atelectasis or scar. Normal heart
size without pericardial or pleural effusion. Tiny hiatal hernia.

Hepatobiliary: Normal liver. Cholecystectomy, without biliary ductal
dilatation.

Pancreas: Normal, without mass or ductal dilatation.

Spleen: Normal

Adrenals/Urinary Tract: Normal adrenal glands. Multiple small left
renal collecting system calculi. Moderate obstruction and decreased
renal function secondary to a left ureteropelvic junction stone.
This measures 12 mm. The distal ureters are normal in caliber.
Normal urinary bladder.

Stomach/Bowel: Normal distal stomach. Normal colon, appendix, and
terminal ileum. Normal small bowel.

Vascular/Lymphatic: Normal caliber of the aorta and branch vessels.
No abdominopelvic adenopathy.

Reproductive: Hysterectomy.  No adnexal mass.

Other: No significant free fluid.

Musculoskeletal: No acute osseous abnormality.
IMPRESSION: 1. Moderate obstruction secondary to a 12 mm left ureterovesicular
junction calculus.
2. Left nephrolithiasis.
3. No evidence of diverticulitis.
4. Small hiatal hernia.

## 2016-11-14 ENCOUNTER — Emergency Department: Payer: BLUE CROSS/BLUE SHIELD

## 2016-11-14 ENCOUNTER — Inpatient Hospital Stay
Admission: EM | Admit: 2016-11-14 | Discharge: 2016-11-17 | DRG: 872 | Disposition: A | Discharge status: Home / Self Care | Payer: BLUE CROSS/BLUE SHIELD | Attending: Internal Medicine | Admitting: Internal Medicine

## 2016-11-14 ENCOUNTER — Other Ambulatory Visit: Payer: Self-pay

## 2016-11-14 ENCOUNTER — Encounter: Payer: Self-pay | Admitting: Emergency Medicine

## 2016-11-14 DIAGNOSIS — Z880 Allergy status to penicillin: Secondary | ICD-10-CM

## 2016-11-14 DIAGNOSIS — Z9884 Bariatric surgery status: Secondary | ICD-10-CM

## 2016-11-14 DIAGNOSIS — Z91040 Latex allergy status: Secondary | ICD-10-CM

## 2016-11-14 DIAGNOSIS — Z87442 Personal history of urinary calculi: Secondary | ICD-10-CM

## 2016-11-14 DIAGNOSIS — Z885 Allergy status to narcotic agent status: Secondary | ICD-10-CM

## 2016-11-14 DIAGNOSIS — Z86718 Personal history of other venous thrombosis and embolism: Secondary | ICD-10-CM

## 2016-11-14 DIAGNOSIS — A419 Sepsis, unspecified organism: Secondary | ICD-10-CM | POA: Diagnosis not present

## 2016-11-14 DIAGNOSIS — K219 Gastro-esophageal reflux disease without esophagitis: Secondary | ICD-10-CM | POA: Diagnosis present

## 2016-11-14 DIAGNOSIS — R109 Unspecified abdominal pain: Secondary | ICD-10-CM | POA: Diagnosis not present

## 2016-11-14 DIAGNOSIS — N390 Urinary tract infection, site not specified: Secondary | ICD-10-CM | POA: Diagnosis present

## 2016-11-14 DIAGNOSIS — N1330 Unspecified hydronephrosis: Secondary | ICD-10-CM | POA: Diagnosis present

## 2016-11-14 DIAGNOSIS — Z801 Family history of malignant neoplasm of trachea, bronchus and lung: Secondary | ICD-10-CM

## 2016-11-14 DIAGNOSIS — N136 Pyonephrosis: Secondary | ICD-10-CM | POA: Diagnosis present

## 2016-11-14 DIAGNOSIS — Z79899 Other long term (current) drug therapy: Secondary | ICD-10-CM

## 2016-11-14 DIAGNOSIS — Z888 Allergy status to other drugs, medicaments and biological substances status: Secondary | ICD-10-CM

## 2016-11-14 LAB — URINALYSIS, COMPLETE (UACMP) WITH MICROSCOPIC
Bilirubin Urine: NEGATIVE
Glucose, UA: NEGATIVE mg/dL
Hgb urine dipstick: NEGATIVE
Ketones, ur: 5 mg/dL — AB
Nitrite: NEGATIVE
Protein, ur: NEGATIVE mg/dL
RBC / HPF: NONE SEEN RBC/hpf (ref 0–5)
Specific Gravity, Urine: 1.003 — ABNORMAL LOW (ref 1.005–1.030)
pH: 7 (ref 5.0–8.0)

## 2016-11-14 LAB — CBC WITH DIFFERENTIAL/PLATELET
Basophils Absolute: 0 10*3/uL (ref 0–0.1)
Basophils Relative: 0 %
Eosinophils Absolute: 0 10*3/uL (ref 0–0.7)
Eosinophils Relative: 0 %
HCT: 38.6 % (ref 35.0–47.0)
Hemoglobin: 13 g/dL (ref 12.0–16.0)
Lymphocytes Relative: 16 %
Lymphs Abs: 2 10*3/uL (ref 1.0–3.6)
MCH: 27.7 pg (ref 26.0–34.0)
MCHC: 33.8 g/dL (ref 32.0–36.0)
MCV: 82 fL (ref 80.0–100.0)
Monocytes Absolute: 1.4 10*3/uL — ABNORMAL HIGH (ref 0.2–0.9)
Monocytes Relative: 11 %
Neutro Abs: 9.2 10*3/uL — ABNORMAL HIGH (ref 1.4–6.5)
Neutrophils Relative %: 73 %
Platelets: 216 10*3/uL (ref 150–440)
RBC: 4.7 MIL/uL (ref 3.80–5.20)
RDW: 15.8 % — ABNORMAL HIGH (ref 11.5–14.5)
WBC: 12.6 10*3/uL — ABNORMAL HIGH (ref 3.6–11.0)

## 2016-11-14 LAB — COMPREHENSIVE METABOLIC PANEL
ALT: 16 U/L (ref 14–54)
AST: 22 U/L (ref 15–41)
Albumin: 3.9 g/dL (ref 3.5–5.0)
Alkaline Phosphatase: 73 U/L (ref 38–126)
Anion gap: 9 (ref 5–15)
BUN: 19 mg/dL (ref 6–20)
CO2: 24 mmol/L (ref 22–32)
Calcium: 9.1 mg/dL (ref 8.9–10.3)
Chloride: 103 mmol/L (ref 101–111)
Creatinine, Ser: 0.69 mg/dL (ref 0.44–1.00)
GFR calc Af Amer: >60 mL/min (ref >60)
GFR calc non Af Amer: >60 mL/min (ref >60)
Glucose, Bld: 121 mg/dL — ABNORMAL HIGH (ref 65–99)
Potassium: 3.7 mmol/L (ref 3.5–5.1)
Sodium: 136 mmol/L (ref 135–145)
Total Bilirubin: 1.5 mg/dL — ABNORMAL HIGH (ref 0.3–1.2)
Total Protein: 7.2 g/dL (ref 6.5–8.1)

## 2016-11-14 LAB — LACTIC ACID, PLASMA: Lactic Acid, Venous: 0.7 mmol/L (ref 0.5–1.9)

## 2016-11-14 LAB — PROTIME-INR
INR: 1.11
Prothrombin Time: 14.4 seconds (ref 11.4–15.2)

## 2016-11-14 MED ORDER — DEXTROSE 5 % IV SOLN
2.0000 g | INTRAVENOUS | Status: DC
  Administered 2016-11-15 – 2016-11-16 (×2): 2 g via INTRAVENOUS
  Filled 2016-11-14 (×3): qty 2

## 2016-11-14 MED ORDER — DEXTROSE 5 % IV SOLN
2.0000 g | Freq: Once | INTRAVENOUS | Status: AC
  Administered 2016-11-14: 2 g via INTRAVENOUS
  Filled 2016-11-14: qty 2

## 2016-11-14 MED ORDER — SODIUM CHLORIDE 0.9 % IV BOLUS (SEPSIS)
2000.0000 mL | Freq: Once | INTRAVENOUS | Status: AC
  Administered 2016-11-14: 2000 mL via INTRAVENOUS

## 2016-11-14 MED ORDER — SODIUM CHLORIDE 0.9 % IV SOLN
1000.0000 mL | INTRAVENOUS | Status: DC
  Administered 2016-11-14 – 2016-11-15 (×2): 1000 mL via INTRAVENOUS

## 2016-11-14 NOTE — ED Triage Notes (Signed)
Pt states that she has had left flank pain since Wednesday. Pt has hx of kidney stone blockage and associated sepsis. Pt states that she had a fever at home of 102.8 and took Tylenol around 2100. Pt is tachycardic and febrile in triage at this time.

## 2016-11-14 NOTE — ED Notes (Signed)
Pt ambulatory to desk in NAD, report fever 102 at home, took tylenol at 2100, report left flank pain, hx of sepsis associated with kidney stones in 2016.

## 2016-11-14 NOTE — ED Notes (Signed)
Patient transported to X-ray 

## 2016-11-14 NOTE — ED Provider Notes (Signed)
[facility] Emergency Department Provider Note    First MD Initiated Contact with Patient 11/14/16 2145     (approximate)  I have reviewed the triage vital signs and the nursing notes.   HISTORY  Chief Complaint Flank Pain and Fever    HPI Sharon [name] is a 56 y.o. female presents with 3 days of worsening malaise associated with dysuria, increased urinary frequency and acute left flank pain radiating to her left groin. Patient presenting from home. States that she did feel febrile today and checked a temperature and is 102.8. She took Tylenol prior to arrival to the ER. Does feel generalized weakness. Patient does have a history of obstructing ureterolithiasis with superimposed imposed infection causing sepsis requiring admission to hospital. Review of medical records shows that it was a pansensitive Escherichia coli UTI. Patient is also status post gastric bypass 6 months ago. She denies any epigastric pain. Does feel nauseated but no vomiting. No shortness of breath or chest pain.   Past Medical History:  Diagnosis Date  . Acid reflux   . Anxiety   . Arthritis   . Bladder spasm   . Depression   . Headache   . Kidney calculi   . Left ureteral stone   . Leg DVT (deep venous thromboembolism), acute ([place])   . Restless leg syndrome   . Sleep apnea    does not have C-PAP @ present  . Urge incontinence    Family History  Problem Relation Age of Onset  . Cancer Mother   . Lung cancer Father   . Hematuria Brother   . Kidney disease Paternal Grandfather   . Kidney Stones Brother   . Bladder Cancer Neg Hx    Past Surgical History:  Procedure Laterality Date  . ABDOMINAL HYSTERECTOMY     TOTAL ABDOMINAL HYSTERECTOMY W/ BILATERAL SALPINGOOPHORECTOMY  . BARIATRIC SURGERY  05/15/2016  . CHOLECYSTECTOMY    . ESOPHAGOGASTRODUODENOSCOPY  05/15/2016  . KIDNEY STONE SURGERY    . KNEE ARTHROSCOPY Right    X 2  . SHOULDER ARTHROSCOPY WITH OPEN  ROTATOR CUFF REPAIR Right 11/22/2015   Procedure: SHOULDER ARTHROSCOPY, DEBRIDEMENT, DECOMPRESSION, SLAP REPAIR, POSSIBLE BICEP TENODESIS;  Surgeon: Corky Mull, MD;  Location: [facility] ORS;  Service: Orthopedics;  Laterality: Right;  . SINUS EXPLORATION     Patient Active Problem List   Diagnosis Date Noted  . Right leg DVT ([place]) 01/24/2016  . Kidney stones 09/10/2015  . Urge incontinence 09/10/2015  . Nocturia 09/10/2015      Prior to Admission medications   Medication Sig Start Date End Date Taking? Authorizing Provider  apixaban (ELIQUIS) 5 MG TABS tablet Take 1 tablet (5 mg total) by mouth 2 (two) times daily. Patient taking differently: Take 10 mg by mouth daily.  07/15/16   Lucendia Herrlich, NP  EPINEPHrine (EPIPEN 2-PAK) 0.3 mg/0.3 mL IJ SOAJ injection Inject 0.3 mg into the muscle as needed. For anaphylaxis    [provider]  fluticasone (FLONASE) 50 MCG/ACT nasal spray Place 1 spray into both nostrils every other day. 07/05/16   [provider]  levocetirizine (XYZAL) 5 MG tablet Take 5 mg by mouth every evening.    [provider]  montelukast (SINGULAIR) 10 MG tablet Take 10 mg by mouth at bedtime.    [provider]  NASCOBAL 500 MCG/0.1ML SOLN Place 1 spray into both nostrils once a week. On Wednesdays 07/07/16   [provider]  Olopatadine HCl (PATADAY) 0.2 %  SOLN Place 1 drop into both eyes 2 (two) times daily as needed. For dry eyes    [provider]  omeprazole (PRILOSEC) 40 MG capsule Take 40 mg by mouth 2 (two) times daily. 10/22/15   [provider]    Allergies Alcohol; Bacitracin-neomycin-polymyxin; Codeine; Hydrocodone; Hydrocortisone; Lanolin; Nickel; Other; Tape; Tramadol; Bacitracin; Clarithromycin; Latex; Oxycodone; Penicillins; and Sulfamethoxazole-trimethoprim    Social History Social History  Substance Use Topics  . Smoking status: Never Smoker  . Smokeless tobacco: Never Used  . Alcohol use  No    Review of Systems Patient denies headaches, rhinorrhea, blurry vision, numbness, shortness of breath, chest pain, edema, cough, abdominal pain, nausea, vomiting, diarrhea, dysuria, fevers, rashes or hallucinations unless otherwise stated above in HPI. ____________________________________________   PHYSICAL EXAM:  VITAL SIGNS: Vitals:   11/14/16 2138  BP: 121/72  Pulse: (!) 124  Resp: (!) 22  Temp: 99.4 F (37.4 C)    Constitutional: Alert and oriented. Ill appearing but in no acute distress. Eyes: Conjunctivae are normal.  Head: Atraumatic. Nose: No congestion/rhinnorhea. Mouth/Throat: Mucous membranes are moist.   Neck: No stridor. Painless ROM.  Cardiovascular: Normal rate, regular rhythm. Grossly normal heart sounds.  Good peripheral circulation. Respiratory: Normal respiratory effort.  No retractions. Lungs CTAB. Gastrointestinal: Soft and nontender. No distention. No abdominal bruits. + left CVA tenderness. Musculoskeletal: No lower extremity tenderness nor edema.  No joint effusions. Neurologic:  Normal speech and language. No gross focal neurologic deficits are appreciated. No facial droop Skin:  Skin is warm, dry and intact. No rash noted. Psychiatric: Mood and affect are normal. Speech and behavior are normal.  ____________________________________________   LABS (all labs ordered are listed, but only abnormal results are displayed)  Results for orders placed or performed during the hospital encounter of 11/14/16 (from the past 24 hour(s))  CBC with Differential     Status: Abnormal   Collection Time: 11/14/16  9:49 PM  Result Value Ref Range   WBC 12.6 (H) 3.6 - 11.0 K/uL   RBC 4.70 3.80 - 5.20 MIL/uL   Hemoglobin 13.0 12.0 - 16.0 g/dL   HCT 38.6 35.0 - 47.0 %   MCV 82.0 80.0 - 100.0 fL   MCH 27.7 26.0 - 34.0 pg   MCHC 33.8 32.0 - 36.0 g/dL   RDW 15.8 (H) 11.5 - 14.5 %   Platelets 216 150 - 440 K/uL   Neutrophils Relative % 73 %   Neutro Abs 9.2 (H)  1.4 - 6.5 K/uL   Lymphocytes Relative 16 %   Lymphs Abs 2.0 1.0 - 3.6 K/uL   Monocytes Relative 11 %   Monocytes Absolute 1.4 (H) 0.2 - 0.9 K/uL   Eosinophils Relative 0 %   Eosinophils Absolute 0.0 0 - 0.7 K/uL   Basophils Relative 0 %   Basophils Absolute 0.0 0 - 0.1 K/uL  Protime-INR     Status: None   Collection Time: 11/14/16  9:49 PM  Result Value Ref Range   Prothrombin Time 14.4 11.4 - 15.2 seconds   INR 1.11    ____________________________________________  EKG My review and personal interpretation at Time: 21:50   Indication: sepsis  Rate: 115  Rhythm: sinus Axis: normal Other: non specific st changes likely rate dependent, no STEMI ____________________________________________  RADIOLOGY  I personally reviewed all radiographic images ordered to evaluate for the above acute complaints and reviewed radiology reports and findings.  These findings were personally discussed with the patient.  Please see medical record for radiology  report.  ____________________________________________   PROCEDURES  Procedure(s) performed:  Procedures    Critical Care performed: yes CRITICAL CARE Performed by: Merlyn Lot   Total critical care time: 35 minutes  Critical care time was exclusive of separately billable procedures and treating other patients.  Critical care was necessary to treat or prevent imminent or life-threatening deterioration.  Critical care was time spent personally by me on the following activities: development of treatment plan with patient and/or surrogate as well as nursing, discussions with consultants, evaluation of patient's response to treatment, examination of patient, obtaining history from patient or surrogate, ordering and performing treatments and interventions, ordering and review of laboratory studies, ordering and review of radiographic studies, pulse oximetry and re-evaluation of patient's  condition.  ____________________________________________   INITIAL IMPRESSION / ASSESSMENT AND PLAN / ED COURSE  Pertinent labs & imaging results that were available during my care of the patient were reviewed by me and considered in my medical decision making (see chart for details).  DDX: sepsis, stone, pyelo, hydro, colitis, pna  Shlonda A Burdick is a 56 y.o. who presents to the ED with Left flank pain fever and meeting septic criteria as described above. Patient with complex past medical history including sepsis secondary to stone requiring urological procedure. Patient without any evidence of pneumonia. Her abdominal exam is soft and benign. CT imaging ordered to evaluate for stone shows none but does show evidence of left perinephric stranding with left hydronephrosis.  Urine is surprisingly without any evidence of significant infection that based on her complaints of dysuria and increased frequency meeting septic criteria her presentation is best explained by urinary tract infection and likely acute pyelonephritis. Patient received IV Rocephin as well as 20 cc per KG IV fluid resuscitation with improvement in her tachycardia. Patient required admission for hemodynamic monitoring.  Have discussed with the patient and available family all diagnostics and treatments performed thus far and all questions were answered to the best of my ability. The patient demonstrates understanding and agreement with plan.       ____________________________________________   FINAL CLINICAL IMPRESSION(S) / ED DIAGNOSES  Final diagnoses:  Left flank pain  Sepsis, due to unspecified organism [facility])      NEW MEDICATIONS STARTED DURING THIS VISIT:  New Prescriptions   No medications on file     Note:  This document was prepared using Dragon voice recognition software and may include unintentional dictation errors.    Merlyn Lot, MD 11/15/16 Lupita Shutter

## 2016-11-15 DIAGNOSIS — Z888 Allergy status to other drugs, medicaments and biological substances status: Secondary | ICD-10-CM | POA: Diagnosis not present

## 2016-11-15 DIAGNOSIS — A419 Sepsis, unspecified organism: Secondary | ICD-10-CM | POA: Diagnosis present

## 2016-11-15 DIAGNOSIS — Z9884 Bariatric surgery status: Secondary | ICD-10-CM | POA: Diagnosis not present

## 2016-11-15 DIAGNOSIS — N132 Hydronephrosis with renal and ureteral calculous obstruction: Secondary | ICD-10-CM | POA: Diagnosis not present

## 2016-11-15 DIAGNOSIS — N1330 Unspecified hydronephrosis: Secondary | ICD-10-CM | POA: Diagnosis present

## 2016-11-15 DIAGNOSIS — Z79899 Other long term (current) drug therapy: Secondary | ICD-10-CM | POA: Diagnosis not present

## 2016-11-15 DIAGNOSIS — K219 Gastro-esophageal reflux disease without esophagitis: Secondary | ICD-10-CM | POA: Diagnosis present

## 2016-11-15 DIAGNOSIS — N12 Tubulo-interstitial nephritis, not specified as acute or chronic: Secondary | ICD-10-CM | POA: Diagnosis not present

## 2016-11-15 DIAGNOSIS — Z86718 Personal history of other venous thrombosis and embolism: Secondary | ICD-10-CM | POA: Diagnosis not present

## 2016-11-15 DIAGNOSIS — N390 Urinary tract infection, site not specified: Secondary | ICD-10-CM | POA: Diagnosis present

## 2016-11-15 DIAGNOSIS — Z885 Allergy status to narcotic agent status: Secondary | ICD-10-CM | POA: Diagnosis not present

## 2016-11-15 DIAGNOSIS — Z880 Allergy status to penicillin: Secondary | ICD-10-CM | POA: Diagnosis not present

## 2016-11-15 DIAGNOSIS — R109 Unspecified abdominal pain: Secondary | ICD-10-CM | POA: Diagnosis present

## 2016-11-15 DIAGNOSIS — Z87442 Personal history of urinary calculi: Secondary | ICD-10-CM | POA: Diagnosis not present

## 2016-11-15 DIAGNOSIS — N136 Pyonephrosis: Secondary | ICD-10-CM | POA: Diagnosis present

## 2016-11-15 DIAGNOSIS — Z91040 Latex allergy status: Secondary | ICD-10-CM | POA: Diagnosis not present

## 2016-11-15 DIAGNOSIS — Z801 Family history of malignant neoplasm of trachea, bronchus and lung: Secondary | ICD-10-CM | POA: Diagnosis not present

## 2016-11-15 LAB — CBC
HCT: 34.5 % — ABNORMAL LOW (ref 35.0–47.0)
Hemoglobin: 11.7 g/dL — ABNORMAL LOW (ref 12.0–16.0)
MCH: 27.7 pg (ref 26.0–34.0)
MCHC: 33.9 g/dL (ref 32.0–36.0)
MCV: 81.7 fL (ref 80.0–100.0)
Platelets: 175 10*3/uL (ref 150–440)
RBC: 4.22 MIL/uL (ref 3.80–5.20)
RDW: 15.5 % — ABNORMAL HIGH (ref 11.5–14.5)
WBC: 10.8 10*3/uL (ref 3.6–11.0)

## 2016-11-15 LAB — BASIC METABOLIC PANEL
Anion gap: 5 (ref 5–15)
BUN: 13 mg/dL (ref 6–20)
CO2: 23 mmol/L (ref 22–32)
Calcium: 8.1 mg/dL — ABNORMAL LOW (ref 8.9–10.3)
Chloride: 109 mmol/L (ref 101–111)
Creatinine, Ser: 0.51 mg/dL (ref 0.44–1.00)
GFR calc Af Amer: >60 mL/min (ref >60)
GFR calc non Af Amer: >60 mL/min (ref >60)
Glucose, Bld: 129 mg/dL — ABNORMAL HIGH (ref 65–99)
Potassium: 3.5 mmol/L (ref 3.5–5.1)
Sodium: 137 mmol/L (ref 135–145)

## 2016-11-15 MED ORDER — ACETAMINOPHEN 325 MG PO TABS
650.0000 mg | ORAL_TABLET | Freq: Once | ORAL | Status: AC
  Administered 2016-11-15: 650 mg via ORAL
  Filled 2016-11-15: qty 2

## 2016-11-15 MED ORDER — ENOXAPARIN SODIUM 40 MG/0.4ML ~~LOC~~ SOLN
40.0000 mg | SUBCUTANEOUS | Status: DC
  Administered 2016-11-15 – 2016-11-16 (×2): 40 mg via SUBCUTANEOUS
  Filled 2016-11-15 (×2): qty 0.4

## 2016-11-15 MED ORDER — SODIUM CHLORIDE 0.9 % IV SOLN
1000.0000 mL | INTRAVENOUS | Status: DC
  Administered 2016-11-15 – 2016-11-17 (×5): 1000 mL via INTRAVENOUS

## 2016-11-15 MED ORDER — MISOPROSTOL 200 MCG PO TABS
200.0000 ug | ORAL_TABLET | Freq: Four times a day (QID) | ORAL | Status: DC
  Administered 2016-11-15 – 2016-11-17 (×6): 200 ug via ORAL
  Filled 2016-11-15 (×12): qty 1

## 2016-11-15 MED ORDER — ACETAMINOPHEN 325 MG PO TABS
650.0000 mg | ORAL_TABLET | Freq: Four times a day (QID) | ORAL | Status: DC | PRN
  Administered 2016-11-15: 650 mg via ORAL
  Filled 2016-11-15: qty 2

## 2016-11-15 MED ORDER — ACETAMINOPHEN 650 MG RE SUPP: 650.0000 mg | Freq: Four times a day (QID) | RECTAL | Status: DC | PRN

## 2016-11-15 MED ORDER — MONTELUKAST SODIUM 10 MG PO TABS
10.0000 mg | ORAL_TABLET | Freq: Every day | ORAL | Status: DC
  Administered 2016-11-15 – 2016-11-16 (×2): 10 mg via ORAL
  Filled 2016-11-15 (×2): qty 1

## 2016-11-15 MED ORDER — ACETAMINOPHEN 325 MG PO TABS
650.0000 mg | ORAL_TABLET | ORAL | Status: DC | PRN
  Administered 2016-11-15 – 2016-11-17 (×7): 650 mg via ORAL
  Filled 2016-11-15 (×7): qty 2

## 2016-11-15 MED ORDER — ONDANSETRON HCL 4 MG/2ML IJ SOLN: 4.0000 mg | Freq: Four times a day (QID) | INTRAMUSCULAR | Status: DC | PRN

## 2016-11-15 MED ORDER — KETOROLAC TROMETHAMINE 15 MG/ML IJ SOLN: 15.0000 mg | Freq: Four times a day (QID) | INTRAMUSCULAR | Status: DC | PRN

## 2016-11-15 MED ORDER — KETOROLAC TROMETHAMINE 15 MG/ML IJ SOLN
15.0000 mg | Freq: Four times a day (QID) | INTRAMUSCULAR | Status: DC | PRN
  Administered 2016-11-15: 15 mg via INTRAVENOUS
  Filled 2016-11-15: qty 1

## 2016-11-15 MED ORDER — PANTOPRAZOLE SODIUM 40 MG PO TBEC
40.0000 mg | DELAYED_RELEASE_TABLET | Freq: Two times a day (BID) | ORAL | Status: DC
  Administered 2016-11-15 – 2016-11-17 (×6): 40 mg via ORAL
  Filled 2016-11-15 (×7): qty 1

## 2016-11-15 MED ORDER — ONDANSETRON HCL 4 MG PO TABS: 4.0000 mg | ORAL_TABLET | Freq: Four times a day (QID) | ORAL | Status: DC | PRN

## 2016-11-15 MED ORDER — SUCRALFATE 1 G PO TABS
1.0000 g | ORAL_TABLET | Freq: Three times a day (TID) | ORAL | Status: DC
  Administered 2016-11-15 – 2016-11-17 (×8): 1 g via ORAL
  Filled 2016-11-15 (×8): qty 1

## 2016-11-15 NOTE — Progress Notes (Signed)
Subjective: Patient admitted last night with pyelonephritis. Patient having fever and chills this morning. Says her left flank pain is improved.  Objective: Vital signs in last 24 hours: Temp:  [98.9 F (37.2 C)-102.7 F (39.3 C)] 102.6 F (39.2 C) (06/30 1230) Pulse Rate:  [97-124] 100 (06/30 0741) Resp:  [18-22] 18 (06/30 0213) BP: (96-137)/(57-72) 117/65 (06/30 0741) SpO2:  [96 %-98 %] 97 % (06/30 0213) Weight:  [87.1 kg (192 lb)-91.2 kg (201 lb)] 91.2 kg (201 lb) (06/30 0213) Weight change:  Last BM Date: 11/14/16  Intake/Output from previous day: 06/29 0701 - 06/30 0700 In: 2525 [I.V.:475; IV Piggyback:2050] Out: -  Intake/Output this shift: Total I/O In: 120 [P.O.:120] Out: -   General appearance: alert, cooperative and no distress Resp: clear to auscultation bilaterally and normal percussion bilaterally Cardio: regular rate and rhythm, S1, S2 normal, no murmur, click, rub or gallop GI: Left flank tenderness. No rebound or guarding. Normal bowel sounds. Neurologic: Alert and oriented X 3, normal strength and tone. Normal symmetric reflexes. Normal coordination and gait  Lab Results:  Recent Labs  11/14/16 2149 11/15/16 0319  WBC 12.6* 10.8  HGB 13.0 11.7*  HCT 38.6 34.5*  PLT 216 175   BMET  Recent Labs  11/14/16 2149 11/15/16 0319  NA 136 137  K 3.7 3.5  CL 103 109  CO2 24 23  GLUCOSE 121* 129*  BUN 19 13  CREATININE 0.69 0.51  CALCIUM 9.1 8.1*    Studies/Results: Dg Chest 2 View  Result Date: 11/14/2016 CLINICAL DATA:  Left flank pain EXAM: CHEST  2 VIEW COMPARISON:  06/20/2014 FINDINGS: Surgical clips in the right upper quadrant. Coarse interstitial pulmonary opacity, chronic. No focal consolidation or effusion. Normal cardiomediastinal silhouette. No pneumothorax. IMPRESSION: No active cardiopulmonary disease. Electronically Signed   By: Donavan Foil M.D.   On: 11/14/2016 22:33   Ct Renal Stone Study  Result Date: 11/14/2016 CLINICAL DATA:   LEFT flank pain for 2 days. Fever. History of kidney stones and sepsis. EXAM: CT ABDOMEN AND PELVIS WITHOUT CONTRAST TECHNIQUE: Multidetector CT imaging of the abdomen and pelvis was performed following the standard protocol without IV contrast. COMPARISON:  Abdominal radiograph September 14, 2015 and CT abdomen and pelvis June 20, 2013 FINDINGS: LOWER CHEST: Lung bases are clear. The visualized heart size is normal. No pericardial effusion. HEPATOBILIARY: Status post cholecystectomy. Negative noncontrast CT liver. PANCREAS: Normal. SPLEEN: Normal. ADRENALS/URINARY TRACT: Kidneys are orthotopic, demonstrating normal size and morphology. Mild LEFT hydronephrosis. No urolithiasis. Limited assessment for renal masses on this nonenhanced examination. Mild LEFT perinephric fat stranding. Urinary bladder is partially distended and unremarkable. Normal adrenal glands. STOMACH/BOWEL: Status post gastric bypass. Mild colonic diverticulosis. Sensitivity decreased by lack of enteric contrast. Normal appendix. VASCULAR/LYMPHATIC: Aortoiliac vessels are normal in course and caliber. No lymphadenopathy by CT size criteria. REPRODUCTIVE: Testis hysterectomy. Small amount of nonspecific free fluid in the pelvis. OTHER: No intraperitoneal free fluid or free air. MUSCULOSKELETAL: Non-acute. Anterior pelvic wall scarring. Focal fat necrosis LEFT anterior pelvic wall subcutaneous fat. IMPRESSION: Mild LEFT hydronephrosis without urolithiasis. Electronically Signed   By: Elon Alas M.D.   On: 11/14/2016 22:42    Medications: I have reviewed the patient's current medications.  Assessment/Plan: 1. Pyelonephritis. Likely the result of a kidney stone. CT scan did not show any further evidence of stone. Patient believes she has passed it. Her symptoms correlate with that. Spoke with urology and they will also follow the patient. Continue IV Rocephin. Follow up on cultures.  2. Renal calculi. Patient has a history of having  stones. Suspect she passed one this time S Watts provoked her pyelonephritis. No evidence of stone on CT. 3. Fever. Patient continues to spike fevers 102. We'll continue to give her Tylenol and cooling blanket. She cannot take NSAIDs because a history of gastric bypass surgery. Patient's fever curve may fluctuate for next 24-48 hours. She is on appropriate antibiotics but will follow up on cultures and adjust if needed. X on excellent total time spent 20 minutes.  LOS: 0 days   Baxter Hire 11/15/2016, 1:46 PM

## 2016-11-15 NOTE — Progress Notes (Signed)
Pharmacy Antibiotic Note  Sharon [name] is a 56 y.o. female admitted on 11/14/2016 with UTI.  Pharmacy has been consulted for ceftriaxone dosing.  Plan: Ceftriaxone 2 grams q 24 hours ordered.  Height: 5\' 7"  (170.2 cm) Weight: 192 lb (87.1 kg) IBW/kg (Calculated) : 61.6  Temp (24hrs), Avg:99.2 F (37.3 C), Min:98.9 F (37.2 C), Max:99.4 F (37.4 C)   Recent Labs Lab 11/14/16 2149  WBC 12.6*  CREATININE 0.69  LATICACIDVEN 0.7    Estimated Creatinine Clearance: 90.1 mL/min (by C-G formula based on SCr of 0.69 mg/dL).    Allergies  Allergen Reactions  . Alcohol Other (See Comments)    wool  . Bacitracin-Neomycin-Polymyxin Other (See Comments)  . Codeine Nausea Only and Other (See Comments)    "headache"  . Hydrocodone Other (See Comments)  . Hydrocortisone Other (See Comments)  . Lanolin Other (See Comments)    "rash"  . Nickel Other (See Comments)  . Other Other (See Comments)    Bitartrate/black rubber dye/elastic  . Tape Other (See Comments)  . Tramadol Other (See Comments)    "headache"  . Bacitracin Rash  . Clarithromycin Rash  . Latex Rash  . Oxycodone Rash  . Penicillins Rash    Has patient had a PCN reaction causing immediate rash, facial/tongue/throat swelling, SOB or lightheadedness with hypotension: yes Has patient had a PCN reaction causing severe rash involving mucus membranes or skin necrosis: no Has patient had a PCN reaction that required hospitalization No Has patient had a PCN reaction occurring within the last 10 years: No If all of the above answers are "NO", then may proceed with Cephalosporin use.   . Sulfamethoxazole-Trimethoprim Rash    Antimicrobials this admission: ceftriaxone 6/29 >>    >>   Dose adjustments this admission:   Microbiology results: 6/29 BCx: pending 6/29 UCx: pending       6/29 UA: LE(tr) NO2(-) WBC 0-5 6/29 CXR: no active disease  Thank you for allowing pharmacy to be a part of this patient's  care.  Sharon [name] S 11/15/2016 12:00 AM

## 2016-11-15 NOTE — Progress Notes (Signed)
Pt with noted fever above 102 on multiple occasions this am. Tylenol given every 4 hours with little effect. MD notified. Ice packs placed around patient to reduce fever, blankets removed, temperature in room reduced. Incentive spirometer provided and pt educated. Per Dr. Edwina Barth, continue IV abx and continue to monitor and notify if pt status changes. Increased order for NS to 166mL/hr.

## 2016-11-15 NOTE — Progress Notes (Signed)
Spoke with Dr. Edwina Barth about fever of 102.4, he said to give Tylenol dose now.

## 2016-11-15 NOTE — Progress Notes (Signed)
Pt continues with fevers of 100-102. Ice packs placed behind neck and groin. Pt having good urine output, no stones or blood noted. IV fluids infusing, iv abx with no adverse effect. PO fluids encouraged. Pt c/o left flank pain and hot/cold flashes.

## 2016-11-15 NOTE — Progress Notes (Signed)
Pt alert and oriented. Up to bathroom with assistance complaining of urinary frequency. Still with some complaints of flank pain this shift. Iv infusing without difficulty.

## 2016-11-15 NOTE — Consult Note (Signed)
I have been asked to see the patient by Dr. Harrel Lemon, for evaluation and management of left pyelonephritis.  History of present illness: Sharon [name] with several days of flank pain, seen in ED when she began to spike fever.  History of nephrolithiasis in the past resulting in urosepsis.  CT scan demonstrated mild hydronephrosis with no evidence of obstructing stone - likely passed. She has had intermittent fevers although it to 102. Her pain is a soreness which is constant rather than the colic type pain that she experienced prior to her presentation. She is on broad-spectrum antibiotics, and states that she feels better currently. She continues to feel achy diffusely with localized pain in the left flank region. Interestingly, she is not having any significant urinary tract symptoms including frequency, dysuria, urgency or incontinence.  The patient was admitted for urosepsis in 2015, and she was treated by Dr. Erlene Quan. She also has a history of urinary urge/incontinence for whom she is seen Zara Council, PA.  Review of systems: A 12 point comprehensive review of systems was obtained and is negative unless otherwise stated in the history of present illness.  Patient Active Problem List   Diagnosis Date Noted  . Sepsis ([place]) 11/15/2016  . Hydronephrosis 11/15/2016  . GERD (gastroesophageal reflux disease) 11/15/2016  . UTI (urinary tract infection) 11/15/2016  . Right leg DVT ([place]) 01/24/2016  . Kidney stones 09/10/2015  . Urge incontinence 09/10/2015  . Nocturia 09/10/2015    No current facility-administered medications on file prior to encounter.    Current Outpatient Prescriptions on File Prior to Encounter  Medication Sig Dispense Refill  . EPINEPHrine (EPIPEN 2-PAK) 0.3 mg/0.3 mL IJ SOAJ injection Inject 0.3 mg into the muscle as needed. For anaphylaxis    . levocetirizine (XYZAL) 5 MG tablet Take 5 mg by mouth every evening.    . montelukast (SINGULAIR) 10 MG tablet Take 10 mg by  mouth at bedtime.    . [DISCONTINUED] famotidine (PEPCID) 40 MG tablet Take 40 mg by mouth daily. Reported on 09/25/2015    . [DISCONTINUED] phentermine (ADIPEX-P) 37.5 MG tablet Take by mouth. Reported on 09/25/2015      Past Medical History:  Diagnosis Date  . Acid reflux   . Anxiety   . Arthritis   . Bladder spasm   . Depression   . Headache   . Kidney calculi   . Left ureteral stone   . Leg DVT (deep venous thromboembolism), acute ([place])   . Restless leg syndrome   . Sleep apnea    does not have C-PAP @ present  . Urge incontinence     Past Surgical History:  Procedure Laterality Date  . ABDOMINAL HYSTERECTOMY     TOTAL ABDOMINAL HYSTERECTOMY W/ BILATERAL SALPINGOOPHORECTOMY  . BARIATRIC SURGERY  05/15/2016  . CHOLECYSTECTOMY    . ESOPHAGOGASTRODUODENOSCOPY  05/15/2016  . KIDNEY STONE SURGERY    . KNEE ARTHROSCOPY Right    X 2  . SHOULDER ARTHROSCOPY WITH OPEN ROTATOR CUFF REPAIR Right 11/22/2015   Procedure: SHOULDER ARTHROSCOPY, DEBRIDEMENT, DECOMPRESSION, SLAP REPAIR, POSSIBLE BICEP TENODESIS;  Surgeon: Corky Mull, MD;  Location: [facility] ORS;  Service: Orthopedics;  Laterality: Right;  . SINUS EXPLORATION      Social History  Substance Use Topics  . Smoking status: Never Smoker  . Smokeless tobacco: Never Used  . Alcohol use No    Family History  Problem Relation Age of Onset  . Cancer Mother   . Lung cancer Father   . Hematuria  Brother   . Kidney disease Paternal Grandfather   . Kidney Stones Brother   . Bladder Cancer Neg Hx     PE: Vitals:   11/15/16 1001 11/15/16 1115 11/15/16 1230 11/15/16 1419  BP:    (!) 110/57  Pulse:    93  Resp:    18  Temp: (!) 100.9 F (38.3 C) (!) 102 F (38.9 C) (!) 102.6 F (39.2 C) (!) 100.4 F (38 C)  TempSrc: Oral Oral Oral Oral  SpO2:    95%  Weight:      Height:       Patient appears to be in no acute distress  patient is alert and oriented x3 Atraumatic normocephalic head No cervical or supraclavicular  lymphadenopathy appreciated No increased work of breathing, no audible wheezes/rhonchi Regular sinus rhythm/rate Abdomen is soft, nondistended, Significant left-sided CVA tenderness, mild left abdominal tenderness. Lower extremities are symmetric without appreciable edema Grossly neurologically intact No identifiable skin lesions   Recent Labs  11/14/16 2149 11/15/16 0319  WBC 12.6* 10.8  HGB 13.0 11.7*  HCT 38.6 34.5*    Recent Labs  11/14/16 2149 11/15/16 0319  NA 136 137  K 3.7 3.5  CL 103 109  CO2 24 23  GLUCOSE 121* 129*  BUN 19 13  CREATININE 0.69 0.51  CALCIUM 9.1 8.1*    Recent Labs  11/14/16 2149  INR 1.11   No results for input(s): LABURIN in the last 72 hours. Results for orders placed or performed during the hospital encounter of 11/14/16  Culture, blood (Routine x 2)     Status: None (Preliminary result)   Collection Time: 11/14/16  9:49 PM  Result Value Ref Range Status   Specimen Description BLOOD BLOOD LEFT WRIST  Final   Special Requests   Final    BOTTLES DRAWN AEROBIC AND ANAEROBIC Blood Culture results may not be optimal due to an excessive volume of blood received in culture bottles   Culture NO GROWTH < 12 HOURS  Final   Report Status PENDING  Incomplete  Culture, blood (Routine x 2)     Status: None (Preliminary result)   Collection Time: 11/14/16  9:49 PM  Result Value Ref Range Status   Specimen Description BLOOD RIGHT ANTECUBITAL  Final   Special Requests   Final    BOTTLES DRAWN AEROBIC AND ANAEROBIC Blood Culture adequate volume   Culture NO GROWTH < 12 HOURS  Final   Report Status PENDING  Incomplete    Imaging: I've independent reviewed the patient's CT scan noting mild left hydroureteronephrosis with no evidence of any obstructing stones.  Imp: The patient most likely has a left pyelonephritis at this point after likely passing a small left ureteral stone.    Recommend conservative management with abx.  Expect her fever  will trend down over the next 48 hours.  F/u culture and place patient on culture specific oral abx x 2 weeks total.  I will schedule follow-up with urology in the next 2-4 weeks.   Thank you for involving me in this patient's care.  Please page with any further questions or concerns. Louis Meckel W

## 2016-11-15 NOTE — Progress Notes (Signed)
Pt with a fever of 101.6 and not able to have any more tylenol at this time. Spoke with Dr. Marcille Blanco md to place orders.

## 2016-11-15 NOTE — Progress Notes (Signed)
Per Dr. Marcille Blanco ok to give Toradol for fever also.

## 2016-11-15 NOTE — H&P (Signed)
[facility] NAME: Sharon [name]    MR#:  [id number]  DATE OF BIRTH:  [birth year]  DATE OF ADMISSION:  11/14/2016  PRIMARY CARE PHYSICIAN: Ricardo Jericho, NP   REQUESTING/REFERRING PHYSICIAN: Quentin Cornwall, MD  CHIEF COMPLAINT:   Chief Complaint  Patient presents with  . Flank Pain  . Fever    HISTORY OF PRESENT ILLNESS:  Sharon [name]  is a 56 y.o. female who presents with Left flank pain. Patient states that this pain started about 5 days ago. She had significant intermittent waves of colicky type pain, and had difficulty urinating until about a day and a half ago. She states that she was not paying attention to see if she passed any renal stones or not. She does have a history of having sepsis with an obstructing renal stone in the past which required surgical procedure. Today in the ED she was found to have some left-sided hydronephrosis, she came in febrile met sepsis criteria. Hospitalists were called for admission  PAST MEDICAL HISTORY:   Past Medical History:  Diagnosis Date  . Acid reflux   . Anxiety   . Arthritis   . Bladder spasm   . Depression   . Headache   . Kidney calculi   . Left ureteral stone   . Leg DVT (deep venous thromboembolism), acute ([place])   . Restless leg syndrome   . Sleep apnea    does not have C-PAP @ present  . Urge incontinence     PAST SURGICAL HISTORY:   Past Surgical History:  Procedure Laterality Date  . ABDOMINAL HYSTERECTOMY     TOTAL ABDOMINAL HYSTERECTOMY W/ BILATERAL SALPINGOOPHORECTOMY  . BARIATRIC SURGERY  05/15/2016  . CHOLECYSTECTOMY    . ESOPHAGOGASTRODUODENOSCOPY  05/15/2016  . KIDNEY STONE SURGERY    . KNEE ARTHROSCOPY Right    X 2  . SHOULDER ARTHROSCOPY WITH OPEN ROTATOR CUFF REPAIR Right 11/22/2015   Procedure: SHOULDER ARTHROSCOPY, DEBRIDEMENT, DECOMPRESSION, SLAP REPAIR, POSSIBLE BICEP TENODESIS;  Surgeon: Corky Mull, MD;  Location: [facility] ORS;   Service: Orthopedics;  Laterality: Right;  . SINUS EXPLORATION      SOCIAL HISTORY:   Social History  Substance Use Topics  . Smoking status: Never Smoker  . Smokeless tobacco: Never Used  . Alcohol use No    FAMILY HISTORY:   Family History  Problem Relation Age of Onset  . Cancer Mother   . Lung cancer Father   . Hematuria Brother   . Kidney disease Paternal Grandfather   . Kidney Stones Brother   . Bladder Cancer Neg Hx     DRUG ALLERGIES:   Allergies  Allergen Reactions  . Alcohol Other (See Comments)    wool  . Bacitracin-Neomycin-Polymyxin Other (See Comments)  . Codeine Nausea Only and Other (See Comments)    "headache"  . Hydrocodone Other (See Comments)  . Hydrocortisone Other (See Comments)  . Lanolin Other (See Comments)    "rash"  . Nickel Other (See Comments)  . Other Other (See Comments)    Bitartrate/black rubber dye/elastic  . Tape Other (See Comments)  . Tramadol Other (See Comments)    "headache"  . Bacitracin Rash  . Clarithromycin Rash  . Latex Rash  . Oxycodone Rash  . Penicillins Rash    Has patient had a PCN reaction causing immediate rash, facial/tongue/throat swelling, SOB or lightheadedness with hypotension: yes Has patient had a PCN reaction causing severe rash involving mucus membranes  or skin necrosis: no Has patient had a PCN reaction that required hospitalization No Has patient had a PCN reaction occurring within the last 10 years: No If all of the above answers are "NO", then may proceed with Cephalosporin use.   Octaviano Glow Rash    MEDICATIONS AT HOME:   Prior to Admission medications   Medication Sig Start Date End Date Taking? Authorizing Provider  EPINEPHrine (EPIPEN 2-PAK) 0.3 mg/0.3 mL IJ SOAJ injection Inject 0.3 mg into the muscle as needed. For anaphylaxis   Yes [provider]  levocetirizine (XYZAL) 5 MG tablet Take 5 mg by mouth every evening.   Yes [provider]   misoprostol (CYTOTEC) 200 MCG tablet Take 200 mcg by mouth 4 (four) times daily. 10/08/16  Yes [provider]  montelukast (SINGULAIR) 10 MG tablet Take 10 mg by mouth at bedtime.   Yes [provider]  pantoprazole (PROTONIX) 40 MG tablet Take 40 mg by mouth 2 (two) times daily. 10/08/16  Yes [provider]  sucralfate (CARAFATE) 1 GM/10ML suspension Take 10 mLs by mouth 4 (four) times daily -  before meals and at bedtime. 10/08/16 01/06/17 Yes [provider]    REVIEW OF SYSTEMS:  Review of Systems  Constitutional: Positive for chills and fever. Negative for malaise/fatigue and weight loss.  HENT: Negative for ear pain, hearing loss and tinnitus.   Eyes: Negative for blurred vision, double vision, pain and redness.  Respiratory: Negative for cough, hemoptysis and shortness of breath.   Cardiovascular: Negative for chest pain, palpitations, orthopnea and leg swelling.  Gastrointestinal: Negative for abdominal pain, constipation, diarrhea, nausea and vomiting.  Genitourinary: Positive for dysuria and flank pain. Negative for frequency and hematuria.  Musculoskeletal: Negative for back pain, joint pain and neck pain.  Skin:       No acne, rash, or lesions  Neurological: Negative for dizziness, tremors, focal weakness and weakness.  Endo/Heme/Allergies: Negative for polydipsia. Does not bruise/bleed easily.  Psychiatric/Behavioral: Negative for depression. The patient is not nervous/anxious and does not have insomnia.      VITAL SIGNS:   Vitals:   11/14/16 2138 11/14/16 2139 11/14/16 2200 11/14/16 2305  BP: 121/72  (!) 120/57 111/61  Pulse: (!) 124  (!) 108 98  Resp: (!) 22  (!) 22 18  Temp: 99.4 F (37.4 C)   98.9 F (37.2 C)  TempSrc: Oral   Oral  SpO2: 96%  96% 98%  Weight:  87.1 kg (192 lb)    Height:  _0  (1.702 m)     Wt Readings from Last 3 Encounters:  11/14/16 87.1 kg (192 lb)  07/18/16 103.2 kg (227 lb 8.2 oz)  07/08/16 102.7 kg  (226 lb 4.8 oz)    PHYSICAL EXAMINATION:  Physical Exam  Vitals reviewed. Constitutional: She is oriented to person, place, and time. She appears well-developed and well-nourished. No distress.  HENT:  Head: Normocephalic and atraumatic.  Mouth/Throat: Oropharynx is clear and moist.  Eyes: Conjunctivae and EOM are normal. Pupils are equal, round, and reactive to light. No scleral icterus.  Neck: Normal range of motion. Neck supple. No JVD present. No thyromegaly present.  Cardiovascular: Normal rate, regular rhythm and intact distal pulses.  Exam reveals no gallop and no friction rub.   No murmur heard. Respiratory: Effort normal and breath sounds normal. No respiratory distress. She has no wheezes. She has no rales.  GI: Soft. Bowel sounds are normal. She exhibits no distension. There is no tenderness.  Musculoskeletal: Normal range of motion. She exhibits tenderness (Left flank). She exhibits no edema.  No arthritis, no gout  Lymphadenopathy:    She has no cervical adenopathy.  Neurological: She is alert and oriented to person, place, and time. No cranial nerve deficit.  No dysarthria, no aphasia  Skin: Skin is warm and dry. No rash noted. No erythema.  Psychiatric: She has a normal mood and affect. Her behavior is normal. Judgment and thought content normal.    LABORATORY PANEL:   CBC  Recent Labs Lab 11/14/16 2149  WBC 12.6*  HGB 13.0  HCT 38.6  PLT 216   ------------------------------------------------------------------------------------------------------------------  Chemistries   Recent Labs Lab 11/14/16 2149  NA 136  K 3.7  CL 103  CO2 24  GLUCOSE 121*  BUN 19  CREATININE 0.69  CALCIUM 9.1  AST 22  ALT 16  ALKPHOS 73  BILITOT 1.5*   ------------------------------------------------------------------------------------------------------------------  Cardiac Enzymes No results for input(s): TROPONINI in the last 168  hours. ------------------------------------------------------------------------------------------------------------------  RADIOLOGY:  Dg Chest 2 View  Result Date: 11/14/2016 CLINICAL DATA:  Left flank pain EXAM: CHEST  2 VIEW COMPARISON:  06/20/2014 FINDINGS: Surgical clips in the right upper quadrant. Coarse interstitial pulmonary opacity, chronic. No focal consolidation or effusion. Normal cardiomediastinal silhouette. No pneumothorax. IMPRESSION: No active cardiopulmonary disease. Electronically Signed   By: Donavan Foil M.D.   On: 11/14/2016 22:33   Ct Renal Stone Study  Result Date: 11/14/2016 CLINICAL DATA:  LEFT flank pain for 2 days. Fever. History of kidney stones and sepsis. EXAM: CT ABDOMEN AND PELVIS WITHOUT CONTRAST TECHNIQUE: Multidetector CT imaging of the abdomen and pelvis was performed following the standard protocol without IV contrast. COMPARISON:  Abdominal radiograph September 14, 2015 and CT abdomen and pelvis June 20, 2013 FINDINGS: LOWER CHEST: Lung bases are clear. The visualized heart size is normal. No pericardial effusion. HEPATOBILIARY: Status post cholecystectomy. Negative noncontrast CT liver. PANCREAS: Normal. SPLEEN: Normal. ADRENALS/URINARY TRACT: Kidneys are orthotopic, demonstrating normal size and morphology. Mild LEFT hydronephrosis. No urolithiasis. Limited assessment for renal masses on this nonenhanced examination. Mild LEFT perinephric fat stranding. Urinary bladder is partially distended and unremarkable. Normal adrenal glands. STOMACH/BOWEL: Status post gastric bypass. Mild colonic diverticulosis. Sensitivity decreased by lack of enteric contrast. Normal appendix. VASCULAR/LYMPHATIC: Aortoiliac vessels are normal in course and caliber. No lymphadenopathy by CT size criteria. REPRODUCTIVE: Testis hysterectomy. Small amount of nonspecific free fluid in the pelvis. OTHER: No intraperitoneal free fluid or free air. MUSCULOSKELETAL: Non-acute. Anterior pelvic wall  scarring. Focal fat necrosis LEFT anterior pelvic wall subcutaneous fat. IMPRESSION: Mild LEFT hydronephrosis without urolithiasis. Electronically Signed   By: Elon Alas M.D.   On: 11/14/2016 22:42    EKG:   Orders placed or performed in visit on 11/14/16  . EKG 12-Lead    IMPRESSION AND PLAN:  Principal Problem:   Sepsis ([place]) - IV antibiotics and place, patient is hemodynamically stable. Active Problems:   Hydronephrosis - suspect patient may have passed a kidney stone, no stone currently identified on CT scan tonight, urology consult in place   UTI (urinary tract infection) - IV antibiotics as above, culture sent   GERD (gastroesophageal reflux disease) - home dose PPI  All the records are reviewed and case discussed with ED provider. Management plans discussed with the patient and/or family.  DVT PROPHYLAXIS: SubQ lovenox  GI PROPHYLAXIS: PPI  ADMISSION STATUS: Inpatient  CODE STATUS: Full Code Status History    This patient does not have a recorded code  status. Please follow your organizational policy for patients in this situation.      TOTAL TIME TAKING CARE OF THIS PATIENT: 45 minutes.   Fatina Sprankle [place]loch 11/15/2016, 12:43 AM  [facility]  Office  [phone]  CC: Primary care physician; Ricardo Jericho, NP  Note:  This document was prepared using Dragon voice recognition software and may include unintentional dictation errors.

## 2016-11-15 NOTE — Progress Notes (Signed)
Urologist paged as per orders. Per Dr. Louis Meckel, please have attending MD call this consult.

## 2016-11-15 NOTE — ED Notes (Signed)
Bed found ready att; other bed ready for tele at same time, that report called first; pt aware

## 2016-11-15 NOTE — Consult Note (Signed)
I have reviewed this patient's chart and spoken to Dr. Edwina Barth about her. Formal consult to follow.  89F with several days of flank pain, seen in ED when she began to spike fever.  History of nephrolithiasis in the past resulting in urosepsis.  CT scan demonstrated mild hydronephrosis with no evidence of obstructing stone - likely passed.  She does have significant stranding around the kidney suggestive of pyelonephritis, which clinically fits with fever and flank pain.  Recommend conservative management with abx.  Expect her fever will trend down over the next 48 hours.  F/u culture and place patient on culture specific oral abx x 2 weeks total.

## 2016-11-16 LAB — URINE CULTURE

## 2016-11-16 LAB — HIV ANTIBODY (ROUTINE TESTING W REFLEX): HIV Screen 4th Generation wRfx: NONREACTIVE

## 2016-11-16 NOTE — Progress Notes (Addendum)
Pt feeling better today, flank pain and HA have improved, fevers improved, pt states she is more comfortable, no chills today. Pt is indep to BR, good urine output, appetite is fair. PRN tylenol given for mild headache. Family at bedside most of this shift.

## 2016-11-16 NOTE — Progress Notes (Signed)
Subjective: Patient since her left-sided flank pain is improved this morning. No nausea or vomiting.  Objective: Vital signs in last 24 hours: Temp:  [100.1 F (37.8 C)-102.7 F (39.3 C)] 100.1 F (37.8 C) (06/30 2253) Pulse Rate:  [91-100] 91 (06/30 2253) Resp:  [18-20] 20 (06/30 2253) BP: (101-117)/(57-65) 101/65 (06/30 2253) SpO2:  [94 %-95 %] 94 % (06/30 2253) Weight change:  Last BM Date: 11/14/16  Intake/Output from previous day: 06/30 0701 - 07/01 0700 In: 2762.5 [P.O.:480; I.V.:2232.5; IV Piggyback:50] Out: 1050 [Urine:1050] Intake/Output this shift: No intake/output data recorded.  General appearance: no distress Resp: clear to auscultation bilaterally and normal percussion bilaterally Cardio: regular rate and rhythm, S1, S2 normal, no murmur, click, rub or gallop GI: Mild left flank tenderness. No rebound or guarding. Bowel sounds are positive.  Lab Results:  Recent Labs  11/14/16 2149 11/15/16 0319  WBC 12.6* 10.8  HGB 13.0 11.7*  HCT 38.6 34.5*  PLT 216 175   BMET  Recent Labs  11/14/16 2149 11/15/16 0319  NA 136 137  K 3.7 3.5  CL 103 109  CO2 24 23  GLUCOSE 121* 129*  BUN 19 13  CREATININE 0.69 0.51  CALCIUM 9.1 8.1*    Studies/Results: Dg Chest 2 View  Result Date: 11/14/2016 CLINICAL DATA:  Left flank pain EXAM: CHEST  2 VIEW COMPARISON:  06/20/2014 FINDINGS: Surgical clips in the right upper quadrant. Coarse interstitial pulmonary opacity, chronic. No focal consolidation or effusion. Normal cardiomediastinal silhouette. No pneumothorax. IMPRESSION: No active cardiopulmonary disease. Electronically Signed   By: Donavan Foil M.D.   On: 11/14/2016 22:33   Ct Renal Stone Study  Result Date: 11/14/2016 CLINICAL DATA:  LEFT flank pain for 2 days. Fever. History of kidney stones and sepsis. EXAM: CT ABDOMEN AND PELVIS WITHOUT CONTRAST TECHNIQUE: Multidetector CT imaging of the abdomen and pelvis was performed following the standard protocol  without IV contrast. COMPARISON:  Abdominal radiograph September 14, 2015 and CT abdomen and pelvis June 20, 2013 FINDINGS: LOWER CHEST: Lung bases are clear. The visualized heart size is normal. No pericardial effusion. HEPATOBILIARY: Status post cholecystectomy. Negative noncontrast CT liver. PANCREAS: Normal. SPLEEN: Normal. ADRENALS/URINARY TRACT: Kidneys are orthotopic, demonstrating normal size and morphology. Mild LEFT hydronephrosis. No urolithiasis. Limited assessment for renal masses on this nonenhanced examination. Mild LEFT perinephric fat stranding. Urinary bladder is partially distended and unremarkable. Normal adrenal glands. STOMACH/BOWEL: Status post gastric bypass. Mild colonic diverticulosis. Sensitivity decreased by lack of enteric contrast. Normal appendix. VASCULAR/LYMPHATIC: Aortoiliac vessels are normal in course and caliber. No lymphadenopathy by CT size criteria. REPRODUCTIVE: Testis hysterectomy. Small amount of nonspecific free fluid in the pelvis. OTHER: No intraperitoneal free fluid or free air. MUSCULOSKELETAL: Non-acute. Anterior pelvic wall scarring. Focal fat necrosis LEFT anterior pelvic wall subcutaneous fat. IMPRESSION: Mild LEFT hydronephrosis without urolithiasis. Electronically Signed   By: Elon Alas M.D.   On: 11/14/2016 22:42    Medications: I have reviewed the patient's current medications.  Assessment/Plan: 1. Pyelonephritis. Likely the result of a kidney stone. CT scan did not show any further evidence of stone. Patient has apparently passed the stone. Currently on IV Rocephin. Urine cultures are unrevealing at this point. We'll continue current antibiotic therapy. 2. Renal calculi. Patient has a history of having stones. Suspect she passed the stone earlier. CT does not show any evidence of stone currently.  3. Fever. Fever curve has improved overnight. Continue Tylenol for fever. Cannot take NSAIDs because of gastric bypass surgery.  Total time  spent 15  minutes  LOS: 1 day   Baxter Hire 11/16/2016, 7:35 AM

## 2016-11-17 LAB — BASIC METABOLIC PANEL
Anion gap: 5 (ref 5–15)
BUN: 10 mg/dL (ref 6–20)
CO2: 23 mmol/L (ref 22–32)
Calcium: 8.1 mg/dL — ABNORMAL LOW (ref 8.9–10.3)
Chloride: 110 mmol/L (ref 101–111)
Creatinine, Ser: 0.45 mg/dL (ref 0.44–1.00)
GFR calc Af Amer: >60 mL/min (ref >60)
GFR calc non Af Amer: >60 mL/min (ref >60)
Glucose, Bld: 98 mg/dL (ref 65–99)
Potassium: 3.5 mmol/L (ref 3.5–5.1)
Sodium: 138 mmol/L (ref 135–145)

## 2016-11-17 MED ORDER — CIPROFLOXACIN HCL 500 MG PO TABS: 500.0000 mg | ORAL_TABLET | Freq: Two times a day (BID) | ORAL | 0 refills | Status: AC

## 2016-11-17 NOTE — Discharge Summary (Signed)
Physician Discharge Summary  Patient ID: Sharon [name] MRN: [id number] DOB/AGE: Aug 29, 1960 56 y.o.  Admit date: 11/14/2016 Discharge date: 11/17/2016  Admission Diagnoses:1. Sepsis 2. Pyelonephritis 3. Renal calculi  Discharge Diagnoses: 1. Sepsis next line 2. Pyelonephritis 3. Renal calculi Principal Problem:   Sepsis ([place]) Active Problems:   Hydronephrosis   GERD (gastroesophageal reflux disease)   UTI (urinary tract infection)   Discharged Condition: stable  Hospital Course: 1. Sepsis. Secondary to pyelonephritis. Patient was given supportive care and treatment of underlying calls and sepsis resolved.  2.Pyelonephritis. Likely the result of a kidney stone. CT scan did not show any further evidence of stone. Patient has apparently passed the stone. She was treated with IV Rocephin. Cultures did not reveal anything. She's changed over to by mouth Cipro since she was allergic to Bactrim. 2. Renal calculi. Patient has a history of having stones. Suspect she passed the stone earlier. CT does not show any evidence of stone currently. She will follow-up with her urologist in 1 week.  Total time spent 35 minutes  Consults: urology  Significant Diagnostic Studies: radiology: CT scan: Left hydronephrosis  Treatments: antibiotics: ceftriaxone  Discharge Exam: Blood pressure 107/65, pulse 67, temperature 98.4 F (36.9 C), temperature source Oral, resp. rate 17, height 5\' 7"  (1.702 m), weight 91.2 kg (201 lb), SpO2 96 %. General appearance: no distress GI: Mild left flank tenderness. Much improved from previous  Disposition: 01-Home or Self Care  Discharge Instructions    Diet - low sodium heart healthy    Complete by:  As directed    Increase activity slowly    Complete by:  As directed      Allergies as of 11/17/2016      Reactions   Alcohol Other (See Comments)   wool   Bacitracin-neomycin-polymyxin Other (See Comments)   Codeine Nausea Only, Other (See Comments)    "headache"   Hydrocodone Other (See Comments)   Hydrocortisone Other (See Comments)   Lanolin Other (See Comments)   "rash"   Nickel Other (See Comments)   Other Other (See Comments)   Bitartrate/black rubber dye/elastic   Tape Other (See Comments)   Tramadol Other (See Comments)   "headache"   Bacitracin Rash   Clarithromycin Rash   Latex Rash   Oxycodone Rash   Penicillins Rash   Has patient had a PCN reaction causing immediate rash, facial/tongue/throat swelling, SOB or lightheadedness with hypotension: yes Has patient had a PCN reaction causing severe rash involving mucus membranes or skin necrosis: no Has patient had a PCN reaction that required hospitalization No Has patient had a PCN reaction occurring within the last 10 years: No If all of the above answers are "NO", then may proceed with Cephalosporin use.   Sulfamethoxazole-trimethoprim Rash      Medication List    TAKE these medications   ciprofloxacin 500 MG tablet Commonly known as:  CIPRO Take 1 tablet (500 mg total) by mouth 2 (two) times daily.   EPIPEN 2-PAK 0.3 mg/0.3 mL Soaj injection Generic drug:  EPINEPHrine Inject 0.3 mg into the muscle as needed. For anaphylaxis   levocetirizine 5 MG tablet Commonly known as:  XYZAL Take 5 mg by mouth every evening.   misoprostol 200 MCG tablet Commonly known as:  CYTOTEC Take 200 mcg by mouth 4 (four) times daily.   montelukast 10 MG tablet Commonly known as:  SINGULAIR Take 10 mg by mouth at bedtime.   pantoprazole 40 MG tablet Commonly known as:  PROTONIX Take  40 mg by mouth 2 (two) times daily.   sucralfate 1 GM/10ML suspension Commonly known as:  CARAFATE Take 10 mLs by mouth 4 (four) times daily -  before meals and at bedtime.      Follow-up Information    Bruning, Ashlyn, PA-C Follow up in 1 week(s).   Specialty:  Urology Contact information: [place] [address] [phone]           Signed: Baxter Hire 11/17/2016, 11:06 AM

## 2016-11-17 NOTE — Plan of Care (Signed)
Problem: Urinary Elimination: Goal: Signs and symptoms of infection will decrease Outcome: Completed/Met Date Met: 11/17/16 Pt has met all goals for discharge.

## 2016-11-17 NOTE — Progress Notes (Signed)
Shift assessment completed at 0740. Pt resting in dark room at that tim with ice pack to her neck, stated she had a headache, received tylenol po. Pt is on room air, lungs clear bilat, HR regular and pt has telebox in place. Abdomen is soft, bs heard. Pt is oob to commode prn, voided clear yellow urine directly after assessment completed. Ppp, no edema noted. piv to rac and l fa are intact with sites free of redness and swelling. Pt denied flank pain, stated overall she feels better. Since assessment, pt was rounded on by doctor, d/c orders given. This Probation officer removed piv's with catheters intact, pt tolerated well. Pt received dc/ instructions including directions regarding po cipro to begin, signed, and received copy. Pt transported to front entrance of facility via wc at 1200, waiting family car.

## 2016-11-18 ENCOUNTER — Ambulatory Visit
Admission: EM | Admit: 2016-11-18 | Discharge: 2016-11-18 | Disposition: A | Discharge status: Home / Self Care | Payer: BLUE CROSS/BLUE SHIELD | Attending: Family Medicine | Admitting: Family Medicine

## 2016-11-18 DIAGNOSIS — I809 Phlebitis and thrombophlebitis of unspecified site: Secondary | ICD-10-CM | POA: Diagnosis not present

## 2016-11-18 DIAGNOSIS — R202 Paresthesia of skin: Secondary | ICD-10-CM

## 2016-11-18 DIAGNOSIS — R200 Anesthesia of skin: Secondary | ICD-10-CM

## 2016-11-18 MED ORDER — PREDNISONE 10 MG (21) PO TBPK: ORAL_TABLET | ORAL | 0 refills | Status: DC

## 2016-11-18 NOTE — ED Provider Notes (Addendum)
[facility]    CSN: [id number] Arrival date & time: 11/18/16  1529     History   Chief Complaint Chief Complaint  Patient presents with  . Numbness    HPI Sharon [name] is a 56 y.o. female.   Patient is a 56 year old white female who recently (yesterday) got on the hospital after being admitted last Friday for sepsis. She states is his second time she's had sepsis from kidney stones and had to be admitted. She states they put back up lung in her arm in case the main IV antibiotics collapsed and they want to make sure she didn't miss any of her antibiotics. She reports that the line was in her right forearm that she started having pain in the right forearm on Saturday. She states it would not pull the line out from toes Monday before she left she's had numbness and pain in the right forearm and tingling mass. She is concerned that she's had phlebitis in her lower extremities before. She was on blood thinners until March of this past year. She reports numbness and pain in the right forearm. Past medical history multiple medical problems recurrent urinary stones headaches depression bladder spasms and DVTs. Abdominal hysterectomy bariatric surgery cholecystectomy kidney stone surgery shoulder arthroscopy and sinus explorations. Mother with cancer and father with lung cancer. She never smoked multiple drug allergies such as alcohol, neomycin, amoxicillin ,oxycodone, penicillin, Septra and tramadol.   The history is provided by the patient. No language interpreter was used.    Past Medical History:  Diagnosis Date  . Acid reflux   . Anxiety   . Arthritis   . Bladder spasm   . Depression   . Headache   . Kidney calculi   . Left ureteral stone   . Leg DVT (deep venous thromboembolism), acute ([place])   . Restless leg syndrome   . Sleep apnea    does not have C-PAP @ present  . Urge incontinence     Patient Active Problem List   Diagnosis Date Noted  . Sepsis ([place])  11/15/2016  . Hydronephrosis 11/15/2016  . GERD (gastroesophageal reflux disease) 11/15/2016  . UTI (urinary tract infection) 11/15/2016  . Right leg DVT ([place]) 01/24/2016  . Kidney stones 09/10/2015  . Urge incontinence 09/10/2015  . Nocturia 09/10/2015    Past Surgical History:  Procedure Laterality Date  . ABDOMINAL HYSTERECTOMY     TOTAL ABDOMINAL HYSTERECTOMY W/ BILATERAL SALPINGOOPHORECTOMY  . BARIATRIC SURGERY  05/15/2016  . CHOLECYSTECTOMY    . ESOPHAGOGASTRODUODENOSCOPY  05/15/2016  . KIDNEY STONE SURGERY    . KNEE ARTHROSCOPY Right    X 2  . SHOULDER ARTHROSCOPY WITH OPEN ROTATOR CUFF REPAIR Right 11/22/2015   Procedure: SHOULDER ARTHROSCOPY, DEBRIDEMENT, DECOMPRESSION, SLAP REPAIR, POSSIBLE BICEP TENODESIS;  Surgeon: Corky Mull, MD;  Location: [facility] ORS;  Service: Orthopedics;  Laterality: Right;  . SINUS EXPLORATION      OB History    No data available       Home Medications    Prior to Admission medications   Medication Sig Start Date End Date Taking? Authorizing Provider  ciprofloxacin (CIPRO) 500 MG tablet Take 1 tablet (500 mg total) by mouth 2 (two) times daily. 11/17/16 11/24/16 Yes Baxter Hire, MD  EPINEPHrine (EPIPEN 2-PAK) 0.3 mg/0.3 mL IJ SOAJ injection Inject 0.3 mg into the muscle as needed. For anaphylaxis   Yes [provider]  levocetirizine (XYZAL) 5 MG tablet Take 5 mg by mouth every evening.  Yes [provider]  misoprostol (CYTOTEC) 200 MCG tablet Take 200 mcg by mouth 4 (four) times daily. 10/08/16  Yes [provider]  montelukast (SINGULAIR) 10 MG tablet Take 10 mg by mouth at bedtime.   Yes [provider]  pantoprazole (PROTONIX) 40 MG tablet Take 40 mg by mouth 2 (two) times daily. 10/08/16  Yes [provider]  sucralfate (CARAFATE) 1 GM/10ML suspension Take 10 mLs by mouth 4 (four) times daily -  before meals and at bedtime. 10/08/16 01/06/17 Yes [provider]  predniSONE (STERAPRED  UNI-PAK 21 TAB) 10 MG (21) TBPK tablet Sig 6 tablet day 1, 5 tablets day 2, 4 tablets day 3,,3tablets day 4, 2 tablets day 5, 1 tablet day 6 take all tablets orally 11/18/16   Frederich Cha, MD    Family History Family History  Problem Relation Age of Onset  . Cancer Mother   . Lung cancer Father   . Hematuria Brother   . Kidney disease Paternal Grandfather   . Kidney Stones Brother   . Bladder Cancer Neg Hx     Social History Social History  Substance Use Topics  . Smoking status: Never Smoker  . Smokeless tobacco: Never Used  . Alcohol use No     Allergies   Alcohol; Bacitracin-neomycin-polymyxin; Codeine; Hydrocodone; Hydrocortisone; Lanolin; Nickel; Other; Tape; Tramadol; Bacitracin; Clarithromycin; Latex; Oxycodone; Penicillins; and Sulfamethoxazole-trimethoprim   Review of Systems Review of Systems  Constitutional: Negative for activity change.  HENT: Negative for congestion.   Cardiovascular: Negative for chest pain.  Gastrointestinal: Negative for abdominal distention.  Skin: Negative for color change.  All other systems reviewed and are negative.    Physical Exam Triage Vital Signs ED Triage Vitals  Enc Vitals Group     BP 11/18/16 1540 108/67     Pulse Rate 11/18/16 1540 80     Resp 11/18/16 1540 18     Temp 11/18/16 1540 98.4 F (36.9 C)     Temp Source 11/18/16 1540 Oral     SpO2 11/18/16 1540 97 %     Weight 11/18/16 1539 201 lb (91.2 kg)     Height --      Head Circumference --      Peak Flow --      Pain Score 11/18/16 1539 6     Pain Loc --      Pain Edu? --      Excl. in [place]? --    No data found.   Updated Vital Signs BP 108/67 (BP Location: Left Arm)   Pulse 80   Temp 98.4 F (36.9 C) (Oral)   Resp 18   Wt 201 lb (91.2 kg)   SpO2 97%   BMI 31.48 kg/m   Visual Acuity Right Eye Distance:   Left Eye Distance:   Bilateral Distance:    Right Eye Near:   Left Eye Near:    Bilateral Near:     Physical Exam  Constitutional: She  is oriented to person, place, and time. She appears well-developed and well-nourished.  HENT:  Head: Normocephalic and atraumatic.  Eyes: EOM are normal. Pupils are equal, round, and reactive to light.  Neck: Normal range of motion. Neck supple. No thyromegaly present.  Pulmonary/Chest: Effort normal.  Musculoskeletal: Normal range of motion. She exhibits tenderness. She exhibits no edema.  Neurological: She is alert and oriented to person, place, and time. No cranial nerve deficit.  Skin: Skin is warm.  Psychiatric: She has a normal mood  and affect.  Vitals reviewed.    UC Treatments / Results  Labs (all labs ordered are listed, but only abnormal results are displayed) Labs Reviewed - No data to display  EKG  EKG Interpretation None       Radiology No results found.  Procedures Procedures (including critical care time)  Medications Ordered in UC Medications - No data to display   Initial Impression / Assessment and Plan / UC Course  I have reviewed the triage vital signs and the nursing notes.  Pertinent labs & imaging results that were available during my care of the patient were reviewed by me and considered in my medical decision making (see chart for details).   patient is currently right forearm  Patient apparently has phlebitis right forearm another possibility is she could just bled into the soft tissue in the right forearm or she may have had some type of nerve impingement. Offered her Ultrasound of her right forearm she is reluctant to have that done now. I have explained even if ths is phlebitis I don't think that we have to be concerned about deep vein thrombosis. 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  Final Clinical Impressions(s) / UC Diagnoses   Final diagnoses:  Right arm numbness  Phlebitis    New Prescriptions New Prescriptions    PREDNISONE (STERAPRED UNI-PAK 21 TAB) 10 MG (21) TBPK TABLET    Sig 6 tablet day 1, 5 tablets day 2, 4 tablets day 3,,3tablets day 4, 2 tablets day 5, 1 tablet day 6 take all tablets orally    Note: This dictation was prepared with Dragon dictation along with smaller phrase technology. Any transcriptional errors that result from this process are unintentional.    Frederich Cha, MD 11/18/16 [place]    Frederich Cha, MD 11/18/16 1710

## 2016-11-18 NOTE — ED Triage Notes (Signed)
Patient states that she was recently hospitalized for sepsis and discharged yesterday. Patient states that she had an IV placed in her right arm and complained to nursing staff that area was hurting and they did not remove. Patient states that she has been noticing pain that radiates from right antecubital space down to finger tips.

## 2016-11-19 LAB — CULTURE, BLOOD (ROUTINE X 2)
Culture: NO GROWTH
Culture: NO GROWTH
Special Requests: ADEQUATE

## 2016-11-24 ENCOUNTER — Telehealth: Payer: Self-pay | Admitting: Radiology

## 2016-11-24 NOTE — Telephone Encounter (Signed)
Pt states she was seen in the ER for pyelonephritis & began having "lymph node pain" in her underarms as soon as she finished taking her antibiotics. Moved pt's appt up to 11/26/16 & per Dr Louis Meckel advised pt to see her PCP. Pt stated she will just wait until the appt in our office.

## 2016-11-26 ENCOUNTER — Encounter: Payer: Self-pay | Admitting: Urology

## 2016-11-26 ENCOUNTER — Ambulatory Visit (INDEPENDENT_AMBULATORY_CARE_PROVIDER_SITE_OTHER): Payer: BLUE CROSS/BLUE SHIELD | Admitting: Urology

## 2016-11-26 VITALS — BP 107/74 | HR 76 | Ht 67.0 in | Wt 191.3 lb

## 2016-11-26 DIAGNOSIS — N12 Tubulo-interstitial nephritis, not specified as acute or chronic: Secondary | ICD-10-CM | POA: Diagnosis not present

## 2016-11-26 LAB — URINALYSIS, COMPLETE
Bilirubin, UA: NEGATIVE
Glucose, UA: NEGATIVE
Ketones, UA: NEGATIVE
Nitrite, UA: NEGATIVE
Protein, UA: NEGATIVE
RBC, UA: NEGATIVE
Specific Gravity, UA: 1.02 (ref 1.005–1.030)
Urobilinogen, Ur: 0.2 mg/dL (ref 0.2–1.0)
pH, UA: 6 (ref 5.0–7.5)

## 2016-11-26 LAB — MICROSCOPIC EXAMINATION

## 2016-11-26 MED ORDER — FLUCONAZOLE 150 MG PO TABS: 150.0000 mg | ORAL_TABLET | Freq: Once | ORAL | 0 refills | Status: AC

## 2016-11-26 NOTE — Progress Notes (Signed)
11/26/2016 12:11 PM   Sharon [name] 06/06/1960 [id number]  Referring provider: Ricardo Jericho, NP [address] [place], [place] [address]  Chief Complaint  Patient presents with  . Follow-up    from Hospital pyelonephritis    HPI: The patient is a 56 year old female who was recently admitted to the hospital for sepsis secondary to pyelonephritis presents today for follow-up. The patient did have a CT scan that showed mild hydroureteronephrosis at the time of admission on the left side suggesting recent passage of a ureteral stone. 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 She has no stones seen on that CT scan.  Her urine culture grew mixed flora and her blood cultures were negative. She was discharged home on Cipro. Her urinalysis today is unremarkable. She still feels somewhat weak but overall she is feeling better. She is no other complaints at this time other than the antibiotics have given her [place] infection. This is common for her when she takes antibiotics. For use infection usually responds well to 1 dose of Diflucan.   PMH: Past Medical History:  Diagnosis Date  . Acid reflux   . Anxiety   . Arthritis   . Bladder spasm   . Depression   . Headache   . Kidney calculi   . Left ureteral stone   . Leg DVT (deep venous thromboembolism), acute ([place])   . Restless leg syndrome   . Sleep apnea    does not have C-PAP @ present  . Urge incontinence     Surgical History: Past Surgical History:  Procedure Laterality Date  . ABDOMINAL HYSTERECTOMY     TOTAL ABDOMINAL HYSTERECTOMY W/ BILATERAL SALPINGOOPHORECTOMY  . BARIATRIC SURGERY  05/15/2016  . CHOLECYSTECTOMY    . ESOPHAGOGASTRODUODENOSCOPY  05/15/2016  . KIDNEY STONE SURGERY    . KNEE ARTHROSCOPY Right    X 2  . KNEE SURGERY Left 2006  . SHOULDER ARTHROSCOPY WITH OPEN ROTATOR CUFF REPAIR Right 11/22/2015   Procedure: SHOULDER ARTHROSCOPY, DEBRIDEMENT, DECOMPRESSION, SLAP REPAIR,  POSSIBLE BICEP TENODESIS;  Surgeon: Corky Mull, MD;  Location: [facility] ORS;  Service: Orthopedics;  Laterality: Right;  . SINUS EXPLORATION      Home Medications:  Allergies as of 11/26/2016      Reactions   Alcohol Other (See Comments)   wool   Bacitracin-neomycin-polymyxin Other (See Comments)   Codeine Nausea Only, Other (See Comments)   "headache"   Hydrocodone Other (See Comments)   Hydrocortisone Other (See Comments)   Lanolin Other (See Comments)   "rash"   Nickel Other (See Comments)   Other Other (See Comments)   Bitartrate/black rubber dye/elastic   Tape Other (See Comments)   Tramadol Other (See Comments)   "headache"   Bacitracin Rash   Clarithromycin Rash   Latex Rash   Oxycodone Rash   Penicillins Rash   Has patient had a PCN reaction causing immediate rash, facial/tongue/throat swelling, SOB or lightheadedness with hypotension: yes Has patient had a PCN reaction causing severe rash involving mucus membranes or skin necrosis: no Has patient had a PCN reaction that required hospitalization No Has patient had a PCN reaction occurring within the last 10 years: No If all of the above answers are "NO", then may proceed with Cephalosporin use.   Sulfamethoxazole-trimethoprim Rash      Medication List       Accurate as of 11/26/16 12:11 PM. Always use your most recent med list.  apixaban 5 MG Tabs tablet Commonly known as:  ELIQUIS Take by mouth.   ciprofloxacin 250 MG tablet Commonly known as:  CIPRO Take by mouth.   vitamin B-12 1000 MCG tablet Commonly known as:  CYANOCOBALAMIN Take by mouth.   Cyanocobalamin 500 MCG/0.1ML Soln by Nasal route.   EPIPEN 2-PAK 0.3 mg/0.3 mL Soaj injection Generic drug:  EPINEPHrine Inject 0.3 mg into the muscle as needed. For anaphylaxis   fluconazole 150 MG tablet Commonly known as:  DIFLUCAN Take 1 tablet (150 mg total) by mouth once.   fluticasone 50 MCG/ACT nasal spray Commonly known as:  FLONASE by  Nasal route.   levocetirizine 5 MG tablet Commonly known as:  XYZAL Take 5 mg by mouth every evening.   misoprostol 200 MCG tablet Commonly known as:  CYTOTEC Take 200 mcg by mouth 4 (four) times daily.   montelukast 10 MG tablet Commonly known as:  SINGULAIR Take 10 mg by mouth at bedtime.   multivitamin tablet Take 1 tablet by mouth daily.   pantoprazole 40 MG tablet Commonly known as:  PROTONIX Take 40 mg by mouth 2 (two) times daily.   predniSONE 10 MG (21) Tbpk tablet Commonly known as:  STERAPRED UNI-PAK 21 TAB Sig 6 tablet day 1, 5 tablets day 2, 4 tablets day 3,,3tablets day 4, 2 tablets day 5, 1 tablet day 6 take all tablets orally   sucralfate 1 GM/10ML suspension Commonly known as:  CARAFATE Take 10 mLs by mouth 4 (four) times daily -  before meals and at bedtime.       Allergies:  Allergies  Allergen Reactions  . Alcohol Other (See Comments)    wool  . Bacitracin-Neomycin-Polymyxin Other (See Comments)  . Codeine Nausea Only and Other (See Comments)    "headache"  . Hydrocodone Other (See Comments)  . Hydrocortisone Other (See Comments)  . Lanolin Other (See Comments)    "rash"  . Nickel Other (See Comments)  . Other Other (See Comments)    Bitartrate/black rubber dye/elastic  . Tape Other (See Comments)  . Tramadol Other (See Comments)    "headache"  . Bacitracin Rash  . Clarithromycin Rash  . Latex Rash  . Oxycodone Rash  . Penicillins Rash    Has patient had a PCN reaction causing immediate rash, facial/tongue/throat swelling, SOB or lightheadedness with hypotension: yes Has patient had a PCN reaction causing severe rash involving mucus membranes or skin necrosis: no Has patient had a PCN reaction that required hospitalization No Has patient had a PCN reaction occurring within the last 10 years: No If all of the above answers are "NO", then may proceed with Cephalosporin use.   . Sulfamethoxazole-Trimethoprim Rash    Family  History: Family History  Problem Relation Age of Onset  . Cancer Mother   . Lung cancer Father   . Hematuria Brother   . Kidney disease Paternal Grandfather   . Kidney Stones Brother   . Bladder Cancer Neg Hx   . Kidney cancer Neg Hx     Social History:  reports that she has never smoked. She has never used smokeless tobacco. She reports that she does not drink alcohol or use drugs.  ROS:                                        Physical Exam: BP 107/74   Pulse 76   Ht 5'  7" (1.702 m)   Wt 191 lb 4.8 oz (86.8 kg)   BMI 29.96 kg/m   Constitutional:  Alert and oriented, No acute distress. HEENT: [place] AT, moist mucus membranes.  Trachea midline, no masses. Cardiovascular: No clubbing, cyanosis, or edema. Respiratory: Normal respiratory effort, no increased work of breathing. GI: Abdomen is soft, nontender, nondistended, no abdominal masses GU: No CVA tenderness.  Skin: No rashes, bruises or suspicious lesions. Lymph: No cervical or inguinal adenopathy. Neurologic: Grossly intact, no focal deficits, moving all 4 extremities. Psychiatric: Normal mood and affect.  Laboratory Data: Lab Results  Component Value Date   WBC 10.8 11/15/2016   HGB 11.7 (L) 11/15/2016   HCT 34.5 (L) 11/15/2016   MCV 81.7 11/15/2016   PLT 175 11/15/2016    Lab Results  Component Value Date   CREATININE 0.45 11/17/2016    No results found for: PSA  No results found for: TESTOSTERONE  No results found for: HGBA1C  Urinalysis    Component Value Date/Time   COLORURINE STRAW (A) 11/14/2016 2247   APPEARANCEUR CLEAR (A) 11/14/2016 2247   APPEARANCEUR Hazy (A) 09/06/2015 0935   LABSPEC 1.003 (L) 11/14/2016 2247   LABSPEC 1.021 06/20/2014 [place] 7.0 11/14/2016 [place] 11/14/2016 2247   GLUCOSEU Negative 06/20/2014 [place] 11/14/2016 [place] 11/14/2016 2247   BILIRUBINUR Negative 09/06/2015 0935   BILIRUBINUR  Negative 06/20/2014 1535   KETONESUR 5 (A) 11/14/2016 2247   PROTEINUR NEGATIVE 11/14/2016 2247   NITRITE NEGATIVE 11/14/2016 2247   LEUKOCYTESUR TRACE (A) 11/14/2016 2247   LEUKOCYTESUR 2+ (A) 09/06/2015 0935   LEUKOCYTESUR 3+ 06/20/2014 1535    Pertinent Imaging: CT images reviewed as above.  Assessment & Plan:    1. Pyelonephritis The patient is doing well after being discharged the hospital for sepsis and pyelonephritis likely secondary to a recently passed stone. At this time, she is stone free. We will send him 1 dose of Diflucan for her yeast infection at this time. She'll follow-up with [place] as needed.  Return if symptoms worsen or fail to improve.  Nickie Retort, MD  [facility] [address], [place], [place] [address] [phone]

## 2016-12-01 ENCOUNTER — Ambulatory Visit: Payer: BLUE CROSS/BLUE SHIELD

## 2017-03-24 DIAGNOSIS — Z9884 Bariatric surgery status: Secondary | ICD-10-CM | POA: Insufficient documentation

## 2017-03-24 DIAGNOSIS — K289 Gastrojejunal ulcer, unspecified as acute or chronic, without hemorrhage or perforation: Secondary | ICD-10-CM | POA: Insufficient documentation

## 2017-04-13 ENCOUNTER — Encounter: Payer: Self-pay | Admitting: Emergency Medicine

## 2017-04-13 ENCOUNTER — Emergency Department
Admission: EM | Admit: 2017-04-13 | Discharge: 2017-04-13 | Disposition: A | Discharge status: Home / Self Care | Payer: BLUE CROSS/BLUE SHIELD | Attending: Student in an Organized Health Care Education/Training Program | Admitting: Student in an Organized Health Care Education/Training Program

## 2017-04-13 ENCOUNTER — Other Ambulatory Visit: Payer: Self-pay

## 2017-04-13 ENCOUNTER — Emergency Department: Payer: BLUE CROSS/BLUE SHIELD

## 2017-04-13 DIAGNOSIS — F329 Major depressive disorder, single episode, unspecified: Secondary | ICD-10-CM | POA: Diagnosis not present

## 2017-04-13 DIAGNOSIS — S8001XA Contusion of right knee, initial encounter: Secondary | ICD-10-CM | POA: Diagnosis not present

## 2017-04-13 DIAGNOSIS — S39012A Strain of muscle, fascia and tendon of lower back, initial encounter: Secondary | ICD-10-CM | POA: Insufficient documentation

## 2017-04-13 DIAGNOSIS — Y998 Other external cause status: Secondary | ICD-10-CM | POA: Insufficient documentation

## 2017-04-13 DIAGNOSIS — S29012A Strain of muscle and tendon of back wall of thorax, initial encounter: Secondary | ICD-10-CM | POA: Diagnosis not present

## 2017-04-13 DIAGNOSIS — Z79899 Other long term (current) drug therapy: Secondary | ICD-10-CM | POA: Insufficient documentation

## 2017-04-13 DIAGNOSIS — Y9389 Activity, other specified: Secondary | ICD-10-CM | POA: Insufficient documentation

## 2017-04-13 DIAGNOSIS — Z9049 Acquired absence of other specified parts of digestive tract: Secondary | ICD-10-CM | POA: Insufficient documentation

## 2017-04-13 DIAGNOSIS — Z7901 Long term (current) use of anticoagulants: Secondary | ICD-10-CM | POA: Insufficient documentation

## 2017-04-13 DIAGNOSIS — Z9884 Bariatric surgery status: Secondary | ICD-10-CM | POA: Diagnosis not present

## 2017-04-13 DIAGNOSIS — S29019A Strain of muscle and tendon of unspecified wall of thorax, initial encounter: Secondary | ICD-10-CM

## 2017-04-13 DIAGNOSIS — F419 Anxiety disorder, unspecified: Secondary | ICD-10-CM | POA: Diagnosis not present

## 2017-04-13 DIAGNOSIS — Y92410 Unspecified street and highway as the place of occurrence of the external cause: Secondary | ICD-10-CM | POA: Insufficient documentation

## 2017-04-13 DIAGNOSIS — S8992XA Unspecified injury of left lower leg, initial encounter: Secondary | ICD-10-CM | POA: Diagnosis present

## 2017-04-13 DIAGNOSIS — Z91040 Latex allergy status: Secondary | ICD-10-CM | POA: Insufficient documentation

## 2017-04-13 MED ORDER — DIAZEPAM 2 MG PO TABS: 2.0000 mg | ORAL_TABLET | Freq: Three times a day (TID) | ORAL | 0 refills | Status: DC | PRN
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
## 2017-04-13 NOTE — Discharge Instructions (Signed)
Follow-up with doctor of your choice or [facility] if any continued problems. Begin taking Tylenol as needed for muscle pain. You  may also take diazepam 2 mg one every 8 hours as needed for muscle spasms. Use moist heat or use warm shower as needed for muscle soreness. You may experience muscle soreness for the next 4-5 days.

## 2017-04-24 ENCOUNTER — Other Ambulatory Visit: Payer: Self-pay

## 2017-04-24 ENCOUNTER — Ambulatory Visit
Admission: EM | Admit: 2017-04-24 | Discharge: 2017-04-24 | Disposition: A | Discharge status: Home / Self Care | Payer: BLUE CROSS/BLUE SHIELD | Attending: Family Medicine | Admitting: Family Medicine

## 2017-04-24 ENCOUNTER — Encounter: Payer: Self-pay | Admitting: Emergency Medicine

## 2017-04-24 DIAGNOSIS — B9789 Other viral agents as the cause of diseases classified elsewhere: Secondary | ICD-10-CM

## 2017-04-24 DIAGNOSIS — J069 Acute upper respiratory infection, unspecified: Secondary | ICD-10-CM | POA: Diagnosis not present

## 2017-04-24 LAB — RAPID STREP SCREEN (MED CTR MEBANE ONLY): Streptococcus, Group A Screen (Direct): NEGATIVE

## 2017-04-24 NOTE — ED Provider Notes (Signed)
[facility]    CSN: [id number] Arrival date & time: 04/24/17  2595     History   Chief Complaint Chief Complaint  Patient presents with  . Nasal Congestion  . Sore Throat    HPI Sharon [name] is a 56 y.o. female.   The history is provided by the patient.  Sore Throat   URI  Presenting symptoms: congestion, cough, rhinorrhea and sore throat   Severity:  Moderate Onset quality:  Sudden Timing:  Constant Progression:  Worsening Chronicity:  New Relieved by:  None tried Ineffective treatments:  None tried Associated symptoms: no sinus pain and no wheezing   Risk factors: sick contacts   Risk factors: not elderly, no chronic cardiac disease, no chronic kidney disease, no chronic respiratory disease, no diabetes mellitus, no immunosuppression, no recent illness and no recent travel     Past Medical History:  Diagnosis Date  . Acid reflux   . Anxiety   . Arthritis   . Bladder spasm   . Depression   . Headache   . Kidney calculi   . Left ureteral stone   . Leg DVT (deep venous thromboembolism), acute ([place])   . Restless leg syndrome   . Sleep apnea    does not have C-PAP @ present  . Urge incontinence     Patient Active Problem List   Diagnosis Date Noted  . Sepsis ([place]) 11/15/2016  . Hydronephrosis 11/15/2016  . GERD (gastroesophageal reflux disease) 11/15/2016  . UTI (urinary tract infection) 11/15/2016  . Right leg DVT ([place]) 01/24/2016  . Kidney stones 09/10/2015  . Urge incontinence 09/10/2015  . Nocturia 09/10/2015    Past Surgical History:  Procedure Laterality Date  . ABDOMINAL HYSTERECTOMY     TOTAL ABDOMINAL HYSTERECTOMY W/ BILATERAL SALPINGOOPHORECTOMY  . BARIATRIC SURGERY  05/15/2016  . CHOLECYSTECTOMY    . ESOPHAGOGASTRODUODENOSCOPY  05/15/2016  . KIDNEY STONE SURGERY    . KNEE ARTHROSCOPY Right    X 2  . KNEE SURGERY Left 2006  . SHOULDER ARTHROSCOPY WITH OPEN ROTATOR CUFF REPAIR Right 11/22/2015   Procedure: SHOULDER  ARTHROSCOPY, DEBRIDEMENT, DECOMPRESSION, SLAP REPAIR, POSSIBLE BICEP TENODESIS;  Surgeon: Corky Mull, MD;  Location: [facility] ORS;  Service: Orthopedics;  Laterality: Right;  . SINUS EXPLORATION      OB History    No data available       Home Medications    Prior to Admission medications   Medication Sig Start Date End Date Taking? Authorizing Provider  Cyanocobalamin 500 MCG/0.1ML SOLN by Nasal route. 07/07/16  Yes [provider]  EPINEPHrine (EPIPEN 2-PAK) 0.3 mg/0.3 mL IJ SOAJ injection Inject 0.3 mg into the muscle as needed. For anaphylaxis   Yes [provider]  fluticasone (FLONASE) 50 MCG/ACT nasal spray by Nasal route. 07/05/16  Yes [provider]  levocetirizine (XYZAL) 5 MG tablet Take 5 mg by mouth every evening.   Yes [provider]  misoprostol (CYTOTEC) 200 MCG tablet Take 200 mcg by mouth 4 (four) times daily. 10/08/16  Yes [provider]  montelukast (SINGULAIR) 10 MG tablet Take 10 mg by mouth at bedtime.   Yes [provider]  Multiple Vitamin (MULTIVITAMIN) tablet Take 1 tablet by mouth daily.   Yes [provider]  pantoprazole (PROTONIX) 40 MG tablet Take 40 mg by mouth 2 (two) times daily. 10/08/16  Yes [provider]  sucralfate (CARAFATE) 1 GM/10ML suspension Take 10 mLs by mouth 4 (four) times daily -  before  meals and at bedtime. 10/08/16 04/24/17 Yes [provider]  vitamin B-12 (CYANOCOBALAMIN) 1000 MCG tablet Take by mouth.   Yes [provider]  apixaban (ELIQUIS) 5 MG TABS tablet Take by mouth. 07/15/16   [provider]  ciprofloxacin (CIPRO) 250 MG tablet Take by mouth.    [provider]  diazepam (VALIUM) 2 MG tablet Take 1 tablet (2 mg total) by mouth every 8 (eight) hours as needed for muscle spasms. 04/13/17   Johnn Hai, PA-C    Family History Family History  Problem Relation Age of Onset  . Cancer Mother   . Lung cancer Father   .  Hematuria Brother   . Kidney disease Paternal Grandfather   . Kidney Stones Brother   . Bladder Cancer Neg Hx   . Kidney cancer Neg Hx     Social History Social History   Tobacco Use  . Smoking status: Never Smoker  . Smokeless tobacco: Never Used  Substance Use Topics  . Alcohol use: No    Alcohol/week: 0.0 oz  . Drug use: No     Allergies   Alcohol; Bacitracin-neomycin-polymyxin; Codeine; Hydrocodone; Hydrocortisone; Lanolin; Nickel; Other; Tape; Tramadol; Bacitracin; Clarithromycin; Latex; Oxycodone; Penicillins; and Sulfamethoxazole-trimethoprim   Review of Systems Review of Systems  HENT: Positive for congestion, rhinorrhea and sore throat. Negative for sinus pain.   Respiratory: Positive for cough. Negative for wheezing.      Physical Exam Triage Vital Signs ED Triage Vitals  Enc Vitals Group     BP 04/24/17 0854 111/65     Pulse Rate 04/24/17 0854 60     Resp 04/24/17 0854 14     Temp 04/24/17 0854 97.7 F (36.5 C)     Temp Source 04/24/17 0854 Oral     SpO2 04/24/17 0854 100 %     Weight 04/24/17 0849 178 lb (80.7 kg)     Height 04/24/17 0849 5\' 6"  (1.676 m)     Head Circumference --      Peak Flow --      Pain Score 04/24/17 0850 0     Pain Loc --      Pain Edu? --      Excl. in [place]? --    No data found.  Updated Vital Signs BP 111/65 (BP Location: Left Arm)   Pulse 60   Temp 97.7 F (36.5 C) (Oral)   Resp 14   Ht 5\' 6"  (1.676 m)   Wt 178 lb (80.7 kg)   SpO2 100%   BMI 28.73 kg/m   Visual Acuity Right Eye Distance:   Left Eye Distance:   Bilateral Distance:    Right Eye Near:   Left Eye Near:    Bilateral Near:     Physical Exam  Constitutional: She appears well-developed and well-nourished. No distress.  HENT:  Head: Normocephalic and atraumatic.  Right Ear: Tympanic membrane, external ear and ear canal normal.  Left Ear: Tympanic membrane, external ear and ear canal normal.  Nose: Rhinorrhea present. No mucosal edema, nose  lacerations, sinus tenderness, nasal deformity, septal deviation or nasal septal hematoma. No epistaxis.  No foreign bodies. Right sinus exhibits no maxillary sinus tenderness and no frontal sinus tenderness. Left sinus exhibits no maxillary sinus tenderness and no frontal sinus tenderness.  Mouth/Throat: Uvula is midline and mucous membranes are normal. Posterior oropharyngeal erythema present. No oropharyngeal exudate or posterior oropharyngeal edema. No tonsillar exudate.  Eyes: Conjunctivae and EOM are normal. Pupils are equal, round, and  reactive to light. Right eye exhibits no discharge. Left eye exhibits no discharge. No scleral icterus.  Neck: Normal range of motion. Neck supple. No thyromegaly present.  Cardiovascular: Normal rate, regular rhythm and normal heart sounds.  Pulmonary/Chest: Effort normal and breath sounds normal. No respiratory distress. She has no wheezes. She has no rales.  Lymphadenopathy:    She has no cervical adenopathy.  Skin: She is not diaphoretic.  Nursing note and vitals reviewed.    UC Treatments / Results  Labs (all labs ordered are listed, but only abnormal results are displayed) Labs Reviewed  RAPID STREP SCREEN (NOT AT [facility]  CULTURE, GROUP A STREP [facility])    EKG  EKG Interpretation None       Radiology No results found.  Procedures Procedures (including critical care time)  Medications Ordered in UC Medications - No data to display   Initial Impression / Assessment and Plan / UC Course  I have reviewed the triage vital signs and the nursing notes.  Pertinent labs & imaging results that were available during my care of the patient were reviewed by me and considered in my medical decision making (see chart for details).       Final Clinical Impressions(s) / UC Diagnoses   Final diagnoses:  Viral URI with cough    ED Discharge Orders    None     1. Lab result and diagnosis reviewed with patient 2. Recommend supportive  treatment with rest, increased fluids, otc analgesics 3. Follow-up prn if symptoms worsen or don't improve  Controlled Substance Prescriptions [place] Controlled Substance Registry consulted? Not Applicable   Norval Gable, MD 04/24/17 [phone]

## 2017-04-24 NOTE — ED Triage Notes (Signed)
Patient c/o nasal congestion, runny nose, and sore throat that started yesterday.  Patient denies fevers.

## 2017-04-26 LAB — CULTURE, GROUP A STREP (THRC)

## 2017-05-20 ENCOUNTER — Encounter: Payer: Self-pay | Admitting: Emergency Medicine

## 2017-05-20 ENCOUNTER — Ambulatory Visit
Admission: EM | Admit: 2017-05-20 | Discharge: 2017-05-20 | Disposition: A | Discharge status: Home / Self Care | Payer: BLUE CROSS/BLUE SHIELD | Attending: Family Medicine | Admitting: Family Medicine

## 2017-05-20 ENCOUNTER — Other Ambulatory Visit: Payer: Self-pay

## 2017-05-20 DIAGNOSIS — H1032 Unspecified acute conjunctivitis, left eye: Secondary | ICD-10-CM

## 2017-05-20 MED ORDER — MOXIFLOXACIN HCL 0.5 % OP SOLN: 1.0000 [drp] | Freq: Three times a day (TID) | OPHTHALMIC | 0 refills | Status: AC

## 2017-05-20 NOTE — Discharge Instructions (Signed)
Take medication as prescribed. Good hand hygiene.  Follow up with your primary care physician or eye doctor this week as needed. Return to Urgent care for new or worsening concerns.

## 2017-05-20 NOTE — ED Triage Notes (Signed)
Patient c/o redness, tenderness and drainage from her left eye that started yesterday.

## 2017-05-20 NOTE — ED Provider Notes (Signed)
[facility] ____________________________________________  Time seen: Approximately 8:44 AM  I have reviewed the triage vital signs and the nursing notes.   HISTORY  Chief Complaint Eye Problem   HPI Sharon [name] is a 57 y.o. female presenting for evaluation of left eye redness, itching, irritation and drainage that started yesterday evening.  States yesterday evening noticed left eye was a little itchy and somewhat irritated the painful.  States last night woke up in the middle of the night with her left being matted shut with yellow drainage.  States left eye is continued to remain irritated, itchy and some drainage.  Patient reports that she has a history of pinkeye with same presentation.  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  Denies foreign body sensation, chemical exposure, abrupt onset, trauma or other complaints.  States does not wear contacts.  States has glasses ordered from her eye doctor but have not yet arrived.  Denies any changes in her vision, light sensitivity, fevers, cough, congestion.  States does have chronic seasonal allergies, denies change in her baseline allergies.  Denies right eye complaints.  Reports otherwise feels well.  No over-the-counter medications taken for the same complaint.  Denies other aggravating or alleviating factors. Denies recent sickness. Denies recent antibiotic use.   White, Orlene Och, NP: PCP Opth: in [place]    Past Medical History:  Diagnosis Date  . Acid reflux   . Anxiety   . Arthritis   . Bladder spasm   . Depression   . Headache   . Kidney calculi   . Left ureteral stone   . Leg DVT (deep venous thromboembolism), acute ([place])   . Restless leg syndrome   . Sleep apnea    does not have C-PAP @ present  . Urge incontinence     Patient Active Problem List   Diagnosis Date Noted  . Sepsis ([place]) 11/15/2016  . Hydronephrosis 11/15/2016  . GERD (gastroesophageal reflux  disease) 11/15/2016  . UTI (urinary tract infection) 11/15/2016  . Right leg DVT ([place]) 01/24/2016  . Kidney stones 09/10/2015  . Urge incontinence 09/10/2015  . Nocturia 09/10/2015    Past Surgical History:  Procedure Laterality Date  . ABDOMINAL HYSTERECTOMY     TOTAL ABDOMINAL HYSTERECTOMY W/ BILATERAL SALPINGOOPHORECTOMY  . BARIATRIC SURGERY  05/15/2016  . CHOLECYSTECTOMY    . ESOPHAGOGASTRODUODENOSCOPY  05/15/2016  . KIDNEY STONE SURGERY    . KNEE ARTHROSCOPY Right    X 2  . KNEE SURGERY Left 2006  . SHOULDER ARTHROSCOPY WITH OPEN ROTATOR CUFF REPAIR Right 11/22/2015   Procedure: SHOULDER ARTHROSCOPY, DEBRIDEMENT, DECOMPRESSION, SLAP REPAIR, POSSIBLE BICEP TENODESIS;  Surgeon: Corky Mull, MD;  Location: [facility] ORS;  Service: Orthopedics;  Laterality: Right;  . SINUS EXPLORATION       No current facility-administered medications for this encounter.   Current Outpatient Medications:  .  apixaban (ELIQUIS) 5 MG TABS tablet, Take by mouth., Disp: , Rfl:  .  Cyanocobalamin 500 MCG/0.1ML SOLN, by Nasal route., Disp: , Rfl:  .  diazepam (VALIUM) 2 MG tablet, Take 1 tablet (2 mg total) by mouth every 8 (eight) hours as needed for muscle spasms., Disp: 9 tablet, Rfl: 0 .  EPINEPHrine (EPIPEN 2-PAK) 0.3 mg/0.3 mL IJ SOAJ injection, Inject 0.3 mg into the muscle as needed. For anaphylaxis, Disp: , Rfl:  .  fluticasone (FLONASE) 50 MCG/ACT nasal spray, by Nasal route., Disp: , Rfl:  .  levocetirizine (XYZAL) 5 MG  tablet, Take 5 mg by mouth every evening., Disp: , Rfl:  .  misoprostol (CYTOTEC) 200 MCG tablet, Take 200 mcg by mouth 4 (four) times daily., Disp: , Rfl: 0 .  montelukast (SINGULAIR) 10 MG tablet, Take 10 mg by mouth at bedtime., Disp: , Rfl:  .  Multiple Vitamin (MULTIVITAMIN) tablet, Take 1 tablet by mouth daily., Disp: , Rfl:  .  pantoprazole (PROTONIX) 40 MG tablet, Take 40 mg by mouth 2 (two) times daily., Disp: , Rfl: 2 .  vitamin B-12 (CYANOCOBALAMIN) 1000 MCG tablet,  Take by mouth., Disp: , Rfl:  .  moxifloxacin (VIGAMOX) 0.5 % ophthalmic solution, Place 1 drop into the left eye 3 (three) times daily for 7 days., Disp: 3 mL, Rfl: 0 .  sucralfate (CARAFATE) 1 GM/10ML suspension, Take 10 mLs by mouth 4 (four) times daily -  before meals and at bedtime., Disp: , Rfl:   Allergies Alcohol; Bacitracin-neomycin-polymyxin; Codeine; Hydrocodone; Hydrocortisone; Lanolin; Nickel; Other; Tape; Tramadol; Bacitracin; Clarithromycin; Latex; Oxycodone; Penicillins; and Sulfamethoxazole-trimethoprim  Family History  Problem Relation Age of Onset  . Cancer Mother   . Lung cancer Father   . Hematuria Brother   . Kidney disease Paternal Grandfather   . Kidney Stones Brother   . Bladder Cancer Neg Hx   . Kidney cancer Neg Hx     Social History Social History   Tobacco Use  . Smoking status: Never Smoker  . Smokeless tobacco: Never Used  Substance Use Topics  . Alcohol use: No    Alcohol/week: 0.0 oz  . Drug use: No    Review of Systems Constitutional: No fever/chills Eyes: No visual changes. As above.  ENT: No sore throat. Cardiovascular: Denies chest pain. Respiratory: Denies shortness of breath. Gastrointestinal: No abdominal pain.   Skin: Negative for rash.  ____________________________________________   PHYSICAL EXAM:  VITAL SIGNS: ED Triage Vitals  Enc Vitals Group     BP 05/20/17 0828 114/64     Pulse Rate 05/20/17 0828 77     Resp 05/20/17 0828 16     Temp 05/20/17 0828 97.8 F (36.6 C)     Temp Source 05/20/17 0828 Oral     SpO2 05/20/17 0828 100 %     Weight 05/20/17 0826 175 lb (79.4 kg)     Height 05/20/17 0826 5\' 6"  (1.676 m)     Head Circumference --      Peak Flow --      Pain Score 05/20/17 0827 1     Pain Loc --      Pain Edu? --      Excl. in [place]? --      Visual Acuity  Right Eye Distance: 20/70 uncorrected Left Eye Distance: 20/40 uncorrected Bilateral Distance:    States vision is same as baseline.    Constitutional: Alert and oriented. Well appearing and in no acute distress. Eyes: Mild left eye conjunctival injection, no right conjunctival injection. Scant left yellowish drainage, no right drainage. No foreign bodies bilaterally noted. No surrounding erythema or edema bilaterally. PERRL. EOMI. ENT      Head: Normocephalic and atraumatic.      Ears: Nontender, no erythema, normal TMs bilaterally.      Nose: No congestion/rhinnorhea.      Mouth/Throat: Mucous membranes are moist.Oropharynx non-erythematous. Neck: No stridor. Supple without meningismus.  Hematological/Lymphatic/Immunilogical: No cervical lymphadenopathy. Cardiovascular: Normal rate, regular rhythm. Grossly normal heart sounds.  Good peripheral circulation. Respiratory: Normal respiratory effort without tachypnea nor retractions. Breath sounds are clear  and equal bilaterally. No wheezes, rales, rhonchi. Musculoskeletal: Steady gait. Neurologic:  Normal speech and language.Speech is normal. No gait instability.  Skin:  Skin is warm, dry and intact. No rash noted. Psychiatric: Mood and affect are normal. Speech and behavior are normal. Patient exhibits appropriate insight and judgment   ___________________________________________   LABS (all labs ordered are listed, but only abnormal results are displayed)  Labs Reviewed - No data to display ____________________________________________  PROCEDURES Procedures    INITIAL IMPRESSION / ASSESSMENT AND PLAN / ED COURSE  Pertinent labs & imaging results that were available during my care of the patient were reviewed by me and considered in my medical decision making (see chart for details).  Well-appearing patient.  No acute distress.  Suspect beginning bacterial conjunctivitis.  Will treat with Vigamox.  Encouraged hand hygiene, close monitoring and supportive care.  Work note given for today.Discussed indication, risks and benefits of medications with  patient.  Discussed follow up with Primary care physician or opthalmologist this week as needed. Discussed follow up and return parameters including no resolution or any worsening concerns. Patient verbalized understanding and agreed to plan.   ____________________________________________   FINAL CLINICAL IMPRESSION(S) / ED DIAGNOSES  Final diagnoses:  Acute bacterial conjunctivitis of left eye     ED Discharge Orders        Ordered    moxifloxacin (VIGAMOX) 0.5 % ophthalmic solution  3 times daily     05/20/17 0841       Note: This dictation was prepared with Dragon dictation along with smaller phrase technology. Any transcriptional errors that result from this process are unintentional.         Marylene Land, NP 05/20/17 [phone]

## 2017-06-19 ENCOUNTER — Ambulatory Visit
Admission: EM | Admit: 2017-06-19 | Discharge: 2017-06-19 | Disposition: A | Discharge status: Home / Self Care | Payer: BLUE CROSS/BLUE SHIELD | Attending: Family Medicine | Admitting: Family Medicine

## 2017-06-19 ENCOUNTER — Ambulatory Visit (INDEPENDENT_AMBULATORY_CARE_PROVIDER_SITE_OTHER): Payer: BLUE CROSS/BLUE SHIELD

## 2017-06-19 ENCOUNTER — Encounter: Payer: Self-pay | Admitting: *Deleted

## 2017-06-19 DIAGNOSIS — M11261 Other chondrocalcinosis, right knee: Secondary | ICD-10-CM | POA: Diagnosis not present

## 2017-06-19 MED ORDER — COLCHICINE 0.6 MG PO TABS: ORAL_TABLET | ORAL | 0 refills | Status: DC

## 2017-06-19 NOTE — ED Triage Notes (Signed)
C/O right knee pain that started two days ago. Pt has had two knee surgeries on that knee. Last surgery was 7 years ago. Plus one edema noted to that area.

## 2017-06-19 NOTE — ED Provider Notes (Signed)
[facility]    CSN: [id number] Arrival date & time: 06/19/17  1828     History   Chief Complaint Chief Complaint  Patient presents with  . Knee Pain    HPI Sharon [name] is a 57 y.o. female.   HPI  57 year old female who is presents with a right lateral joint line knee pain that started about 2 days ago.  No recent injury or increase in activities.  States that this afternoon however she was very busy at work and around 1:00 had severe lateral knee pain when she arose from a seated position.  Since that time she has had severe pain over the right lateral knee joint line extending posteriorly into the popliteal area.  She states that it hurts even when sitting but is much worse with ambulation and when loaded with weight.  Has swelling of the knee which is usually not present.  Relates 2 previous arthroscopies of that knee 1 for meniscal tear and the other where they found a Baker's cyst.  He has had a gastric bypass is not able to tolerate oral NSAIDs.        Past Medical History:  Diagnosis Date  . Acid reflux   . Anxiety   . Arthritis   . Bladder spasm   . Depression   . Headache   . Kidney calculi   . Left ureteral stone   . Leg DVT (deep venous thromboembolism), acute ([place])   . Restless leg syndrome   . Sleep apnea    does not have C-PAP @ present  . Urge incontinence     Patient Active Problem List   Diagnosis Date Noted  . Sepsis ([place]) 11/15/2016  . Hydronephrosis 11/15/2016  . GERD (gastroesophageal reflux disease) 11/15/2016  . UTI (urinary tract infection) 11/15/2016  . Right leg DVT ([place]) 01/24/2016  . Kidney stones 09/10/2015  . Urge incontinence 09/10/2015  . Nocturia 09/10/2015    Past Surgical History:  Procedure Laterality Date  . ABDOMINAL HYSTERECTOMY     TOTAL ABDOMINAL HYSTERECTOMY W/ BILATERAL SALPINGOOPHORECTOMY  . BARIATRIC SURGERY  05/15/2016  . CHOLECYSTECTOMY    . ESOPHAGOGASTRODUODENOSCOPY  05/15/2016  . KIDNEY  STONE SURGERY    . KNEE ARTHROSCOPY Right    X 2  . KNEE SURGERY Left 2006  . SHOULDER ARTHROSCOPY WITH OPEN ROTATOR CUFF REPAIR Right 11/22/2015   Procedure: SHOULDER ARTHROSCOPY, DEBRIDEMENT, DECOMPRESSION, SLAP REPAIR, POSSIBLE BICEP TENODESIS;  Surgeon: Corky Mull, MD;  Location: [facility] ORS;  Service: Orthopedics;  Laterality: Right;  . SINUS EXPLORATION      OB History    No data available       Home Medications    Prior to Admission medications   Medication Sig Start Date End Date Taking? Authorizing Provider  apixaban (ELIQUIS) 5 MG TABS tablet Take by mouth. 07/15/16   [provider]  colchicine (COLCRYS) 0.6 MG tablet Take 2 tabs (1.2 MG ) now . Repeat with 0.6 mg 1 hour later.Take  0.6 mg daily thereafter until flareup resolves 06/19/17   Lorin Picket, PA-C  Cyanocobalamin 500 MCG/0.1ML SOLN by Nasal route. 07/07/16   [provider]  diazepam (VALIUM) 2 MG tablet Take 1 tablet (2 mg total) by mouth every 8 (eight) hours as needed for muscle spasms. 04/13/17   Johnn Hai, PA-C  EPINEPHrine (EPIPEN 2-PAK) 0.3 mg/0.3 mL IJ SOAJ injection Inject 0.3 mg into the muscle as needed. For anaphylaxis    [provider]  fluticasone (FLONASE) 50 MCG/ACT nasal spray by Nasal route. 07/05/16   [provider]  levocetirizine (XYZAL) 5 MG tablet Take 5 mg by mouth every evening.    [provider]  misoprostol (CYTOTEC) 200 MCG tablet Take 200 mcg by mouth 4 (four) times daily. 10/08/16   [provider]  montelukast (SINGULAIR) 10 MG tablet Take 10 mg by mouth at bedtime.    [provider]  Multiple Vitamin (MULTIVITAMIN) tablet Take 1 tablet by mouth daily.    [provider]  pantoprazole (PROTONIX) 40 MG tablet Take 40 mg by mouth 2 (two) times daily. 10/08/16   [provider]  sucralfate (CARAFATE) 1 GM/10ML suspension Take 10 mLs by mouth 4 (four) times daily -  before meals and at bedtime. 10/08/16  04/24/17  [provider]  vitamin B-12 (CYANOCOBALAMIN) 1000 MCG tablet Take by mouth.    [provider]    Family History Family History  Problem Relation Age of Onset  . Cancer Mother   . Lung cancer Father   . Hematuria Brother   . Kidney disease Paternal Grandfather   . Kidney Stones Brother   . Bladder Cancer Neg Hx   . Kidney cancer Neg Hx     Social History Social History   Tobacco Use  . Smoking status: Never Smoker  . Smokeless tobacco: Never Used  Substance Use Topics  . Alcohol use: No    Alcohol/week: 0.0 oz  . Drug use: No     Allergies   Alcohol; Bacitracin-neomycin-polymyxin; Codeine; Hydrocodone; Hydrocortisone; Lanolin; Nickel; Other; Tape; Tramadol; Bacitracin; Clarithromycin; Latex; Oxycodone; Penicillins; and Sulfamethoxazole-trimethoprim   Review of Systems Review of Systems  Constitutional: Positive for activity change. Negative for chills, fatigue and fever.  Musculoskeletal: Positive for arthralgias and joint swelling.  All other systems reviewed and are negative.    Physical Exam Triage Vital Signs ED Triage Vitals  Enc Vitals Group     BP 06/19/17 1856 122/60     Pulse Rate 06/19/17 1856 65     Resp 06/19/17 1856 16     Temp 06/19/17 1856 98 F (36.7 C)     Temp Source 06/19/17 1856 Oral     SpO2 06/19/17 1856 99 %     Weight 06/19/17 1858 175 lb (79.4 kg)     Height 06/19/17 1858 5\' 6"  (1.676 m)     Head Circumference --      Peak Flow --      Pain Score 06/19/17 1858 7     Pain Loc --      Pain Edu? --      Excl. in [place]? --    No data found.  Updated Vital Signs BP 122/60 (BP Location: Left Arm)   Pulse 65   Temp 98 F (36.7 C) (Oral)   Resp 16   Ht 5\' 6"  (1.676 m)   Wt 175 lb (79.4 kg)   SpO2 99%   BMI 28.25 kg/m   Visual Acuity Right Eye Distance:   Left Eye Distance:   Bilateral Distance:    Right Eye Near:   Left Eye Near:    Bilateral Near:     Physical Exam  Constitutional: She is  oriented to person, place, and time. She appears well-developed and well-nourished. No distress.  HENT:  Head: Normocephalic.  Eyes: Pupils are equal, round, and reactive to light.  Neck: Normal range of motion.  Musculoskeletal: She exhibits edema and tenderness.  Examination of the right  knee shows less swelling.  There is no effusion palpable.  She has no patellofemoral discomfort.  Maximum tenderness is over the lateral joint line tibial plateau.  It extends posteriorly.  She has no palpable Baker's cyst present today.  Collateral ligaments are intact to clinical stressing as is the anterior and posterior cruciate ligaments.  Does have discomfort with on weighted range of motion.  She has a good quad control with extension.  Has mild warmth and motion is painful passively.  Neurological: She is alert and oriented to person, place, and time.  Skin: Skin is warm and dry. She is not diaphoretic.  Psychiatric: She has a normal mood and affect. Her behavior is normal. Judgment and thought content normal.  Nursing note and vitals reviewed.    UC Treatments / Results  Labs (all labs ordered are listed, but only abnormal results are displayed) Labs Reviewed - No data to display  EKG  EKG Interpretation None       Radiology Dg Knee Complete 4 Views Right  Result Date: 06/19/2017 CLINICAL DATA:  Lateral joint pain. EXAM: RIGHT KNEE - COMPLETE 4+ VIEW COMPARISON:  None. FINDINGS: No acute fracture or dislocation. Small medial femorotibial compartment marginal osteophytes. No significant joint space narrowing. No joint effusion. IMPRESSION: No acute osseous injury of the right knee. Electronically Signed   By: Kathreen Devoid   On: 06/19/2017 20:31    Procedures Procedures (including critical care time)  Medications Ordered in UC Medications - No data to display   Initial Impression / Assessment and Plan / UC Course  I have reviewed the triage vital signs and the nursing  notes.  Pertinent labs & imaging results that were available during my care of the patient were reviewed by me and considered in my medical decision making (see chart for details).     Plan: 1. Test/x-ray results and diagnosis reviewed with patient 2. rx as per orders; risks, benefits, potential side effects reviewed with patient 3. Recommend supportive treatment with rest avoid ambulation until flareup has subsided.  Follow up with orthopedics if not improving.  Place the patient on colchicine since she is unable to tolerate NSAIDs due to her gastric bypass. 4. F/u prn if symptoms worsen or don't improve   Final Clinical Impressions(s) / UC Diagnoses   Final diagnoses:  Pseudogout of knee, right    ED Discharge Orders        Ordered    colchicine (COLCRYS) 0.6 MG tablet  Status:  Discontinued     06/19/17 2050    colchicine (COLCRYS) 0.6 MG tablet     06/19/17 2050       Controlled Substance Prescriptions [place] Controlled Substance Registry consulted? Not Applicable   Lorin Picket, PA-C 06/19/17 2058

## 2017-06-22 ENCOUNTER — Telehealth: Payer: Self-pay | Admitting: *Deleted

## 2017-06-22 NOTE — Telephone Encounter (Signed)
Called patient, no answer, left message advising patient to follow up with PCP if symptoms persist.

## 2017-09-30 ENCOUNTER — Other Ambulatory Visit: Payer: Self-pay

## 2017-09-30 ENCOUNTER — Encounter: Payer: Self-pay | Admitting: Emergency Medicine

## 2017-09-30 ENCOUNTER — Ambulatory Visit (INDEPENDENT_AMBULATORY_CARE_PROVIDER_SITE_OTHER): Payer: BLUE CROSS/BLUE SHIELD

## 2017-09-30 ENCOUNTER — Ambulatory Visit
Admission: EM | Admit: 2017-09-30 | Discharge: 2017-09-30 | Disposition: A | Discharge status: Home / Self Care | Payer: BLUE CROSS/BLUE SHIELD | Attending: Family Medicine | Admitting: Family Medicine

## 2017-09-30 DIAGNOSIS — W108XXA Fall (on) (from) other stairs and steps, initial encounter: Secondary | ICD-10-CM

## 2017-09-30 DIAGNOSIS — W19XXXA Unspecified fall, initial encounter: Secondary | ICD-10-CM

## 2017-09-30 DIAGNOSIS — R0781 Pleurodynia: Secondary | ICD-10-CM

## 2017-09-30 DIAGNOSIS — S20211A Contusion of right front wall of thorax, initial encounter: Secondary | ICD-10-CM

## 2017-09-30 NOTE — ED Provider Notes (Signed)
[facility]    CSN: [id number] Arrival date & time: 09/30/17  0808     History   Chief Complaint Chief Complaint  Patient presents with  . Fall    HPI Sharon [name] is a 57 y.o. female.   57 yo female with a c/o a fall 5 days ago at home and subsequent right upper chest pain. Denies shortness of breath, wheezing, fevers, chills. Denies hitting her head. States she landed some concrete steps at home.   The history is provided by the patient.  Fall     Past Medical History:  Diagnosis Date  . Acid reflux   . Anxiety   . Arthritis   . Bladder spasm   . Depression   . Headache   . Kidney calculi   . Left ureteral stone   . Leg DVT (deep venous thromboembolism), acute ([place])   . Restless leg syndrome   . Sleep apnea    does not have C-PAP @ present  . Urge incontinence     Patient Active Problem List   Diagnosis Date Noted  . Sepsis ([place]) 11/15/2016  . Hydronephrosis 11/15/2016  . GERD (gastroesophageal reflux disease) 11/15/2016  . UTI (urinary tract infection) 11/15/2016  . Right leg DVT ([place]) 01/24/2016  . Kidney stones 09/10/2015  . Urge incontinence 09/10/2015  . Nocturia 09/10/2015    Past Surgical History:  Procedure Laterality Date  . ABDOMINAL HYSTERECTOMY     TOTAL ABDOMINAL HYSTERECTOMY W/ BILATERAL SALPINGOOPHORECTOMY  . BARIATRIC SURGERY  05/15/2016  . CHOLECYSTECTOMY    . ESOPHAGOGASTRODUODENOSCOPY  05/15/2016  . KIDNEY STONE SURGERY    . KNEE ARTHROSCOPY Right    X 2  . KNEE SURGERY Left 2006  . SHOULDER ARTHROSCOPY WITH OPEN ROTATOR CUFF REPAIR Right 11/22/2015   Procedure: SHOULDER ARTHROSCOPY, DEBRIDEMENT, DECOMPRESSION, SLAP REPAIR, POSSIBLE BICEP TENODESIS;  Surgeon: Corky Mull, MD;  Location: [facility] ORS;  Service: Orthopedics;  Laterality: Right;  . SINUS EXPLORATION      OB History   None      Home Medications    Prior to Admission medications   Medication Sig Start Date End Date Taking? Authorizing  Provider  EPINEPHrine (EPIPEN 2-PAK) 0.3 mg/0.3 mL IJ SOAJ injection Inject 0.3 mg into the muscle as needed. For anaphylaxis   Yes [provider]  fluticasone (FLONASE) 50 MCG/ACT nasal spray by Nasal route. 07/05/16  Yes [provider]  levocetirizine (XYZAL) 5 MG tablet Take 5 mg by mouth every evening.   Yes [provider]  montelukast (SINGULAIR) 10 MG tablet Take 10 mg by mouth at bedtime.   Yes [provider]  pantoprazole (PROTONIX) 40 MG tablet Take 40 mg by mouth 2 (two) times daily. 10/08/16  Yes [provider]  apixaban (ELIQUIS) 5 MG TABS tablet Take by mouth. 07/15/16   [provider]  colchicine (COLCRYS) 0.6 MG tablet Take 2 tabs (1.2 MG ) now . Repeat with 0.6 mg 1 hour later.Take  0.6 mg daily thereafter until flareup resolves 06/19/17   Lorin Picket, PA-C  Cyanocobalamin 500 MCG/0.1ML SOLN by Nasal route. 07/07/16   [provider]  diazepam (VALIUM) 2 MG tablet Take 1 tablet (2 mg total) by mouth every 8 (eight) hours as needed for muscle spasms. 04/13/17   Johnn Hai, PA-C  misoprostol (CYTOTEC) 200 MCG tablet Take 200 mcg by mouth 4 (four) times daily. 10/08/16   [provider]  Multiple Vitamin (MULTIVITAMIN) tablet Take 1  tablet by mouth daily.    [provider]  sucralfate (CARAFATE) 1 GM/10ML suspension Take 10 mLs by mouth 4 (four) times daily -  before meals and at bedtime. 10/08/16 04/24/17  [provider]  vitamin B-12 (CYANOCOBALAMIN) 1000 MCG tablet Take by mouth.    [provider]    Family History Family History  Problem Relation Age of Onset  . Cancer Mother   . Lung cancer Father   . Hematuria Brother   . Kidney disease Paternal Grandfather   . Kidney Stones Brother   . Bladder Cancer Neg Hx   . Kidney cancer Neg Hx     Social History Social History   Tobacco Use  . Smoking status: Never Smoker  . Smokeless tobacco: Never Used  Substance  Use Topics  . Alcohol use: No    Alcohol/week: 0.0 oz  . Drug use: No     Allergies   Alcohol; Bacitracin-neomycin-polymyxin; Codeine; Hydrocodone; Hydrocortisone; Lanolin; Nickel; Other; Tape; Tramadol; Bacitracin; Clarithromycin; Latex; Oxycodone; Penicillins; and Sulfamethoxazole-trimethoprim   Review of Systems Review of Systems   Physical Exam Triage Vital Signs ED Triage Vitals  Enc Vitals Group     BP 09/30/17 0818 (!) 102/58     Pulse Rate 09/30/17 0818 64     Resp 09/30/17 0818 18     Temp 09/30/17 0818 98 F (36.7 C)     Temp src --      SpO2 09/30/17 0818 100 %     Weight 09/30/17 0819 178 lb (80.7 kg)     Height 09/30/17 0819 5\' 6"  (1.676 m)     Head Circumference --      Peak Flow --      Pain Score 09/30/17 0819 6     Pain Loc --      Pain Edu? --      Excl. in [place]? --    No data found.  Updated Vital Signs BP (!) 102/58 (BP Location: Left Arm)   Pulse 64   Temp 98 F (36.7 C)   Resp 18   Ht 5\' 6"  (1.676 m)   Wt 178 lb (80.7 kg)   SpO2 100%   BMI 28.73 kg/m   Visual Acuity Right Eye Distance:   Left Eye Distance:   Bilateral Distance:    Right Eye Near:   Left Eye Near:    Bilateral Near:     Physical Exam  Constitutional: She appears well-developed and well-nourished. No distress.  Cardiovascular: Normal rate, regular rhythm and normal heart sounds.  Pulmonary/Chest: Effort normal and breath sounds normal. No stridor. No respiratory distress. She has no wheezes. She has no rales. She exhibits tenderness (right upper chest wall at area of bruising).  Skin: She is not diaphoretic.  Nursing note and vitals reviewed.    UC Treatments / Results  Labs (all labs ordered are listed, but only abnormal results are displayed) Labs Reviewed - No data to display  EKG None  Radiology Dg Ribs Unilateral W/chest Right  Result Date: 09/30/2017 CLINICAL DATA:  Right rib pain after fall. EXAM: RIGHT RIBS AND CHEST - 3+ VIEW COMPARISON:   Radiographs of November 14, 2016. FINDINGS: No fracture or other bone lesions are seen involving the ribs. There is no evidence of pneumothorax or pleural effusion. Both lungs are clear. Heart size and mediastinal contours are within normal limits. IMPRESSION: Normal right ribs.  No acute cardiopulmonary abnormality seen. Electronically Signed   By: Marijo Conception, M.D.  On: 09/30/2017 09:24    Procedures Procedures (including critical care time)  Medications Ordered in UC Medications - No data to display  Initial Impression / Assessment and Plan / UC Course  I have reviewed the triage vital signs and the nursing notes.  Pertinent labs & imaging results that were available during my care of the patient were reviewed by me and considered in my medical decision making (see chart for details).      Final Clinical Impressions(s) / UC Diagnoses   Final diagnoses:  Fall, initial encounter  Contusion of right chest wall, initial encounter     Discharge Instructions     Apply heat/ice tylenol    ED Prescriptions    None     1. x-ray results and diagnosis reviewed with patient 2. Recommend supportive treatment with as above 3. Follow-up prn if symptoms worsen or don't improve  Controlled Substance Prescriptions [place] Controlled Substance Registry consulted? Not Applicable   Norval Gable, MD 09/30/17 1501

## 2017-09-30 NOTE — ED Triage Notes (Signed)
Patient states she fell down concrete steps last Saturday injuring her right arm, shoulder and right side upper back.  This morning patient states the pain is worse

## 2017-09-30 NOTE — Discharge Instructions (Signed)
Apply heat/ice tylenol

## 2018-01-25 ENCOUNTER — Ambulatory Visit: Payer: BLUE CROSS/BLUE SHIELD | Admitting: Urology

## 2018-01-25 ENCOUNTER — Encounter: Payer: Self-pay | Admitting: Urology

## 2018-01-25 ENCOUNTER — Ambulatory Visit
Admission: RE | Admit: 2018-01-25 | Discharge: 2018-01-25 | Disposition: A | Discharge status: Home / Self Care | Payer: BLUE CROSS/BLUE SHIELD | Source: Ambulatory Visit | Attending: Urology | Admitting: Urology

## 2018-01-25 VITALS — BP 121/77 | HR 67 | Wt 186.0 lb

## 2018-01-25 DIAGNOSIS — Z87442 Personal history of urinary calculi: Secondary | ICD-10-CM | POA: Diagnosis not present

## 2018-01-25 DIAGNOSIS — N200 Calculus of kidney: Secondary | ICD-10-CM | POA: Diagnosis not present

## 2018-01-25 DIAGNOSIS — N390 Urinary tract infection, site not specified: Secondary | ICD-10-CM

## 2018-01-25 DIAGNOSIS — R8281 Pyuria: Secondary | ICD-10-CM

## 2018-01-25 DIAGNOSIS — R109 Unspecified abdominal pain: Secondary | ICD-10-CM | POA: Diagnosis not present

## 2018-01-25 MED ORDER — CEFUROXIME AXETIL 500 MG PO TABS: 500.0000 mg | ORAL_TABLET | Freq: Two times a day (BID) | ORAL | 0 refills | Status: AC

## 2018-01-26 ENCOUNTER — Encounter: Payer: Self-pay | Admitting: Urology

## 2018-01-26 LAB — MICROSCOPIC EXAMINATION
Epithelial Cells (non renal): NONE SEEN /hpf (ref 0–10)
RBC, UA: NONE SEEN /hpf (ref 0–2)

## 2018-01-26 LAB — URINALYSIS, COMPLETE
Bilirubin, UA: NEGATIVE
Glucose, UA: NEGATIVE
Ketones, UA: NEGATIVE
Nitrite, UA: NEGATIVE
Protein, UA: NEGATIVE
RBC, UA: NEGATIVE
Specific Gravity, UA: 1.015 (ref 1.005–1.030)
Urobilinogen, Ur: 0.2 mg/dL (ref 0.2–1.0)
pH, UA: 6.5 (ref 5.0–7.5)

## 2018-01-26 NOTE — Progress Notes (Signed)
01/25/2018 7:30 AM   Sharon [name] 02-24-1961 [id number]  Referring provider: Ricardo Jericho, NP [address] [place], [place] [address]  Chief Complaint  Patient presents with  . Back Pain    HPI: 57 year old female with a history of recurrent stone disease and sepsis.  She presents today with a 10-day history of left flank pain associated with bladder pressure, frequency and urgency.  There is radiation to the left lower quadrant.  Pain severity is rated mild to moderate.  There are no identifiable precipitating, aggravating or alleviating factors.  She denies fever or chills.  No nausea or vomiting.   PMH: Past Medical History:  Diagnosis Date  . Acid reflux   . Anxiety   . Arthritis   . Bladder spasm   . Depression   . Headache   . Kidney calculi   . Left ureteral stone   . Leg DVT (deep venous thromboembolism), acute ([place])   . Restless leg syndrome   . Sleep apnea    does not have C-PAP @ present  . Urge incontinence     Surgical History: Past Surgical History:  Procedure Laterality Date  . ABDOMINAL HYSTERECTOMY     TOTAL ABDOMINAL HYSTERECTOMY W/ BILATERAL SALPINGOOPHORECTOMY  . BARIATRIC SURGERY  05/15/2016  . CHOLECYSTECTOMY    . ESOPHAGOGASTRODUODENOSCOPY  05/15/2016  . KIDNEY STONE SURGERY    . KNEE ARTHROSCOPY Right    X 2  . KNEE SURGERY Left 2006  . SHOULDER ARTHROSCOPY WITH OPEN ROTATOR CUFF REPAIR Right 11/22/2015   Procedure: SHOULDER ARTHROSCOPY, DEBRIDEMENT, DECOMPRESSION, SLAP REPAIR, POSSIBLE BICEP TENODESIS;  Surgeon: Corky Mull, MD;  Location: [facility] ORS;  Service: Orthopedics;  Laterality: Right;  . SINUS EXPLORATION      Home Medications:  Allergies as of 01/25/2018      Reactions   Alcohol Other (See Comments)   wool   Bacitracin-neomycin-polymyxin Other (See Comments)   Codeine Nausea Only, Other (See Comments)   "headache"   Hydrocodone Other (See Comments)   Hydrocortisone Other (See Comments)   Lanolin Other (See  Comments)   "rash" Other reaction(s): Unknown   Nickel Other (See Comments)   Other Other (See Comments)   Bitartrate/black rubber dye/elastic   Tape Other (See Comments)   Other reaction(s): Unknown   Tapentadol Other (See Comments)   Tramadol Other (See Comments)   "headache"   Bacitracin Rash   Clarithromycin Rash   Latex Rash   Oxycodone Rash   Penicillins Rash   Has patient had a PCN reaction causing immediate rash, facial/tongue/throat swelling, SOB or lightheadedness with hypotension: yes Has patient had a PCN reaction causing severe rash involving mucus membranes or skin necrosis: no Has patient had a PCN reaction that required hospitalization No Has patient had a PCN reaction occurring within the last 10 years: No If all of the above answers are "NO", then may proceed with Cephalosporin use.   Sulfamethoxazole-trimethoprim Rash      Medication List        Accurate as of 01/25/18 11:59 PM. Always use your most recent med list.          apixaban 5 MG Tabs tablet Commonly known as:  ELIQUIS Take by mouth.   cefUROXime 500 MG tablet Commonly known as:  CEFTIN Take 1 tablet (500 mg total) by mouth 2 (two) times daily with a meal for 7 days.   colchicine 0.6 MG tablet Take 2 tabs (1.2 MG ) now . Repeat with 0.6 mg  1 hour later.Take  0.6 mg daily thereafter until flareup resolves   vitamin B-12 1000 MCG tablet Commonly known as:  CYANOCOBALAMIN Take by mouth.   Cyanocobalamin 500 MCG/0.1ML Soln by Nasal route.   diazepam 2 MG tablet Commonly known as:  VALIUM Take 1 tablet (2 mg total) by mouth every 8 (eight) hours as needed for muscle spasms.   EPIPEN 2-PAK 0.3 mg/0.3 mL Soaj injection Generic drug:  EPINEPHrine Inject 0.3 mg into the muscle as needed. For anaphylaxis   fluticasone 50 MCG/ACT nasal spray Commonly known as:  FLONASE by Nasal route.   levocetirizine 5 MG tablet Commonly known as:  XYZAL Take 5 mg by mouth every evening.   misoprostol  200 MCG tablet Commonly known as:  CYTOTEC Take 200 mcg by mouth 4 (four) times daily.   montelukast 10 MG tablet Commonly known as:  SINGULAIR Take 10 mg by mouth at bedtime.   multivitamin tablet Take 1 tablet by mouth daily.   pantoprazole 40 MG tablet Commonly known as:  PROTONIX Take 40 mg by mouth 2 (two) times daily.   sucralfate 1 GM/10ML suspension Commonly known as:  CARAFATE Take 10 mLs by mouth 4 (four) times daily -  before meals and at bedtime.       Allergies:  Allergies  Allergen Reactions  . Alcohol Other (See Comments)    wool  . Bacitracin-Neomycin-Polymyxin Other (See Comments)  . Codeine Nausea Only and Other (See Comments)    "headache"  . Hydrocodone Other (See Comments)  . Hydrocortisone Other (See Comments)  . Lanolin Other (See Comments)    "rash" Other reaction(s): Unknown  . Nickel Other (See Comments)  . Other Other (See Comments)    Bitartrate/black rubber dye/elastic  . Tape Other (See Comments)    Other reaction(s): Unknown  . Tapentadol Other (See Comments)  . Tramadol Other (See Comments)    "headache"  . Bacitracin Rash  . Clarithromycin Rash  . Latex Rash  . Oxycodone Rash  . Penicillins Rash    Has patient had a PCN reaction causing immediate rash, facial/tongue/throat swelling, SOB or lightheadedness with hypotension: yes Has patient had a PCN reaction causing severe rash involving mucus membranes or skin necrosis: no Has patient had a PCN reaction that required hospitalization No Has patient had a PCN reaction occurring within the last 10 years: No If all of the above answers are "NO", then may proceed with Cephalosporin use.   . Sulfamethoxazole-Trimethoprim Rash    Family History: Family History  Problem Relation Age of Onset  . Cancer Mother   . Lung cancer Father   . Hematuria Brother   . Kidney disease Paternal Grandfather   . Kidney Stones Brother   . Bladder Cancer Neg Hx   . Kidney cancer Neg Hx      Social History:  reports that she has never smoked. She has never used smokeless tobacco. She reports that she does not drink alcohol or use drugs.  ROS: UROLOGY Frequent Urination?: No Hard to postpone urination?: No Burning/pain with urination?: No Get up at night to urinate?: No Leakage of urine?: No Urine stream starts and stops?: No Trouble starting stream?: No Do you have to strain to urinate?: No Blood in urine?: No Urinary tract infection?: No Sexually transmitted disease?: No Injury to kidneys or bladder?: No Painful intercourse?: No Weak stream?: No Currently pregnant?: No Vaginal bleeding?: No Last menstrual period?: N  Gastrointestinal Nausea?: No Vomiting?: No Indigestion/heartburn?: No Diarrhea?: No Constipation?:  No  Constitutional Fever: No Night sweats?: No Weight loss?: No Fatigue?: No  Skin Skin rash/lesions?: No Itching?: No  Eyes Blurred vision?: No Double vision?: No  Ears/Nose/Throat Sore throat?: No Sinus problems?: No  Hematologic/Lymphatic Swollen glands?: No Easy bruising?: No  Cardiovascular Leg swelling?: No Chest pain?: No  Respiratory Cough?: No Shortness of breath?: No  Endocrine Excessive thirst?: No  Musculoskeletal Back pain?: No Joint pain?: No  Neurological Headaches?: No Dizziness?: No  Psychologic Depression?: No Anxiety?: No  Physical Exam: There were no vitals taken for this visit.  Constitutional:  Alert and oriented, No acute distress. HEENT: [place] AT, moist mucus membranes.  Trachea midline, no masses. Cardiovascular: No clubbing, cyanosis, or edema.  RRR Respiratory: Normal respiratory effort, no increased work of breathing.  Lungs clear GI: Abdomen is soft, nontender, nondistended, no abdominal masses GU: No CVA tenderness Lymph: No cervical or inguinal lymphadenopathy. Skin: No rashes, bruises or suspicious lesions. Neurologic: Grossly intact, no focal deficits, moving all 4  extremities. Psychiatric: Normal mood and affect.  Laboratory Data:  Urinalysis Microscopy 6-10 WBCs.   Assessment & Plan:   57 year old female with a history of recurrent stone disease associated with sepsis.  She has left flank pain, frequency and urgency.  A KUB was ordered and if no obvious ureteral calculus we will proceed with a stone protocol CT of the abdomen and pelvis.  Urinalysis today does show pyuria.  With her prior history and a urine culture was ordered and will start on antibiotics pending the culture report.  Rx cefuroxime was sent to her pharmacy.  She was instructed to call for development of fever, chills or worsening pain.   Abbie Sons, [facility] [address], [place], [place] [address] [phone]

## 2018-01-27 ENCOUNTER — Telehealth: Payer: Self-pay

## 2018-01-27 ENCOUNTER — Telehealth: Payer: Self-pay | Admitting: Urology

## 2018-01-27 ENCOUNTER — Ambulatory Visit: Payer: BLUE CROSS/BLUE SHIELD

## 2018-01-27 ENCOUNTER — Ambulatory Visit
Admission: RE | Admit: 2018-01-27 | Discharge: 2018-01-27 | Disposition: A | Discharge status: Home / Self Care | Payer: BLUE CROSS/BLUE SHIELD | Source: Ambulatory Visit | Attending: Urology | Admitting: Urology

## 2018-01-27 DIAGNOSIS — R109 Unspecified abdominal pain: Secondary | ICD-10-CM | POA: Diagnosis not present

## 2018-01-27 LAB — CULTURE, URINE COMPREHENSIVE

## 2018-01-27 NOTE — Telephone Encounter (Signed)
CT was done today in [place]  [facility]

## 2018-01-27 NOTE — Telephone Encounter (Signed)
Patient notified

## 2018-01-27 NOTE — Telephone Encounter (Signed)
Pt calling for results of CX and xray

## 2018-01-27 NOTE — Telephone Encounter (Signed)
-----   Message from Abbie Sons, MD sent at 01/27/2018  1:25 PM EDT ----- CT showed no renal or ureteral calculi.  There is no evidence of obstruction.  Her urine is growing bacteria and her symptoms would be due to infection and not stone

## 2018-01-27 NOTE — Telephone Encounter (Signed)
-----   Message from Abbie Sons, MD sent at 01/26/2018  7:26 AM EDT ----- Patient needs stone protocol CT today or tomorrow.  Order has been entered.

## 2018-01-29 ENCOUNTER — Telehealth: Payer: Self-pay

## 2018-01-29 NOTE — Telephone Encounter (Signed)
-----   Message from Abbie Sons, MD sent at 01/29/2018  8:55 AM EDT ----- Urine culture positive and sensitive to prescribed antibiotic.

## 2018-01-29 NOTE — Telephone Encounter (Signed)
Left mess to notify

## 2018-04-28 ENCOUNTER — Other Ambulatory Visit: Payer: Self-pay | Admitting: Orthopedic Surgery

## 2018-04-28 DIAGNOSIS — M25561 Pain in right knee: Secondary | ICD-10-CM

## 2018-05-11 ENCOUNTER — Ambulatory Visit
Admission: RE | Admit: 2018-05-11 | Discharge: 2018-05-11 | Disposition: A | Discharge status: Home / Self Care | Payer: BLUE CROSS/BLUE SHIELD | Source: Ambulatory Visit | Attending: Orthopedic Surgery | Admitting: Orthopedic Surgery

## 2018-05-11 DIAGNOSIS — S83281A Other tear of lateral meniscus, current injury, right knee, initial encounter: Secondary | ICD-10-CM | POA: Insufficient documentation

## 2018-05-11 DIAGNOSIS — M25561 Pain in right knee: Secondary | ICD-10-CM

## 2018-11-08 ENCOUNTER — Encounter
Admission: RE | Admit: 2018-11-08 | Discharge: 2018-11-08 | Disposition: A | Discharge status: Home / Self Care | Payer: BC Managed Care – PPO | Source: Ambulatory Visit | Attending: Orthopedic Surgery | Admitting: Orthopedic Surgery

## 2018-11-08 ENCOUNTER — Other Ambulatory Visit: Payer: Self-pay

## 2018-11-08 DIAGNOSIS — Z01812 Encounter for preprocedural laboratory examination: Secondary | ICD-10-CM | POA: Diagnosis not present

## 2018-11-08 DIAGNOSIS — Z1159 Encounter for screening for other viral diseases: Secondary | ICD-10-CM | POA: Diagnosis not present

## 2018-11-08 HISTORY — DX: Anemia, unspecified: D64.9

## 2018-11-08 HISTORY — DX: Sepsis, unspecified organism: A41.9

## 2018-11-08 HISTORY — DX: Personal history of urinary calculi: Z87.442

## 2018-11-08 LAB — HEMOGLOBIN: Hemoglobin: 12.8 g/dL (ref 12.0–15.0)

## 2018-11-08 NOTE — Patient Instructions (Signed)
Your procedure is scheduled on: 11-11-18 THURSDAY Report to Same Day Surgery 2nd floor medical mall [facility] Entrance-take elevator on left to 2nd floor.  Check in with surgery information desk.) To find out your arrival time please call [phone] between 1PM - 3PM on 11-10-18 [facility]  Remember: Instructions that are not followed completely may result in serious medical risk, up to and including death, or upon the discretion of your surgeon and anesthesiologist your surgery may need to be rescheduled.    _x___ 1. Do not eat food after midnight the night before your procedure. NO GUM OR CANDY AFTER MIDNIGHT. You may drink clear liquids up to 2 hours before you are scheduled to arrive at the hospital for your procedure.  Do not drink clear liquids within 2 hours of your scheduled arrival to the hospital.  Clear liquids include  --Water or Apple juice without pulp  --Clear carbohydrate beverage such as ClearFast or Gatorade  --Black Coffee or Clear Tea (No milk, no creamers, do not add anything to the coffee or Tea   ____Ensure clear carbohydrate drink on the way to the hospital for bariatric patients  ____Ensure clear carbohydrate drink 3 hours before surgery for Dr Dwyane Luo patients if physician instructed.    __x__ 2. No Alcohol for 24 hours before or after surgery.   __x__3. No Smoking or e-cigarettes for 24 prior to surgery.  Do not use any chewable tobacco products for at least 6 hour prior to surgery   ____  4. Bring all medications with you on the day of surgery if instructed.    __x__ 5. Notify your doctor if there is any change in your medical condition     (cold, fever, infections).    x___6. On the morning of surgery brush your teeth with toothpaste and water.  You may rinse your mouth with mouth wash if you wish.  Do not swallow any toothpaste or mouthwash.   Do not wear jewelry, make-up, hairpins, clips or nail polish.  Do not wear lotions, powders, or perfumes.  You may wear deodorant.  Do not shave 48 hours prior to surgery. Men may shave face and neck.  Do not bring valuables to the hospital.    [facility] is not responsible for any belongings or valuables.               Contacts, dentures or bridgework may not be worn into surgery.  Leave your suitcase in the car. After surgery it may be brought to your room.  For patients admitted to the hospital, discharge time is determined by your treatment team.  _  Patients discharged the day of surgery will not be allowed to drive home.  You will need someone to drive you home and stay with you the night of your procedure.    Please read over the following fact sheets that you were given:   [facility] Preparing for Surgery  ____ Take anti-hypertensive listed below, cardiac, seizure, asthma, anti-reflux and psychiatric medicines. These include:  1. NONE  2.  3.  4.  5.  6.  ____Fleets enema or Magnesium Citrate as directed.   _x___ Use CHG Soap or sage wipes as directed on instruction sheet   ____ Use inhalers on the day of surgery and bring to hospital day of surgery  ____ Stop Metformin and Janumet 2 days prior to surgery.    ____ Take 1/2 of usual insulin dose the night before surgery and none on the morning  surgery.   ____ Follow recommendations from Cardiologist, Pulmonologist or PCP regarding stopping Aspirin, Coumadin, Plavix ,Eliquis, Effient, or Pradaxa, and Pletal.  X____Stop Anti-inflammatories such as Advil, Aleve, Ibuprofen, Motrin, Naproxen, Naprosyn, Goodies powders or aspirin products NOW-OK to take Tylenol    ____ Stop supplements until after surgery.     ____ Bring C-Pap to the hospital.

## 2018-11-08 NOTE — Pre-Procedure Instructions (Signed)
Lloyd Huger, MD  Physician  Oncology     Progress Notes  Signed     Encounter Date:  03/07/2016                  Signed          Expand All Collapse All            Expand widget buttonCollapse widget button    Show:Clear all   ManualTemplateCopied  Added by:     Lloyd Huger, MD   Hover for detailscustomization button                                                                                                                                                       [facility]   [phone] [phone]     ID: Zebedee Iba OB: [birth year]  MR#: [id number]  INO#:[id number]     Patient Care Team:  Ricardo Jericho, NP as PCP - General (Family Medicine)     CHIEF COMPLAINT: Acute DVT of the popliteal vein of the right lower extremity.     INTERVAL HISTORY: Patient returns to clinic today for further evaluation and discussion of her laboratory work. She currently feels well and is asymptomatic.  She is tolerating Eliquis well with out significant side effects. She has no neurologic complaints. She denies any recent fevers or illnesses. She has no chest pain or shortness of breath. She has a good appetite and denies weight loss. She has no nausea, vomiting, constipation, or diarrhea. She has no urinary complaints. Patient offers no specific complaints today.     REVIEW OF SYSTEMS:    Review of Systems   Constitutional: Negative.  Negative for fever, malaise/fatigue and weight loss.   Respiratory: Negative.  Negative for cough and shortness of breath.    Cardiovascular: Negative for chest pain and leg swelling.   Gastrointestinal: Negative.  Negative for abdominal pain.   Genitourinary: Negative.     Musculoskeletal: Negative.  Negative for joint pain.   Neurological: Negative.  Negative for weakness.   Endo/Heme/Allergies: Does not bruise/bleed easily.   Psychiatric/Behavioral: Negative.  The patient is not nervous/anxious.          As per HPI. Otherwise, a complete review of systems is negative.     PAST MEDICAL HISTORY:       Past Medical History:    Diagnosis   Date    .   Acid reflux        .   Anxiety        .   Arthritis        .   Bladder spasm        .   Depression        .  Headache        .   Kidney calculi        .   Left ureteral stone        .   Leg DVT (deep venous thromboembolism), acute ([place])        .   Restless leg syndrome        .   Sleep apnea            does not have C-PAP @ present    .   Urge incontinence             PAST SURGICAL HISTORY:        Past Surgical History:    Procedure   Laterality   Date    .   ABDOMINAL HYSTERECTOMY            .   CHOLECYSTECTOMY            .   KIDNEY STONE SURGERY            .   KNEE ARTHROSCOPY   Right            X 2    .   SHOULDER ARTHROSCOPY WITH OPEN ROTATOR CUFF REPAIR   Right   11/22/2015        Procedure: SHOULDER ARTHROSCOPY, DEBRIDEMENT, DECOMPRESSION, SLAP REPAIR, POSSIBLE BICEP TENODESIS;  Surgeon: Corky Mull, MD;  Location: [facility] ORS;  Service: Orthopedics;  Laterality: Right;    .   SINUS EXPLORATION                 FAMILY HISTORY:        Family History    Problem   Relation   Age of Onset    .   Cancer   Mother        .   Lung cancer   Father        .   Hematuria   Brother        .   Kidney disease   Paternal Grandfather        .   Kidney Stones   Brother        .   Bladder Cancer   Neg Hx             ADVANCED DIRECTIVES (Y/N):  N     HEALTH MAINTENANCE:        Social History    Substance Use Topics    .   Smoking status:   Never Smoker    .   Smokeless tobacco:   Never Used    .   Alcohol use   No                     Colonoscopy:              PAP:              Bone density:              Lipid panel:           Allergies    Allergen   Reactions    .   Alcohol   Other (See Comments)            wool    .   Bacitracin-Neomycin-Polymyxin   Other (See Comments)    .   Codeine   Nausea Only and Other (See Comments)            "  headache"    .   Hydrocodone   Other (See Comments)    .   Hydrocortisone   Other (See Comments)    .   Lanolin   Other (See Comments)            "rash"    .   Nickel   Other (See Comments)    .   Tape   Other (See Comments)    .   Tramadol   Other (See Comments)            "headache"    .   Bacitracin   Rash    .   Clarithromycin   Rash    .   Latex   Rash    .   Oxycodone   Rash    .   Penicillins   Rash            Has patient had a PCN reaction causing immediate rash, facial/tongue/throat swelling, SOB or lightheadedness with hypotension: yes  Has patient had a PCN reaction causing severe rash involving mucus membranes or skin necrosis: no  Has patient had a PCN reaction that required hospitalization No  Has patient had a PCN reaction occurring within the last 10 years: No  If all of the above answers are "NO", then may proceed with Cephalosporin use.       .   Sulfamethoxazole-Trimethoprim   Rash                Current Outpatient Prescriptions    Medication   Sig   Dispense   Refill    .   apixaban (ELIQUIS) 5 MG TABS tablet   10mg  twice per day for 7 days then  5mg  twice per day   70 tablet   0    .   EPINEPHrine (EPIPEN 2-PAK) 0.3 mg/0.3 mL IJ SOAJ injection   Inject 0.3 mg into the muscle as needed. For anaphylaxis             .   levocetirizine (XYZAL) 5 MG tablet   Take 5 mg by mouth every evening.            .   montelukast (SINGULAIR) 10 MG tablet   Take 10 mg by mouth at bedtime.            .   Olopatadine HCl (PATADAY) 0.2 % SOLN   Place 1 drop into both eyes 2 (two) times daily as needed. For dry eyes            .   omeprazole (PRILOSEC) 40 MG capsule   Take 40 mg by mouth 2 (two) times daily.       3        No current facility-administered medications for this visit.          OBJECTIVE:      Vitals:        03/07/16 0912    BP:   125/78    Pulse:   76    Resp:   20    Temp:   97.2 F (36.2 C)       Body mass index is 41.28 kg/m.    ECOG FS:0 - Asymptomatic     General: Well-developed, well-nourished, no acute distress.  Eyes: Pink conjunctiva, anicteric sclera.  Lungs: Clear to auscultation bilaterally.  Heart: Regular rate and rhythm. No rubs, murmurs, or gallops.  Abdomen: Soft, nontender, nondistended. No organomegaly noted, normoactive bowel  sounds.  Musculoskeletal: No edema, cyanosis, or clubbing.  Neuro: Alert, answering all questions appropriately. Cranial nerves grossly intact.  Skin: No rashes or petechiae noted.  Psych: Normal affect.        LAB RESULTS:     Recent Labs                                                                                                                                                                                                                                               Recent Labs                                                                                                                STUDIES:   Imaging  Results          ASSESSMENT: Acute DVT of the popliteal vein of the right lower extremity.     PLAN:       1. Acute DVT of the popliteal vein of the right lower extremity: Although unusual, this was likely secondary to transient risk factor of right shoulder surgery. Full hypercoagulable workup was negative except for an elevated DRVVT. Repeat laboratory work after discontinuation of Eliquis was normal.  Patient does not require further anticoagulation.  No further follow up is necessary.      Patient expressed understanding and was in agreement with this plan. She also understands that She can call clinic at any time with any questions, concerns, or complaints.         Lloyd Huger, MD   03/14/2016 8:59 PM                      Electronically signed by Lloyd Huger, MD at 03/14/2016  9:07 PM             Office Visit on 03/07/2016  Detailed Report

## 2018-11-09 LAB — NOVEL CORONAVIRUS, NAA (HOSP ORDER, SEND-OUT TO REF LAB; TAT 18-24 HRS): SARS-CoV-2, NAA: NOT DETECTED

## 2018-11-10 MED ORDER — CLINDAMYCIN PHOSPHATE 900 MG/50ML IV SOLN
900.0000 mg | Freq: Once | INTRAVENOUS | Status: AC
  Administered 2018-11-11: 900 mg via INTRAVENOUS

## 2018-11-11 ENCOUNTER — Ambulatory Visit: Payer: BC Managed Care – PPO | Admitting: Certified Registered Nurse Anesthetist

## 2018-11-11 ENCOUNTER — Encounter
Admission: RE | Disposition: A | Discharge status: Home / Self Care | Payer: Self-pay | Source: Home / Self Care | Attending: Orthopedic Surgery

## 2018-11-11 ENCOUNTER — Ambulatory Visit
Admission: RE | Admit: 2018-11-11 | Discharge: 2018-11-11 | Disposition: A | Discharge status: Home / Self Care | Payer: BC Managed Care – PPO | Attending: Orthopedic Surgery | Admitting: Orthopedic Surgery

## 2018-11-11 ENCOUNTER — Encounter: Payer: Self-pay | Admitting: Certified Registered Nurse Anesthetist

## 2018-11-11 ENCOUNTER — Other Ambulatory Visit: Payer: Self-pay

## 2018-11-11 DIAGNOSIS — M6751 Plica syndrome, right knee: Secondary | ICD-10-CM | POA: Insufficient documentation

## 2018-11-11 DIAGNOSIS — S83241A Other tear of medial meniscus, current injury, right knee, initial encounter: Secondary | ICD-10-CM | POA: Insufficient documentation

## 2018-11-11 DIAGNOSIS — G4730 Sleep apnea, unspecified: Secondary | ICD-10-CM | POA: Diagnosis not present

## 2018-11-11 DIAGNOSIS — S83271A Complex tear of lateral meniscus, current injury, right knee, initial encounter: Secondary | ICD-10-CM | POA: Diagnosis not present

## 2018-11-11 DIAGNOSIS — M1731 Unilateral post-traumatic osteoarthritis, right knee: Secondary | ICD-10-CM | POA: Diagnosis not present

## 2018-11-11 DIAGNOSIS — X58XXXA Exposure to other specified factors, initial encounter: Secondary | ICD-10-CM | POA: Diagnosis not present

## 2018-11-11 DIAGNOSIS — Z9884 Bariatric surgery status: Secondary | ICD-10-CM | POA: Diagnosis not present

## 2018-11-11 DIAGNOSIS — K219 Gastro-esophageal reflux disease without esophagitis: Secondary | ICD-10-CM | POA: Diagnosis not present

## 2018-11-11 DIAGNOSIS — M25561 Pain in right knee: Secondary | ICD-10-CM | POA: Diagnosis present

## 2018-11-11 DIAGNOSIS — Z86718 Personal history of other venous thrombosis and embolism: Secondary | ICD-10-CM | POA: Diagnosis not present

## 2018-11-11 HISTORY — PX: KNEE ARTHROSCOPY: SHX127

## 2018-11-11 SURGERY — ARTHROSCOPY, KNEE
Anesthesia: General | Laterality: Right

## 2018-11-11 MED ORDER — ACETAMINOPHEN 325 MG PO TABS: 325.0000 mg | ORAL_TABLET | Freq: Four times a day (QID) | ORAL | Status: DC | PRN

## 2018-11-11 MED ORDER — APIXABAN 5 MG PO TABS: 5.0000 mg | ORAL_TABLET | Freq: Two times a day (BID) | ORAL | 0 refills | Status: DC

## 2018-11-11 MED ORDER — PROPOFOL 10 MG/ML IV BOLUS
INTRAVENOUS | Status: DC | PRN
  Administered 2018-11-11: 160 mg via INTRAVENOUS

## 2018-11-11 MED ORDER — LIDOCAINE HCL (PF) 2 % IJ SOLN
INTRAMUSCULAR | Status: AC
  Filled 2018-11-11: qty 10

## 2018-11-11 MED ORDER — MORPHINE SULFATE 15 MG PO TABS: 15.0000 mg | ORAL_TABLET | ORAL | 0 refills | Status: DC | PRN

## 2018-11-11 MED ORDER — FAMOTIDINE 20 MG PO TABS
ORAL_TABLET | ORAL | Status: AC
  Administered 2018-11-11: 20 mg via ORAL
  Filled 2018-11-11: qty 1

## 2018-11-11 MED ORDER — METOCLOPRAMIDE HCL 10 MG PO TABS: 5.0000 mg | ORAL_TABLET | Freq: Three times a day (TID) | ORAL | Status: DC | PRN

## 2018-11-11 MED ORDER — SODIUM CHLORIDE 0.9 % IV SOLN: INTRAVENOUS | Status: DC

## 2018-11-11 MED ORDER — MORPHINE SULFATE 15 MG PO TABS
15.0000 mg | ORAL_TABLET | ORAL | Status: DC | PRN
  Filled 2018-11-11: qty 1

## 2018-11-11 MED ORDER — FENTANYL CITRATE (PF) 100 MCG/2ML IJ SOLN
INTRAMUSCULAR | Status: AC
  Administered 2018-11-11: 25 ug via INTRAVENOUS
  Filled 2018-11-11: qty 2

## 2018-11-11 MED ORDER — MIDAZOLAM HCL 2 MG/2ML IJ SOLN
INTRAMUSCULAR | Status: DC | PRN
  Administered 2018-11-11: 2 mg via INTRAVENOUS

## 2018-11-11 MED ORDER — METHOCARBAMOL 1000 MG/10ML IJ SOLN
500.0000 mg | Freq: Four times a day (QID) | INTRAVENOUS | Status: DC | PRN
  Filled 2018-11-11: qty 5

## 2018-11-11 MED ORDER — CLINDAMYCIN PHOSPHATE 900 MG/50ML IV SOLN
INTRAVENOUS | Status: AC
  Filled 2018-11-11: qty 50

## 2018-11-11 MED ORDER — ONDANSETRON HCL 4 MG PO TABS: 4.0000 mg | ORAL_TABLET | Freq: Four times a day (QID) | ORAL | Status: DC | PRN

## 2018-11-11 MED ORDER — BUPIVACAINE-EPINEPHRINE (PF) 0.5% -1:200000 IJ SOLN
INTRAMUSCULAR | Status: AC
  Filled 2018-11-11: qty 30

## 2018-11-11 MED ORDER — FENTANYL CITRATE (PF) 100 MCG/2ML IJ SOLN
25.0000 ug | INTRAMUSCULAR | Status: DC | PRN
  Administered 2018-11-11 (×4): 25 ug via INTRAVENOUS
  Administered 2018-11-11: 50 ug via INTRAVENOUS

## 2018-11-11 MED ORDER — HYDROMORPHONE HCL 1 MG/ML IJ SOLN
INTRAMUSCULAR | Status: AC
  Administered 2018-11-11: 0.5 mg via INTRAVENOUS
  Filled 2018-11-11: qty 1

## 2018-11-11 MED ORDER — LIDOCAINE HCL (CARDIAC) PF 100 MG/5ML IV SOSY
PREFILLED_SYRINGE | INTRAVENOUS | Status: DC | PRN
  Administered 2018-11-11: 80 mg via INTRAVENOUS

## 2018-11-11 MED ORDER — ONDANSETRON HCL 4 MG/2ML IJ SOLN: 4.0000 mg | Freq: Four times a day (QID) | INTRAMUSCULAR | Status: DC | PRN

## 2018-11-11 MED ORDER — METOCLOPRAMIDE HCL 5 MG/ML IJ SOLN: 5.0000 mg | Freq: Three times a day (TID) | INTRAMUSCULAR | Status: DC | PRN

## 2018-11-11 MED ORDER — PROPOFOL 10 MG/ML IV BOLUS
INTRAVENOUS | Status: AC
  Filled 2018-11-11: qty 20

## 2018-11-11 MED ORDER — FENTANYL CITRATE (PF) 100 MCG/2ML IJ SOLN
INTRAMUSCULAR | Status: AC
  Filled 2018-11-11: qty 2

## 2018-11-11 MED ORDER — BUPIVACAINE-EPINEPHRINE (PF) 0.5% -1:200000 IJ SOLN
INTRAMUSCULAR | Status: DC | PRN
  Administered 2018-11-11: 30 mL via PERINEURAL

## 2018-11-11 MED ORDER — FAMOTIDINE 20 MG PO TABS
20.0000 mg | ORAL_TABLET | Freq: Once | ORAL | Status: AC
  Administered 2018-11-11: 20 mg via ORAL

## 2018-11-11 MED ORDER — ONDANSETRON HCL 4 MG/2ML IJ SOLN
INTRAMUSCULAR | Status: AC
  Filled 2018-11-11: qty 2

## 2018-11-11 MED ORDER — HYDROMORPHONE HCL 1 MG/ML IJ SOLN
0.5000 mg | INTRAMUSCULAR | Status: DC | PRN
  Administered 2018-11-11 (×3): 0.5 mg via INTRAVENOUS

## 2018-11-11 MED ORDER — KETAMINE HCL 50 MG/ML IJ SOLN
INTRAMUSCULAR | Status: DC | PRN
  Administered 2018-11-11: 50 mg via INTRAMUSCULAR

## 2018-11-11 MED ORDER — ONDANSETRON HCL 4 MG/2ML IJ SOLN
INTRAMUSCULAR | Status: DC | PRN
  Administered 2018-11-11: 4 mg via INTRAVENOUS

## 2018-11-11 MED ORDER — GABAPENTIN 300 MG PO CAPS: 300.0000 mg | ORAL_CAPSULE | Freq: Three times a day (TID) | ORAL | Status: DC

## 2018-11-11 MED ORDER — FENTANYL CITRATE (PF) 100 MCG/2ML IJ SOLN
INTRAMUSCULAR | Status: DC | PRN
  Administered 2018-11-11 (×4): 25 ug via INTRAVENOUS

## 2018-11-11 MED ORDER — MIDAZOLAM HCL 2 MG/2ML IJ SOLN
INTRAMUSCULAR | Status: AC
  Filled 2018-11-11: qty 2

## 2018-11-11 MED ORDER — LACTATED RINGERS IV SOLN
INTRAVENOUS | Status: DC
  Administered 2018-11-11: 09:00:00 via INTRAVENOUS

## 2018-11-11 MED ORDER — METHOCARBAMOL 500 MG PO TABS
500.0000 mg | ORAL_TABLET | Freq: Four times a day (QID) | ORAL | Status: DC | PRN
  Filled 2018-11-11: qty 1

## 2018-11-11 SURGICAL SUPPLY — 29 items
BANDAGE ACE 4X5 VEL STRL LF (GAUZE/BANDAGES/DRESSINGS) IMPLANT
BLADE INCISOR PLUS 4.5 (BLADE) IMPLANT
CHLORAPREP W/TINT 26 (MISCELLANEOUS) ×2 IMPLANT
COVER WAND RF STERILE (DRAPES) ×2 IMPLANT
CUFF TOURN SGL QUICK 24 (TOURNIQUET CUFF)
CUFF TOURN SGL QUICK 30 (TOURNIQUET CUFF)
CUFF TRNQT CYL 24X4X16.5-23 (TOURNIQUET CUFF) IMPLANT
CUFF TRNQT CYL 30X4X21-28X (TOURNIQUET CUFF) IMPLANT
GAUZE SPONGE 4X4 12PLY STRL (GAUZE/BANDAGES/DRESSINGS) ×2 IMPLANT
GLOVE SURG SYN 9.0  PF PI (GLOVE) ×1
GLOVE SURG SYN 9.0 PF PI (GLOVE) ×1 IMPLANT
GOWN SRG 2XL LVL 4 RGLN SLV (GOWNS) ×1 IMPLANT
GOWN STRL NON-REIN 2XL LVL4 (GOWNS) ×1
GOWN STRL REUS W/ TWL LRG LVL3 (GOWN DISPOSABLE) ×2 IMPLANT
GOWN STRL REUS W/TWL LRG LVL3 (GOWN DISPOSABLE) ×1
IV LACTATED RINGER IRRG 3000ML (IV SOLUTION) ×2
IV LR IRRIG 3000ML ARTHROMATIC (IV SOLUTION) ×2 IMPLANT
KIT TURNOVER KIT A (KITS) ×2 IMPLANT
MANIFOLD NEPTUNE II (INSTRUMENTS) ×2 IMPLANT
NEEDLE HYPO 22GX1.5 SAFETY (NEEDLE) ×2 IMPLANT
PACK ARTHROSCOPY KNEE (MISCELLANEOUS) ×2 IMPLANT
SCALPEL PROTECTED #11 DISP (BLADE) ×2 IMPLANT
SET TUBE SUCT SHAVER OUTFL 24K (TUBING) ×2 IMPLANT
SET TUBE TIP INTRA-ARTICULAR (MISCELLANEOUS) ×2 IMPLANT
SUT ETHILON 4-0 (SUTURE) ×1
SUT ETHILON 4-0 FS2 18XMFL BLK (SUTURE) ×1
SUTURE ETHLN 4-0 FS2 18XMF BLK (SUTURE) ×1 IMPLANT
TUBING ARTHRO INFLOW-ONLY STRL (TUBING) ×2 IMPLANT
WAND COBLATION FLOW 50 (SURGICAL WAND) ×2 IMPLANT

## 2018-11-11 NOTE — Anesthesia Postprocedure Evaluation (Signed)
Anesthesia Post Note  Patient: Sharon [name]  Procedure(s) Performed: Partial right knee arthroscopy,arthroscopic medial and lateral menisectomy, plica excision (Right )  Patient location during evaluation: PACU Anesthesia Type: General Level of consciousness: awake and alert Pain management: pain level controlled Vital Signs Assessment: post-procedure vital signs reviewed and stable Respiratory status: spontaneous breathing, nonlabored ventilation, respiratory function stable and patient connected to nasal cannula oxygen Cardiovascular status: blood pressure returned to baseline and stable Postop Assessment: no apparent nausea or vomiting Anesthetic complications: no     Last Vitals:  Vitals:   11/11/18 1208 11/11/18 1249  BP: 111/66 (!) 101/54  Pulse: 67 70  Resp: 14 14  Temp: (!) 36.3 C   SpO2: 95% 96%    Last Pain:  Vitals:   11/11/18 1249  TempSrc:   PainSc: 3                  Precious Haws Piscitello

## 2018-11-11 NOTE — Anesthesia Post-op Follow-up Note (Signed)
Anesthesia QCDR form completed.        

## 2018-11-11 NOTE — Transfer of Care (Signed)
Immediate Anesthesia Transfer of Care Note  Patient: Sharon [name]  Procedure(s) Performed: Partial right knee arthroscopy,arthroscopic medial and lateral menisectomy, plica excision (Right )  Patient Location: PACU  Anesthesia Type:General  Level of Consciousness: drowsy  Airway & Oxygen Therapy: Patient Spontanous Breathing and Patient connected to face mask oxygen  Post-op Assessment: Report given to RN and Post -op Vital signs reviewed and stable  Post vital signs: Reviewed and stable  Last Vitals:  Vitals Value Taken Time  BP 130/81 11/11/18 1035  Temp    Pulse 77 11/11/18 1036  Resp 14 11/11/18 1036  SpO2 100 % 11/11/18 1036  Vitals shown include unvalidated device data.  Last Pain:  Vitals:   11/11/18 1035  TempSrc:   PainSc: (P) Asleep      Patients Stated Pain Goal: 0 (25/63/89 3734)  Complications: No apparent anesthesia complications

## 2018-11-11 NOTE — Anesthesia Preprocedure Evaluation (Signed)
Anesthesia Evaluation  Patient identified by MRN, date of birth, ID band Patient awake    Reviewed: Allergy & Precautions, H&P , NPO status , Patient's Chart, lab work & pertinent test results  History of Anesthesia Complications Negative for: history of anesthetic complications  Airway Mallampati: III  TM Distance: <3 FB Neck ROM: limited    Dental  (+) Chipped   Pulmonary neg shortness of breath, sleep apnea ,           Cardiovascular Exercise Tolerance: Good (-) angina(-) Past MI and (-) DOE negative cardio ROS       Neuro/Psych  Headaches, PSYCHIATRIC DISORDERS    GI/Hepatic Neg liver ROS, GERD  Medicated and Controlled,  Endo/Other  negative endocrine ROS  Renal/GU Renal disease     Musculoskeletal   Abdominal   Peds  Hematology negative hematology ROS (+)   Anesthesia Other Findings Past Medical History: No date: Acid reflux     Comment:  H/O PRIOR TO GASTRC BYPASS No date: Anemia     Comment:  H/O No date: Anxiety No date: Arthritis No date: Bladder spasm No date: Depression No date: Headache     Comment:  H/O MIGRAINES No date: History of kidney stones No date: Left ureteral stone No date: Leg DVT (deep venous thromboembolism), acute ([facility])     Comment:  x 2-both times after a surgery-went to see hematologist               2017 (Dr Grayland Ormond)  and no clotting disorder ever found No date: Restless leg syndrome No date: Sepsis ([facility])     Comment:  FROM KIDNEY STONES No date: Sleep apnea     Comment:  HAD GASTRIC BYPASS AND DOES NOT HAVE SLEEP APNEA No date: Urge incontinence  Past Surgical History: No date: ABDOMINAL HYSTERECTOMY     Comment:  TOTAL ABDOMINAL HYSTERECTOMY W/ BILATERAL               SALPINGOOPHORECTOMY 05/15/2016: BARIATRIC SURGERY No date: CHOLECYSTECTOMY 05/15/2016: ESOPHAGOGASTRODUODENOSCOPY No date: KIDNEY STONE SURGERY No date: KNEE ARTHROSCOPY; Right     Comment:  X  2 2006: KNEE SURGERY; Left 11/22/2015: SHOULDER ARTHROSCOPY WITH OPEN ROTATOR CUFF REPAIR; Right     Comment:  Procedure: SHOULDER ARTHROSCOPY, DEBRIDEMENT,               DECOMPRESSION, SLAP REPAIR, POSSIBLE BICEP TENODESIS;                Surgeon: Corky Mull, MD;  Location: [facility] ORS;  Service:              Orthopedics;  Laterality: Right; No date: SINUS EXPLORATION  BMI    Body Mass Index: 32.38 kg/m      Reproductive/Obstetrics negative OB ROS                             Anesthesia Physical Anesthesia Plan  ASA: III  Anesthesia Plan: General LMA   Post-op Pain Management:    Induction: Intravenous  PONV Risk Score and Plan: Dexamethasone, Ondansetron, Midazolam and Treatment may vary due to age or medical condition  Airway Management Planned: LMA  Additional Equipment:   Intra-op Plan:   Post-operative Plan: Extubation in OR  Informed Consent: I have reviewed the patients History and Physical, chart, labs and discussed the procedure including the risks, benefits and alternatives for the proposed anesthesia with the patient or authorized representative who has indicated his/her  understanding and acceptance.     Dental Advisory Given  Plan Discussed with: Anesthesiologist, CRNA and Surgeon  Anesthesia Plan Comments: (Patient consented for risks of anesthesia including but not limited to:  - adverse reactions to medications - damage to teeth, lips or other oral mucosa - sore throat or hoarseness - Damage to heart, brain, lungs or loss of life  Patient voiced understanding.)        Anesthesia Quick Evaluation

## 2018-11-11 NOTE — H&P (Signed)
Reviewed paper H+P, will be scanned into chart. No changes noted.  

## 2018-11-11 NOTE — Anesthesia Procedure Notes (Signed)
Procedure Name: LMA Insertion Date/Time: 11/11/2018 9:36 AM Performed by: Eben Burow, CRNA Pre-anesthesia Checklist: Patient identified, Emergency Drugs available, Suction available and Patient being monitored Patient Re-evaluated:Patient Re-evaluated prior to induction Oxygen Delivery Method: Circle system utilized Preoxygenation: Pre-oxygenation with 100% oxygen Induction Type: IV induction Ventilation: Mask ventilation without difficulty LMA: LMA inserted LMA Size: 3.0 Number of attempts: 1 Placement Confirmation: positive ETCO2 and breath sounds checked- equal and bilateral Tube secured with: Tape Dental Injury: Teeth and Oropharynx as per pre-operative assessment

## 2018-11-11 NOTE — Discharge Instructions (Addendum)
Take pain medication and Eliquis as directed.  Try to leave bandage in place and to return visit.  Weightbearing as tolerated on your right leg but try to minimize activity through the weekend.  AMBULATORY SURGERY  DISCHARGE INSTRUCTIONS   1) The drugs that you were given will stay in your system until tomorrow so for the next 24 hours you should not:  A) Drive an automobile B) Make any legal decisions C) Drink any alcoholic beverage   2) You may resume regular meals tomorrow.  Today it is better to start with liquids and gradually work up to solid foods.  You may eat anything you prefer, but it is better to start with liquids, then soup and crackers, and gradually work up to solid foods.   3) Please notify your doctor immediately if you have any unusual bleeding, trouble breathing, redness and pain at the surgery site, drainage, fever, or pain not relieved by medication.    4) Additional Instructions:        Please contact your physician with any problems or Same Day Surgery at [phone], Monday through Friday 6 am to 4 pm, or [place] at [facility] number at [phone].

## 2018-11-11 NOTE — Op Note (Signed)
11/11/2018  10:35 AM  PATIENT:  Sharon [name]  58 y.o. female  PRE-OPERATIVE DIAGNOSIS:  CYST OF ANTERIOR HORN OF LATERAL MENISCUS RIGHT medial meniscus tear and osteoarthritis  POST-OPERATIVE DIAGNOSIS:  CYST OF ANTERIOR HORN OF LATERAL MENISCUS RIGHT medial meniscus tear and osteoarthritis with medial plica band  PROCEDURE:  Procedure(s): Partial right knee arthroscopy,arthroscopic medial and lateral menisectomy, plica excision (Right)  SURGEON: Laurene Footman, MD  ASSISTANTS: None  ANESTHESIA:   general  EBL:  Total I/O In: 500 [I.V.:500] Out: 5 [Blood:5]  BLOOD ADMINISTERED:none  DRAINS: none   LOCAL MEDICATIONS USED:  MARCAINE     SPECIMEN:  No Specimen  DISPOSITION OF SPECIMEN:  N/A  COUNTS:  YES  TOURNIQUET: No tourniquet utilized  IMPLANTS: None  DICTATION: .Dragon Dictation patient was brought to the operating room and after adequate anesthesia was obtained the right leg was placed in the arthroscopic leg holder with a tourniquet applied.  After prepping and draping in the usual sterile fashion appropriate patient identification and timeout procedures were completed.  An inferior lateral portal was made and arthroscope introduced showing moderate patellofemoral degenerative changes on both patella and trochlea and the patella was more the distal pole and the trochlea over the entire surface.  Going down medially there is a thick medial plica band which impinged on the medial femoral condyle which was subsequently ablated with electrocautery.  Coming into the medial compartment and inferior medial portal was made on probing there was a tear of the anterior medial meniscus that could be brought into the joint on probing posteriorly the meniscus was intact.  The articular cartilage had areas of fibrillation and partial cartilage loss.  The ACL was intact going to the lateral compartment there is a complex tear of the anterior lateral meniscus with predominantly  horizontal cleavage although there was some vertical component on probing the ArthroCare wand was used to ablate this back and get to the cyst I do think that the cyst was opened up through this tear getting to the capsule.  The articular cartilage in the lateral compartment was relatively normal after addressing the lateral meniscus tear that while the plica band was addressed the scope was switched to the other side to make sure we had adequately addressed the medial but rather the lateral pathology on probing there did not appear to be any more cyst fluid present.  The knee was thoroughly irrigated and instrumentation withdrawn.  30 cc of half percent Sensorcaine infiltrated into the area of the portals for postop analgesia followed by closure of the incisions with a single 4-0 nylon simple interrupted suture to each portal.  Xeroform 4 x 4's web roll and Ace wrap then applied.  PLAN OF CARE: Discharge to home after PACU  PATIENT DISPOSITION:  PACU - hemodynamically stable.

## 2019-01-03 ENCOUNTER — Telehealth: Payer: Self-pay | Admitting: Urology

## 2019-01-03 NOTE — Telephone Encounter (Signed)
Patient was seen by her GYN for possible UTI given Cipro and states that she is having pain in her lower back and is not sure if it is a kidney stone? She was told to follow up with [place]? Should she see [place] or wait until she has had a chance to be on the ABX? She just started taking it yesterday.    Sharyn Lull

## 2019-01-03 NOTE — Telephone Encounter (Signed)
She does have a history of kidney stones. We could get a KUB and have her see Sam.

## 2019-01-04 ENCOUNTER — Other Ambulatory Visit: Payer: Self-pay | Admitting: Family Medicine

## 2019-01-04 DIAGNOSIS — R109 Unspecified abdominal pain: Secondary | ICD-10-CM

## 2019-01-04 NOTE — Telephone Encounter (Signed)
Ok can you put an order in for th KUB please? I will call her back   Thanks, Sharyn Lull

## 2019-01-04 NOTE — Telephone Encounter (Signed)
KUB order in.

## 2019-03-30 ENCOUNTER — Other Ambulatory Visit: Payer: Self-pay

## 2019-03-30 ENCOUNTER — Ambulatory Visit
Admission: RE | Admit: 2019-03-30 | Discharge: 2019-03-30 | Disposition: A | Discharge status: Home / Self Care | Payer: BC Managed Care – PPO | Attending: Physician Assistant | Admitting: Physician Assistant

## 2019-03-30 ENCOUNTER — Encounter: Payer: Self-pay | Admitting: Physician Assistant

## 2019-03-30 ENCOUNTER — Ambulatory Visit
Admission: RE | Admit: 2019-03-30 | Discharge: 2019-03-30 | Disposition: A | Discharge status: Home / Self Care | Payer: BC Managed Care – PPO | Source: Ambulatory Visit | Attending: Physician Assistant | Admitting: Physician Assistant

## 2019-03-30 ENCOUNTER — Ambulatory Visit (INDEPENDENT_AMBULATORY_CARE_PROVIDER_SITE_OTHER): Payer: BC Managed Care – PPO | Admitting: Physician Assistant

## 2019-03-30 VITALS — BP 101/69 | HR 66 | Ht 66.0 in | Wt 202.0 lb

## 2019-03-30 DIAGNOSIS — R109 Unspecified abdominal pain: Secondary | ICD-10-CM

## 2019-03-30 DIAGNOSIS — G43909 Migraine, unspecified, not intractable, without status migrainosus: Secondary | ICD-10-CM | POA: Insufficient documentation

## 2019-03-30 DIAGNOSIS — R350 Frequency of micturition: Secondary | ICD-10-CM

## 2019-03-30 DIAGNOSIS — N3001 Acute cystitis with hematuria: Secondary | ICD-10-CM | POA: Diagnosis not present

## 2019-03-30 DIAGNOSIS — F329 Major depressive disorder, single episode, unspecified: Secondary | ICD-10-CM | POA: Insufficient documentation

## 2019-03-30 DIAGNOSIS — F32A Depression, unspecified: Secondary | ICD-10-CM | POA: Insufficient documentation

## 2019-03-30 DIAGNOSIS — F419 Anxiety disorder, unspecified: Secondary | ICD-10-CM | POA: Insufficient documentation

## 2019-03-30 LAB — MICROSCOPIC EXAMINATION

## 2019-03-30 LAB — BLADDER SCAN AMB NON-IMAGING

## 2019-03-30 LAB — URINALYSIS, COMPLETE
Bilirubin, UA: NEGATIVE
Glucose, UA: NEGATIVE
Ketones, UA: NEGATIVE
Nitrite, UA: NEGATIVE
Protein,UA: NEGATIVE
Specific Gravity, UA: 1.025 (ref 1.005–1.030)
Urobilinogen, Ur: 1 mg/dL (ref 0.2–1.0)
pH, UA: 6.5 (ref 5.0–7.5)

## 2019-03-30 MED ORDER — NITROFURANTOIN MONOHYD MACRO 100 MG PO CAPS: 100.0000 mg | ORAL_CAPSULE | Freq: Two times a day (BID) | ORAL | 0 refills | Status: DC

## 2019-03-30 NOTE — Progress Notes (Signed)
03/30/2019 9:24 AM   Sharon [name] 11-13-1960 AC:[id number]  CC: Urinary pressure, frequency, back pain  HPI: Sharon [name] is a 58 y.o. female who presents today for evaluation of possible UTI vs. acute stone episode. She is an established [facility] patient who last saw Dr. Bernardo Heater on 01/25/2018 for back pain with a history of recurrent stone disease with associated sepsis.  CT stone study at that time revealed no renal or ureteral calculi.  She did have a Klebsiella-positive urine culture and was treated with cefuroxime.  She called the office approximately 3 months ago with reports of low back pain with concern for possible kidney stone.  She was treated by her GYN for possible UTI with Cipro in response to the symptoms.  KUB was ordered, however she was never seen in clinic for this. KUB not performed.  Today, she reports an approximate 6-week history of left low back pain at the site of a prior kidney stone. She states the pain is intermittent, at a maximum intensity of 5/10. She has taken Tylenol for symptom palliation without relief of her symptoms. She denies fever, chills, nausea, vomiting, dysuria, and gross hematuria.  PMH significant for gastric bypass in December 2017, with two notable urological hospitalizations in February 2016 (19mm left UPJ stone with urosepsis) and June 2018 (left pyelonephritis with probable stone passage prior to imaging).  In-office UA today positive for trace-intact blood and 1+ leukocyte esterase; urine microscopy with 11-30 WBCs/HPF, 3-10 RBCs/HPF, and moderate bacteria. PVR 63mL.  She is allergic to sulfa antibiotics, penicillin, and clarithromycin.  PMH: Past Medical History:  Diagnosis Date  . Acid reflux    H/O PRIOR TO GASTRC BYPASS  . Anemia    H/O  . Anxiety   . Arthritis   . Bladder spasm   . Depression   . Headache    H/O MIGRAINES  . History of kidney stones   . Left ureteral stone   . Leg DVT (deep venous thromboembolism), acute  (HCC)    x 2-both times after a surgery-went to see hematologist 2017 (Dr Grayland Ormond)  and no clotting disorder ever found  . Restless leg syndrome   . Sepsis ([place])    FROM KIDNEY STONES  . Sleep apnea    HAD GASTRIC BYPASS AND DOES NOT HAVE SLEEP APNEA  . Urge incontinence     Surgical History: Past Surgical History:  Procedure Laterality Date  . ABDOMINAL HYSTERECTOMY     TOTAL ABDOMINAL HYSTERECTOMY W/ BILATERAL SALPINGOOPHORECTOMY  . BARIATRIC SURGERY  05/15/2016  . CHOLECYSTECTOMY    . ESOPHAGOGASTRODUODENOSCOPY  05/15/2016  . KIDNEY STONE SURGERY    . KNEE ARTHROSCOPY Right    X 2  . KNEE ARTHROSCOPY Right 11/11/2018   Procedure: Partial right knee arthroscopy,arthroscopic medial and lateral menisectomy, plica excision;  Surgeon: Hessie Knows, MD;  Location: [facility];  Service: Orthopedics;  Laterality: Right;  . KNEE SURGERY Left 2006  . SHOULDER ARTHROSCOPY WITH OPEN ROTATOR CUFF REPAIR Right 11/22/2015   Procedure: SHOULDER ARTHROSCOPY, DEBRIDEMENT, DECOMPRESSION, SLAP REPAIR, POSSIBLE BICEP TENODESIS;  Surgeon: Corky Mull, MD;  Location: [facility];  Service: Orthopedics;  Laterality: Right;  . SINUS EXPLORATION      Home Medications:  Allergies as of 03/30/2019      Reactions   Alcohol Other (See Comments)   wool   Bacitracin-neomycin-polymyxin Other (See Comments)   Codeine Nausea Only, Other (See Comments)   "headache"   Hydrocodone Other (See Comments)   Hydrocortisone Other (  See Comments)   Lanolin Other (See Comments)   "rash" Other reaction(s): Unknown   Other Other (See Comments)   Bitartrate/black rubber dye/elastic   Tape Other (See Comments)   Other reaction(s): Unknown-OK TO USE PAPER TAPE   Tapentadol Other (See Comments)   Tramadol Other (See Comments)   "headache"   Bacitracin Rash   Clarithromycin Rash   Latex Rash   Nickel Rash, Other (See Comments)   Oxycodone Rash   Penicillins Rash   Has patient had a PCN reaction causing immediate  rash, facial/tongue/throat swelling, SOB or lightheadedness with hypotension: yes Has patient had a PCN reaction causing severe rash involving mucus membranes or skin necrosis: no Has patient had a PCN reaction that required hospitalization No Has patient had a PCN reaction occurring within the last 10 years: No If all of the above answers are "NO", then may proceed with Cephalosporin use.   Sulfamethoxazole-trimethoprim Rash      Medication List       Accurate as of March 30, 2019  9:24 AM. If you have any questions, ask your nurse or doctor.        acetaminophen 325 MG tablet Commonly known as: TYLENOL Take 650 mg by mouth every 6 (six) hours as needed for moderate pain.   apixaban 5 MG Tabs tablet Commonly known as: Eliquis Take 1 tablet (5 mg total) by mouth 2 (two) times daily.   EpiPen 2-Pak 0.3 mg/0.3 mL Soaj injection Generic drug: EPINEPHrine Inject 0.3 mg into the muscle as needed for anaphylaxis.   fluticasone 50 MCG/ACT nasal spray Commonly known as: FLONASE Place 2 sprays into both nostrils daily as needed for allergies.   levocetirizine 5 MG tablet Commonly known as: XYZAL Take 5 mg by mouth every evening.   montelukast 10 MG tablet Commonly known as: SINGULAIR Take 10 mg by mouth at bedtime.   morphine 15 MG tablet Commonly known as: MSIR Take 1 tablet (15 mg total) by mouth every 4 (four) hours as needed for severe pain.   multivitamin tablet Take 1 tablet by mouth daily.       Allergies:  Allergies  Allergen Reactions  . Alcohol Other (See Comments)    wool  . Bacitracin-Neomycin-Polymyxin Other (See Comments)  . Codeine Nausea Only and Other (See Comments)    "headache"  . Hydrocodone Other (See Comments)  . Hydrocortisone Other (See Comments)  . Lanolin Other (See Comments)    "rash" Other reaction(s): Unknown  . Other Other (See Comments)    Bitartrate/black rubber dye/elastic  . Tape Other (See Comments)    Other reaction(s):  Unknown-OK TO USE PAPER TAPE  . Tapentadol Other (See Comments)  . Tramadol Other (See Comments)    "headache"  . Bacitracin Rash  . Clarithromycin Rash  . Latex Rash  . Nickel Rash and Other (See Comments)  . Oxycodone Rash  . Penicillins Rash    Has patient had a PCN reaction causing immediate rash, facial/tongue/throat swelling, SOB or lightheadedness with hypotension: yes Has patient had a PCN reaction causing severe rash involving mucus membranes or skin necrosis: no Has patient had a PCN reaction that required hospitalization No Has patient had a PCN reaction occurring within the last 10 years: No If all of the above answers are "NO", then may proceed with Cephalosporin use.   . Sulfamethoxazole-Trimethoprim Rash    Family History: Family History  Problem Relation Age of Onset  . Cancer Mother   . Lung cancer Father   .  Hematuria Brother   . Kidney disease Paternal Grandfather   . Kidney Stones Brother   . Bladder Cancer Neg Hx   . Kidney cancer Neg Hx     Social History:   reports that she has never smoked. She has never used smokeless tobacco. She reports that she does not drink alcohol or use drugs.  ROS: UROLOGY Frequent Urination?: Yes Hard to postpone urination?: Yes Burning/pain with urination?: No Get up at night to urinate?: Yes Leakage of urine?: No Urine stream starts and stops?: No Trouble starting stream?: Yes Do you have to strain to urinate?: No Blood in urine?: No Urinary tract infection?: No Sexually transmitted disease?: No Injury to kidneys or bladder?: No Painful intercourse?: No Weak stream?: No Currently pregnant?: No Vaginal bleeding?: No Last menstrual period?: n  Gastrointestinal Nausea?: No Vomiting?: No Indigestion/heartburn?: No Diarrhea?: No Constipation?: No  Constitutional Fever: No Night sweats?: No Weight loss?: No Fatigue?: No  Skin Skin rash/lesions?: No Itching?: No  Eyes Blurred vision?: No Double  vision?: No  Ears/Nose/Throat Sore throat?: No Sinus problems?: No  Hematologic/Lymphatic Swollen glands?: No Easy bruising?: No  Cardiovascular Leg swelling?: No Chest pain?: No  Respiratory Cough?: No Shortness of breath?: No  Endocrine Excessive thirst?: No  Musculoskeletal Back pain?: Yes Joint pain?: No  Neurological Headaches?: No Dizziness?: No  Psychologic Depression?: No Anxiety?: No  Physical Exam: BP 101/69   Pulse 66   Ht 5\' 6"  (1.676 m)   Wt 202 lb (91.6 kg)   BMI 32.60 kg/m   Constitutional:  Alert and oriented, no acute distress, nontoxic appearing HEENT: [place], AT Cardiovascular: No clubbing, cyanosis, or edema Respiratory: Normal respiratory effort, no increased work of breathing Skin: No rashes, bruises or suspicious lesions Neurologic: Grossly intact, no focal deficits, moving all 4 extremities Psychiatric: Normal mood and affect  Laboratory Data: Results for orders placed or performed in visit on 03/30/19  Microscopic Examination   URINE  Result Value Ref Range   WBC, UA 11-30 (A) 0 - 5 /hpf   RBC 3-10 (A) 0 - 2 /hpf   Epithelial Cells (non renal) 0-10 0 - 10 /hpf   Mucus, UA Present (A) Not Estab.   Bacteria, UA Moderate (A) None seen/Few  Urinalysis, Complete  Result Value Ref Range   Specific Gravity, UA 1.025 1.005 - 1.030   pH, UA 6.5 5.0 - 7.5   Color, UA Yellow Yellow   Appearance Ur Cloudy (A) Clear   Leukocytes,UA 1+ (A) Negative   Protein,UA Negative Negative/Trace   Glucose, UA Negative Negative   Ketones, UA Negative Negative   RBC, UA Trace (A) Negative   Bilirubin, UA Negative Negative   Urobilinogen, Ur 1.0 0.2 - 1.0 mg/dL   Nitrite, UA Negative Negative   Microscopic Examination See below:   Bladder Scan (Post Void Residual) in office  Result Value Ref Range   Scan Result 58mL    Pertinent Imaging: KUB 03/30/2019: CLINICAL DATA:  Nephrolithiasis, flank pain.  EXAM: ABDOMEN - 1 VIEW  COMPARISON:   January 27, 2018.  January 25, 2018.  FINDINGS: The bowel gas pattern is normal. No radio-opaque calculi or other significant radiographic abnormality are seen. Status post cholecystectomy.  IMPRESSION: No definite evidence of nephrolithiasis. No evidence of bowel obstruction or ileus.   Electronically Signed   By: Marijo Conception M.D.   On: 03/30/2019 11:34  I personally reviewed the above imaging and note the absence of radiopaque calculi concerning for urolithiasis. Also with  some phleboliths, stable compared to prior imaging.  Assessment & Plan:   1. Urinary frequency 58 year old female with a history of urosepsis associated with nephrolithiasis presents with a 6-week history of intermittent left low back pain with concerns for stone.  UA significant for pyuria and microscopic hematuria.  Differential includes acute cystitis versus possible infected kidney stone, given her history.  Given duration of symptoms, stable vitals in clinic today, and nontoxic appearance, I do not believe she requires urgent intervention at this time.  Patient obtained KUB for evaluation of possible stone after our visit. No stones visualized. Given this clinical picture, I suspect urinary tract infection.  Will send for culture.  PVR WNL, I am not concerned for retention at this time. - Bladder Scan (Post Void Residual) in office - CULTURE, URINE COMPREHENSIVE - Urinalysis, Complete  2. Acute cystitis with hematuria Starting patient on empiric nitrofurantoin today.  She reports a urinary tract infection treated by her PCP approximately 5 weeks ago, however notes are unavailable and she does not recall which antibiotic she was given but believes it was a sulfa medication.    She had some microscopic hematuria today and will need to return to the clinic for repeat UA to prove resolution of this following antibiotic treatment.  Counseled patient to message me in 1 week with an update on her  symptoms.  If she continues to be symptomatic despite culture appropriate therapy, will order renal ultrasound for further evaluation of possible stone. - CULTURE, URINE COMPREHENSIVE; Future - Urinalysis, Complete; Future  - nitrofurantoin, macrocrystal-monohydrate, (MACROBID) 100 MG capsule; Take 1 capsule (100 mg total) by mouth every 12 (twelve) hours for 5 days.  Dispense: 10 capsule; Refill: 0   Return in 1 week (on 04/06/2019) for Lab visit for UA.  Debroah Loop, PA-C  [facility] [address], [place], [place] [address] [phone]

## 2019-04-03 LAB — CULTURE, URINE COMPREHENSIVE

## 2019-04-04 ENCOUNTER — Telehealth: Payer: Self-pay | Admitting: Physician Assistant

## 2019-04-04 ENCOUNTER — Other Ambulatory Visit: Payer: Self-pay | Admitting: Physician Assistant

## 2019-04-04 DIAGNOSIS — Z87442 Personal history of urinary calculi: Secondary | ICD-10-CM

## 2019-04-04 DIAGNOSIS — N3001 Acute cystitis with hematuria: Secondary | ICD-10-CM

## 2019-04-04 MED ORDER — NITROFURANTOIN MONOHYD MACRO 100 MG PO CAPS: 100.0000 mg | ORAL_CAPSULE | Freq: Two times a day (BID) | ORAL | 0 refills | Status: AC

## 2019-04-04 NOTE — Telephone Encounter (Signed)
I just spoke with the patient via telephone. She reports fatigue and continued left low back pain. She denies, fevers, chills, nausea, and vomiting. Extending her culture-appropriate Macrobid for 3 more days, ordered STAT renal ultrasound for evaluation of possible left sided kidney stone.  Patient's last CT stone study from September 2019 without visible stones. I offered her a repeat CT today, however I explained that this study would expose her to radiation that I do not believe is necessary. She is amenable to proceeding with ultrasound. I am in agreement with that plan.

## 2019-04-04 NOTE — Telephone Encounter (Signed)
This is Sam's patient.

## 2019-04-04 NOTE — Telephone Encounter (Signed)
Pt LMOM requesting a call back for UA results done on 03/30/2019.

## 2019-04-04 NOTE — Telephone Encounter (Signed)
I just returned patient's call, with plans to inform her that her urine culture was positive.  Macrobid is the appropriate antibiotic for her current infection.  Remains symptomatic of intermittent left low back pain, will order renal ultrasound for further evaluation of possible stone.  LMOM to return my call.

## 2019-04-06 ENCOUNTER — Other Ambulatory Visit: Payer: Self-pay

## 2019-04-06 ENCOUNTER — Ambulatory Visit
Admission: RE | Admit: 2019-04-06 | Discharge: 2019-04-06 | Disposition: A | Discharge status: Home / Self Care | Payer: BC Managed Care – PPO | Source: Ambulatory Visit | Attending: Physician Assistant | Admitting: Physician Assistant

## 2019-04-06 DIAGNOSIS — Z87442 Personal history of urinary calculi: Secondary | ICD-10-CM | POA: Insufficient documentation

## 2019-04-07 ENCOUNTER — Telehealth: Payer: Self-pay | Admitting: Physician Assistant

## 2019-04-07 NOTE — Telephone Encounter (Signed)
I just spoke with the patient via telephone to report the results of her renal US. I explained that there were no stones visualized but several simple-appearing cysts not requiring follow-up. I do not believe she has a kidney stone to explain her recent pain.  She reports finishing her prescribed antibiotic this morning and states her pain has improved. I scheduled her for lab visit tomorrow morning for repeat UA to prove resolution of her MH. She expressed understanding.

## 2019-04-08 ENCOUNTER — Other Ambulatory Visit: Payer: Self-pay

## 2019-04-08 ENCOUNTER — Other Ambulatory Visit: Payer: BC Managed Care – PPO

## 2019-04-08 ENCOUNTER — Telehealth: Payer: Self-pay | Admitting: Physician Assistant

## 2019-04-08 DIAGNOSIS — N3001 Acute cystitis with hematuria: Secondary | ICD-10-CM

## 2019-04-08 LAB — URINALYSIS, COMPLETE
Bilirubin, UA: NEGATIVE
Glucose, UA: NEGATIVE
Ketones, UA: NEGATIVE
Nitrite, UA: NEGATIVE
Protein,UA: NEGATIVE
RBC, UA: NEGATIVE
Specific Gravity, UA: 1.025 (ref 1.005–1.030)
Urobilinogen, Ur: 1 mg/dL (ref 0.2–1.0)
pH, UA: 6.5 (ref 5.0–7.5)

## 2019-04-08 LAB — MICROSCOPIC EXAMINATION: RBC, Urine: NONE SEEN /hpf (ref 0–2)

## 2019-04-08 NOTE — Telephone Encounter (Signed)
I just spoke with the patient via telephone to report the results of her repeat UA this morning.  No microscopic hematuria today.  I explained that the [place] discovered last week was likely associated with her infection, which is now been treated.  She does not require follow-up of this.  We will continue to see her as needed.  She expressed understanding.

## 2019-04-11 LAB — CULTURE, URINE COMPREHENSIVE

## 2019-04-15 ENCOUNTER — Encounter: Payer: Self-pay | Admitting: Emergency Medicine

## 2019-04-15 ENCOUNTER — Other Ambulatory Visit: Payer: Self-pay

## 2019-04-15 ENCOUNTER — Ambulatory Visit
Admission: EM | Admit: 2019-04-15 | Discharge: 2019-04-15 | Disposition: A | Discharge status: Home / Self Care | Payer: BC Managed Care – PPO | Attending: Family Medicine | Admitting: Family Medicine

## 2019-04-15 DIAGNOSIS — R519 Headache, unspecified: Secondary | ICD-10-CM

## 2019-04-15 DIAGNOSIS — H6692 Otitis media, unspecified, left ear: Secondary | ICD-10-CM | POA: Diagnosis not present

## 2019-04-15 DIAGNOSIS — Z7189 Other specified counseling: Secondary | ICD-10-CM

## 2019-04-15 DIAGNOSIS — R0981 Nasal congestion: Secondary | ICD-10-CM | POA: Diagnosis not present

## 2019-04-15 MED ORDER — CEFDINIR 300 MG PO CAPS: 300.0000 mg | ORAL_CAPSULE | Freq: Two times a day (BID) | ORAL | 0 refills | Status: AC

## 2019-04-15 NOTE — Discharge Instructions (Signed)
Take medication as prescribed. Rest. Drink plenty of fluids.  ° °Follow up with your primary care physician this week as needed. Return to Urgent care for new or worsening concerns.  ° °

## 2019-04-15 NOTE — ED Triage Notes (Signed)
Patient in today with a 4 day history of sinus congestion, green mucous drainage, headache and swollen lymph nodes. Patient denies fever. Patient thinks she has a sinus infection, but is also concerned with Covid.

## 2019-04-15 NOTE — ED Provider Notes (Signed)
[facility] ____________________________________________  Time seen: Approximately 10:16 AM  I have reviewed the triage vital signs and the nursing notes.   HISTORY  Chief Complaint Nasal Congestion and Headache   HPI Sharon [name] is a 58 y.o. female presenting for evaluation of 4 days of runny nose, nasal congestion, sinus pressure.  Denies cough, chest pain, shortness of breath, vomiting, diarrhea or changes in taste or smell.  Reports greenish nasal drainage.  Also reports left ear discomfort.  Has been taking Tylenol Cold as well as Mucinex with some improvement, no resolution.  Denies known direct sick contacts.  Reports otherwise doing well.  Ricardo Jericho, NP : PCP   Past Medical History:  Diagnosis Date  . Acid reflux    H/O PRIOR TO GASTRC BYPASS  . Anemia    H/O  . Anxiety   . Arthritis   . Bladder spasm   . Depression   . Headache    H/O MIGRAINES  . History of kidney stones   . Left ureteral stone   . Leg DVT (deep venous thromboembolism), acute (HCC)    x 2-both times after a surgery-went to see hematologist 2017 (Dr Grayland Ormond)  and no clotting disorder ever found  . Restless leg syndrome   . Sepsis ([place])    FROM KIDNEY STONES  . Sleep apnea    HAD GASTRIC BYPASS AND DOES NOT HAVE SLEEP APNEA  . Urge incontinence     Patient Active Problem List   Diagnosis Date Noted  . Anxiety 03/30/2019  . Depression 03/30/2019  . Migraine 03/30/2019  . Marginal ulcer 03/24/2017  . S/P gastric bypass 03/24/2017  . Sepsis ([place]) 11/15/2016  . Hydronephrosis 11/15/2016  . GERD (gastroesophageal reflux disease) 11/15/2016  . UTI (urinary tract infection) 11/15/2016  . Right leg DVT ([place]) 01/24/2016  . Acute deep vein thrombosis (DVT) of popliteal vein of right lower extremity ([place]) 01/04/2016  . Incomplete tear of right rotator cuff 11/22/2015  . Moderate obstructive sleep apnea 09/21/2015  . Kidney stones 09/10/2015  . Urge  incontinence 09/10/2015  . Nocturia 09/10/2015  . Joint pain 09/05/2015  . S/P laparoscopic cholecystectomy 09/05/2015  . Paralabral cyst of shoulder, right, subsequent encounter 01/10/2015  . Rotator cuff tendinitis, right 01/10/2015  . Tear of right glenoid labrum 01/10/2015  . Acute ankle pain 11/16/2013  . Acute shoulder pain 11/16/2013    Past Surgical History:  Procedure Laterality Date  . ABDOMINAL HYSTERECTOMY     TOTAL ABDOMINAL HYSTERECTOMY W/ BILATERAL SALPINGOOPHORECTOMY  . BARIATRIC SURGERY  05/15/2016  . CHOLECYSTECTOMY    . ESOPHAGOGASTRODUODENOSCOPY  05/15/2016  . KIDNEY STONE SURGERY    . KNEE ARTHROSCOPY Right    X 2  . KNEE ARTHROSCOPY Right 11/11/2018   Procedure: Partial right knee arthroscopy,arthroscopic medial and lateral menisectomy, plica excision;  Surgeon: Hessie Knows, MD;  Location: [facility];  Service: Orthopedics;  Laterality: Right;  . KNEE SURGERY Left 2006  . SHOULDER ARTHROSCOPY WITH OPEN ROTATOR CUFF REPAIR Right 11/22/2015   Procedure: SHOULDER ARTHROSCOPY, DEBRIDEMENT, DECOMPRESSION, SLAP REPAIR, POSSIBLE BICEP TENODESIS;  Surgeon: Corky Mull, MD;  Location: [facility];  Service: Orthopedics;  Laterality: Right;  . SINUS EXPLORATION       No current facility-administered medications for this encounter.   Current Outpatient Medications:  .  acetaminophen (TYLENOL) 325 MG tablet, Take 650 mg by mouth every 6 (six) hours as needed for moderate pain., Disp: , Rfl:  .  EPINEPHrine (EPIPEN 2-PAK) 0.3 mg/0.3  mL IJ SOAJ injection, Inject 0.3 mg into the muscle as needed for anaphylaxis. , Disp: , Rfl:  .  fluticasone (FLONASE) 50 MCG/ACT nasal spray, Place 2 sprays into both nostrils daily as needed for allergies. , Disp: , Rfl:  .  levocetirizine (XYZAL) 5 MG tablet, Take 5 mg by mouth every evening., Disp: , Rfl:  .  montelukast (SINGULAIR) 10 MG tablet, Take 10 mg by mouth at bedtime., Disp: , Rfl:  .  Multiple Vitamin (MULTIVITAMIN) tablet, Take 1  tablet by mouth daily., Disp: , Rfl:  .  cefdinir (OMNICEF) 300 MG capsule, Take 1 capsule (300 mg total) by mouth 2 (two) times daily for 10 days., Disp: 20 capsule, Rfl: 0  Allergies Alcohol, Bacitracin-neomycin-polymyxin, Codeine, Hydrocodone, Hydrocortisone, Lanolin, Other, Tape, Tapentadol, Tramadol, Bacitracin, Clarithromycin, Latex, Nickel, Oxycodone, Penicillins, and Sulfamethoxazole-trimethoprim  Family History  Problem Relation Age of Onset  . Cancer Mother        rectal  . Lung cancer Father   . Hematuria Brother   . Kidney disease Paternal Grandfather   . Kidney Stones Brother   . Bladder Cancer Neg Hx   . Kidney cancer Neg Hx     Social History Social History   Tobacco Use  . Smoking status: Never Smoker  . Smokeless tobacco: Never Used  Substance Use Topics  . Alcohol use: No    Alcohol/week: 0.0 standard drinks  . Drug use: No    Review of Systems Constitutional: No fever ENT: No sore throat. Positive congestion.  Cardiovascular: Denies chest pain. Respiratory: Denies shortness of breath. Gastrointestinal: No abdominal pain.  No nausea, no vomiting.  No diarrhea.   Musculoskeletal: Negative for back pain. Skin: Negative for rash.   ____________________________________________   PHYSICAL EXAM:  VITAL SIGNS: ED Triage Vitals  Enc Vitals Group     BP 04/15/19 0915 (!) 119/59     Pulse Rate 04/15/19 0915 70     Resp 04/15/19 0915 18     Temp 04/15/19 0915 98.3 F (36.8 C)     Temp Source 04/15/19 0915 Oral     SpO2 04/15/19 0915 99 %     Weight 04/15/19 0914 193 lb (87.5 kg)     Height 04/15/19 0914 5\' 7"  (1.702 m)     Head Circumference --      Peak Flow --      Pain Score 04/15/19 0914 6     Pain Loc --      Pain Edu? --      Excl. in [place]? --     Constitutional: Alert and oriented. Well appearing and in no acute distress. Eyes: Conjunctivae are normal.  Head: Atraumatic.Mild tenderness to palpation bilateral frontal and maxillary sinuses.  No swelling. No erythema.   Ears:   Left: Nontender, normal canal, moderate erythema and dull TM.  Right: Nontender, normal canal, no erythema, normal TM.  Nose: nasal congestion   Mouth/Throat: Mucous membranes are moist.  Oropharynx non-erythematous.No tonsillar swelling or exudate.  Neck: No stridor.  No cervical spine tenderness to palpation. Hematological/Lymphatic/Immunilogical: No cervical lymphadenopathy. Cardiovascular: Normal rate, regular rhythm. Grossly normal heart sounds.  Good peripheral circulation. Respiratory: Normal respiratory effort.  No retractions. No wheezes, rales or rhonchi. Good air movement.  Musculoskeletal: Steady gait. Neurologic:  Normal speech and language. No gait instability. Skin:  Skin is warm, dry and intact. No rash noted. Psychiatric: Mood and affect are normal. Speech and behavior are normal.  _________________________________________   LABS (all labs ordered are  listed, but only abnormal results are displayed)  Labs Reviewed  NOVEL CORONAVIRUS, NAA (HOSP ORDER, SEND-OUT TO REF LAB; TAT 18-24 HRS)    PROCEDURES Procedures    INITIAL IMPRESSION / [address] / ED COURSE  Pertinent labs & imaging results that were available during my care of the patient were reviewed by me and considered in my medical decision making (see chart for details).  Overall well-appearing patient.  No acute distress.  Suspect viral illness and viral sinusitis.  COVID-19 testing and advice given.  Left otitis media noted.  Will treat with oral cefdinir.  Patient reports has taken cefdinir in the past and tolerated well.  Continue over-the-counter supportive care.Discussed indication, risks and benefits of medications with patient.   Discussed follow up with Primary care physician this week. Discussed follow up and return parameters including no resolution or any worsening concerns. Patient verbalized understanding and agreed to plan.    ____________________________________________   FINAL CLINICAL IMPRESSION(S) / ED DIAGNOSES  Final diagnoses:  Left otitis media, unspecified otitis media type  Advice given about COVID-19 virus infection  Nasal congestion     ED Discharge Orders         Ordered    cefdinir (OMNICEF) 300 MG capsule  2 times daily     04/15/19 [id number]           Note: This dictation was prepared with Dragon dictation along with smaller phrase technology. Any transcriptional errors that result from this process are unintentional.         Marylene Land, NP 04/15/19 1019

## 2019-04-16 LAB — NOVEL CORONAVIRUS, NAA (HOSP ORDER, SEND-OUT TO REF LAB; TAT 18-24 HRS): SARS-CoV-2, NAA: NOT DETECTED

## 2019-07-06 ENCOUNTER — Encounter: Payer: Self-pay | Admitting: Urology

## 2019-07-06 ENCOUNTER — Ambulatory Visit (INDEPENDENT_AMBULATORY_CARE_PROVIDER_SITE_OTHER): Payer: BC Managed Care – PPO | Admitting: Urology

## 2019-07-06 ENCOUNTER — Other Ambulatory Visit: Payer: Self-pay | Admitting: Urology

## 2019-07-06 ENCOUNTER — Other Ambulatory Visit: Payer: Self-pay

## 2019-07-06 VITALS — BP 115/79 | HR 88 | Ht 67.0 in | Wt 202.0 lb

## 2019-07-06 DIAGNOSIS — M545 Low back pain: Secondary | ICD-10-CM

## 2019-07-06 DIAGNOSIS — R350 Frequency of micturition: Secondary | ICD-10-CM | POA: Diagnosis not present

## 2019-07-06 DIAGNOSIS — G8929 Other chronic pain: Secondary | ICD-10-CM | POA: Diagnosis not present

## 2019-07-06 LAB — URINALYSIS, COMPLETE
Bilirubin, UA: NEGATIVE
Glucose, UA: NEGATIVE
Ketones, UA: NEGATIVE
Nitrite, UA: NEGATIVE
Protein,UA: NEGATIVE
RBC, UA: NEGATIVE
Specific Gravity, UA: 1.025 (ref 1.005–1.030)
Urobilinogen, Ur: 1 mg/dL (ref 0.2–1.0)
pH, UA: 6 (ref 5.0–7.5)

## 2019-07-06 LAB — MICROSCOPIC EXAMINATION: RBC, Urine: NONE SEEN /hpf (ref 0–2)

## 2019-07-06 NOTE — Progress Notes (Signed)
07/06/2019 10:36 AM   Sharon [name] 14-Jan-1961 [id number]  Referring provider: Ricardo Jericho, NP [address] [place],  [place] [address]  Chief Complaint  Patient presents with  . Back Pain    HPI: 59 year old female who returns today concerned about possible urinary tract infection.  She reports that she has been dealing with low back pain, left greater than right in her buttock area.  No alleviating or exacerbating symptoms.  Unrelated to voiding.  Is been going on for several weeks.  She also had a similar episode back in November when she did in fact have a urinary tract infection.  She was reports that on Sunday, she started to have acute onset urinary urgency frequency.  She almost had an episode of incontinence due to urgency.  She is drinking more coffee with the cold weather.  She denies any dysuria, gross hematuria, or any other symptoms.  She was treated for a urinary tract infection on 03/30/2019, Klebsiella.  12 urine culture was negative.   Urine today is negative.  She is very concerned about kidney stones given her history of urosepsis.  Notably, she had a recent negative CT scan showing no stones on 01/2018 a follow-up renal ultrasound on 04/06/2019 which also showed no evidence of stones.  KUB is also negative.  PMH: Past Medical History:  Diagnosis Date  . Acid reflux    H/O PRIOR TO GASTRC BYPASS  . Anemia    H/O  . Anxiety   . Arthritis   . Bladder spasm   . Depression   . Headache    H/O MIGRAINES  . History of kidney stones   . Left ureteral stone   . Leg DVT (deep venous thromboembolism), acute (HCC)    x 2-both times after a surgery-went to see hematologist 2017 (Dr Grayland Ormond)  and no clotting disorder ever found  . Restless leg syndrome   . Sepsis ([place])    FROM KIDNEY STONES  . Sleep apnea    HAD GASTRIC BYPASS AND DOES NOT HAVE SLEEP APNEA  . Urge incontinence     Surgical History: Past Surgical History:  Procedure  Laterality Date  . ABDOMINAL HYSTERECTOMY     TOTAL ABDOMINAL HYSTERECTOMY W/ BILATERAL SALPINGOOPHORECTOMY  . BARIATRIC SURGERY  05/15/2016  . CHOLECYSTECTOMY    . ESOPHAGOGASTRODUODENOSCOPY  05/15/2016  . KIDNEY STONE SURGERY    . KNEE ARTHROSCOPY Right    X 2  . KNEE ARTHROSCOPY Right 11/11/2018   Procedure: Partial right knee arthroscopy,arthroscopic medial and lateral menisectomy, plica excision;  Surgeon: Hessie Knows, MD;  Location: [facility];  Service: Orthopedics;  Laterality: Right;  . KNEE SURGERY Left 2006  . SHOULDER ARTHROSCOPY WITH OPEN ROTATOR CUFF REPAIR Right 11/22/2015   Procedure: SHOULDER ARTHROSCOPY, DEBRIDEMENT, DECOMPRESSION, SLAP REPAIR, POSSIBLE BICEP TENODESIS;  Surgeon: Corky Mull, MD;  Location: [facility];  Service: Orthopedics;  Laterality: Right;  . SINUS EXPLORATION      Home Medications:  Allergies as of 07/06/2019      Reactions   Alcohol Other (See Comments)   wool   Bacitracin-neomycin-polymyxin Other (See Comments)   Codeine Nausea Only, Other (See Comments)   "headache"   Hydrocodone Other (See Comments)   Hydrocortisone Other (See Comments)   Lanolin Other (See Comments)   "rash" Other reaction(s): Unknown   Other Other (See Comments)   Bitartrate/black rubber dye/elastic   Tape Other (See Comments)   Other reaction(s): Unknown-OK TO USE PAPER TAPE   Tapentadol  Other (See Comments)   Tramadol Other (See Comments)   "headache"   Bacitracin Rash   Clarithromycin Rash   Latex Rash   Nickel Rash, Other (See Comments)   Oxycodone Rash   Penicillins Rash   Has patient had a PCN reaction causing immediate rash, facial/tongue/throat swelling, SOB or lightheadedness with hypotension: yes Has patient had a PCN reaction causing severe rash involving mucus membranes or skin necrosis: no Has patient had a PCN reaction that required hospitalization No Has patient had a PCN reaction occurring within the last 10 years: No If all of the above answers  are "NO", then may proceed with Cephalosporin use.   Sulfamethoxazole-trimethoprim Rash      Medication List       Accurate as of July 06, 2019 10:36 AM. If you have any questions, ask your nurse or doctor.        acetaminophen 325 MG tablet Commonly known as: TYLENOL Take 650 mg by mouth every 6 (six) hours as needed for moderate pain.   EpiPen 2-Pak 0.3 mg/0.3 mL Soaj injection Generic drug: EPINEPHrine Inject 0.3 mg into the muscle as needed for anaphylaxis.   fluticasone 50 MCG/ACT nasal spray Commonly known as: FLONASE Place 2 sprays into both nostrils daily as needed for allergies.   levocetirizine 5 MG tablet Commonly known as: XYZAL Take 5 mg by mouth every evening.   montelukast 10 MG tablet Commonly known as: SINGULAIR Take 10 mg by mouth at bedtime.   multivitamin tablet Take 1 tablet by mouth daily.       Allergies:  Allergies  Allergen Reactions  . Alcohol Other (See Comments)    wool  . Bacitracin-Neomycin-Polymyxin Other (See Comments)  . Codeine Nausea Only and Other (See Comments)    "headache"  . Hydrocodone Other (See Comments)  . Hydrocortisone Other (See Comments)  . Lanolin Other (See Comments)    "rash" Other reaction(s): Unknown  . Other Other (See Comments)    Bitartrate/black rubber dye/elastic  . Tape Other (See Comments)    Other reaction(s): Unknown-OK TO USE PAPER TAPE  . Tapentadol Other (See Comments)  . Tramadol Other (See Comments)    "headache"  . Bacitracin Rash  . Clarithromycin Rash  . Latex Rash  . Nickel Rash and Other (See Comments)  . Oxycodone Rash  . Penicillins Rash    Has patient had a PCN reaction causing immediate rash, facial/tongue/throat swelling, SOB or lightheadedness with hypotension: yes Has patient had a PCN reaction causing severe rash involving mucus membranes or skin necrosis: no Has patient had a PCN reaction that required hospitalization No Has patient had a PCN reaction occurring within  the last 10 years: No If all of the above answers are "NO", then may proceed with Cephalosporin use.   . Sulfamethoxazole-Trimethoprim Rash    Family History: Family History  Problem Relation Age of Onset  . Cancer Mother        rectal  . Lung cancer Father   . Hematuria Brother   . Kidney disease Paternal Grandfather   . Kidney Stones Brother   . Bladder Cancer Neg Hx   . Kidney cancer Neg Hx     Social History:  reports that she has never smoked. She has never used smokeless tobacco. She reports that she does not drink alcohol or use drugs.   Physical Exam: BP 115/79   Pulse 88   Ht 5\' 7"  (1.702 m)   Wt 202 lb (91.6 kg)   BMI  31.64 kg/m   Constitutional:  Alert and oriented, No acute distress. HEENT: [place] AT, moist mucus membranes.  Trachea midline, no masses. Cardiovascular: No clubbing, cyanosis, or edema. Respiratory: Normal respiratory effort, no increased work of breathing. Skin: No rashes, bruises or suspicious lesions. Neurologic: Grossly intact, no focal deficits, moving all 4 extremities. Psychiatric: Normal mood and affect.  Laboratory Data: Lab Results  Component Value Date   WBC 10.8 11/15/2016   HGB 12.8 11/08/2018   HCT 34.5 (L) 11/15/2016   MCV 81.7 11/15/2016   PLT 175 11/15/2016    Lab Results  Component Value Date   CREATININE 0.45 11/17/2016   Urinalysis Microscopic urinalysis today negative, no evidence of WBCs or RBCs.  Nitrate negative on dip.+  Pertinent Imaging: Results for orders placed during the hospital encounter of 03/30/19  Abdomen 1 view (KUB)   Narrative CLINICAL DATA:  Nephrolithiasis, flank pain.  EXAM: ABDOMEN - 1 VIEW  COMPARISON:  January 27, 2018.  January 25, 2018.  FINDINGS: The bowel gas pattern is normal. No radio-opaque calculi or other significant radiographic abnormality are seen. Status post cholecystectomy.  IMPRESSION: No definite evidence of nephrolithiasis. No evidence of bowel obstruction or  ileus.   Electronically Signed   By: Marijo Conception M.D.   On: 03/30/2019 11:34     Results for orders placed in visit on 04/04/19  US RENAL   Narrative CLINICAL DATA:  Left flank pain.  History of nephrolithiasis.  EXAM: RENAL / URINARY TRACT ULTRASOUND COMPLETE  COMPARISON:  Ultrasound 08/24/2014.  CT 01/27/2018  FINDINGS: Right Kidney:  Renal measurements: 10.6 x 5.0 x 6.0 cm = volume: 164 mL . Echogenicity within normal limits. No mass or hydronephrosis visualized.  Left Kidney:  Renal measurements: 10.2 x 5.0 x 5.2 cm = volume: 139 mL. Echogenicity within normal limits. No hydronephrosis. Small exophytic hypoechoic structure in the left kidney measures up to 0.9 cm and suggestive for a cyst. There is another hypoechoic structure in the mid left kidney that corresponds with a hypodensity on the previous CT. This structure measures 1.9 x 1.3 x 1.9 cm and likely represents a cyst.  Bladder:  Appears normal for degree of bladder distention.  Other:  None.  IMPRESSION: 1. Negative for hydronephrosis. 2. Hypoechoic structures in left kidney are most compatible with cysts.   Electronically Signed   By: Markus Daft M.D.   On: 04/06/2019 08:57    Assessment & Plan:    1. Chronic midline low back pain without sciatica Based on the nature and location of her pain of her pain, seems unrelated to genitourinary issues especially in the setting of recent unremarkable KUB and renal ultrasound - Urinalysis, Complete  2. Urinary frequency / urgency Given the acuity of her symptoms, suspect highly that this is behavioral related secondary to increased coffee intake  I have asked her to cut back on her caffeine/coffee intake and primarily try to drink water, avoidance of beverages before bedtime, etc.  No issues with constipation.  No evidence of UTIs exacerbating factor  We did briefly discuss the option of pharmacotherapy for OAB type symptoms.  We discussed  Ditropan today as well as side effects of the dry eyes dry mouth and constipation.  I urged her to call us back at the end of the week if her symptoms persist and we can call in this medication.  This point time, would like her to implement behavioral modification with prior to starting medication.   F/u prn  Hollice Espy, MD  [facility] [address], [place], [place] [address] [phone]

## 2019-07-06 NOTE — Addendum Note (Signed)
Addended by: Verlene Mayer A on: 07/06/2019 03:44 PM   Modules accepted: Orders

## 2019-07-08 ENCOUNTER — Telehealth: Payer: Self-pay | Admitting: *Deleted

## 2019-07-08 MED ORDER — NITROFURANTOIN MONOHYD MACRO 100 MG PO CAPS: 100.0000 mg | ORAL_CAPSULE | Freq: Two times a day (BID) | ORAL | 0 refills | Status: DC

## 2019-07-08 NOTE — Telephone Encounter (Addendum)
Patient returned call, explained in detail regarding final culture. Voiced understanding.   ----- Message from Hollice Espy, MD sent at 07/08/2019  9:47 AM EST ----- Please let this patient know that the prelim is growing gram-negative rods suggestive of infection.  We will have the finalized culture until over the weekend.  Like to go get her started on Macrobid 100 mg twice daily for 7 days.  We will let her know Monday if he did change this.  Hollice Espy, MD

## 2019-07-09 LAB — CULTURE, URINE COMPREHENSIVE

## 2019-07-11 ENCOUNTER — Telehealth: Payer: Self-pay | Admitting: *Deleted

## 2019-07-11 NOTE — Telephone Encounter (Addendum)
Patient informed, voiced understanding.   ----- Message from Hollice Espy, MD sent at 07/09/2019  4:49 PM EST ----- Urine culture grew Klebsiella.  Macrobid should be sufficient.  Let us know if her symptoms fail to improve.  Hollice Espy, MD

## 2019-10-25 ENCOUNTER — Encounter: Payer: Self-pay | Admitting: Physician Assistant

## 2019-10-25 ENCOUNTER — Other Ambulatory Visit: Payer: Self-pay

## 2019-10-25 ENCOUNTER — Ambulatory Visit (INDEPENDENT_AMBULATORY_CARE_PROVIDER_SITE_OTHER): Payer: BC Managed Care – PPO | Admitting: Physician Assistant

## 2019-10-25 VITALS — BP 112/72 | HR 81 | Ht 66.0 in | Wt 198.0 lb

## 2019-10-25 DIAGNOSIS — R3915 Urgency of urination: Secondary | ICD-10-CM | POA: Diagnosis not present

## 2019-10-25 DIAGNOSIS — Z87440 Personal history of urinary (tract) infections: Secondary | ICD-10-CM

## 2019-10-25 LAB — URINALYSIS, COMPLETE
Bilirubin, UA: NEGATIVE
Glucose, UA: NEGATIVE
Ketones, UA: NEGATIVE
Nitrite, UA: POSITIVE — AB
Protein,UA: NEGATIVE
RBC, UA: NEGATIVE
Specific Gravity, UA: 1.025 (ref 1.005–1.030)
Urobilinogen, Ur: 1 mg/dL (ref 0.2–1.0)
pH, UA: 6 (ref 5.0–7.5)

## 2019-10-25 LAB — MICROSCOPIC EXAMINATION: WBC, UA: >30 /hpf — AB (ref 0–5)

## 2019-10-25 MED ORDER — TROSPIUM CHLORIDE 20 MG PO TABS: 20.0000 mg | ORAL_TABLET | Freq: Two times a day (BID) | ORAL | 0 refills | Status: DC

## 2019-10-25 MED ORDER — NITROFURANTOIN MONOHYD MACRO 100 MG PO CAPS: 100.0000 mg | ORAL_CAPSULE | Freq: Two times a day (BID) | ORAL | 0 refills | Status: DC

## 2019-10-25 NOTE — Patient Instructions (Signed)

## 2019-10-25 NOTE — Progress Notes (Signed)
10/25/2019 9:49 AM   Sharon [name] 1960/10/06 [id number]  CC: Chief Complaint  Patient presents with  . Urinary Frequency    HPI: Sharon [name] is a 59 y.o. female with PMH recurrent nephrolithiasis with urosepsis, urgency, and frequency who presents today for evaluation of possible UTI. She is an established [facility] patient who last saw Dr. Erlene Quan on 07/06/2019 for the same.  Urine culture grew ampicillin resistant Klebsiella pneumoniae; she was treated with Macrobid 100 mg twice daily x7 days.  Today, patient reports a weeks long history of worsened urgency and frequency as well as new twice daily urge incontinence, consistent with prior positive urine culture.  She is voiding 5-6 times daily and reports nocturia x2.  She states at baseline she has urgency and frequency, however without leakage.  She denies dysuria.  She is s/p hysterectomy.  No history of dry mouth or constipation.  In-office UA today positive for nitrites and 1+ leukocyte esterase; urine microscopy with >30 WBCs/HPF and many bacteria.  PMH: Past Medical History:  Diagnosis Date  . Acid reflux    H/O PRIOR TO GASTRC BYPASS  . Anemia    H/O  . Anxiety   . Arthritis   . Bladder spasm   . Depression   . Headache    H/O MIGRAINES  . History of kidney stones   . Left ureteral stone   . Leg DVT (deep venous thromboembolism), acute (HCC)    x 2-both times after a surgery-went to see hematologist 2017 (Dr Grayland Ormond)  and no clotting disorder ever found  . Restless leg syndrome   . Sepsis ([place])    FROM KIDNEY STONES  . Sleep apnea    HAD GASTRIC BYPASS AND DOES NOT HAVE SLEEP APNEA  . Urge incontinence     Surgical History: Past Surgical History:  Procedure Laterality Date  . ABDOMINAL HYSTERECTOMY     TOTAL ABDOMINAL HYSTERECTOMY W/ BILATERAL SALPINGOOPHORECTOMY  . BARIATRIC SURGERY  05/15/2016  . CHOLECYSTECTOMY    . ESOPHAGOGASTRODUODENOSCOPY  05/15/2016  . KIDNEY STONE SURGERY    . KNEE  ARTHROSCOPY Right    X 2  . KNEE ARTHROSCOPY Right 11/11/2018   Procedure: Partial right knee arthroscopy,arthroscopic medial and lateral menisectomy, plica excision;  Surgeon: Hessie Knows, MD;  Location: [facility];  Service: Orthopedics;  Laterality: Right;  . KNEE SURGERY Left 2006  . SHOULDER ARTHROSCOPY WITH OPEN ROTATOR CUFF REPAIR Right 11/22/2015   Procedure: SHOULDER ARTHROSCOPY, DEBRIDEMENT, DECOMPRESSION, SLAP REPAIR, POSSIBLE BICEP TENODESIS;  Surgeon: Corky Mull, MD;  Location: [facility];  Service: Orthopedics;  Laterality: Right;  . SINUS EXPLORATION      Home Medications:  Allergies as of 10/25/2019      Reactions   Alcohol Other (See Comments)   wool   Bacitracin-neomycin-polymyxin Other (See Comments)   Codeine Nausea Only, Other (See Comments)   "headache"   Hydrocodone Other (See Comments)   Hydrocortisone Other (See Comments)   Lanolin Other (See Comments)   "rash" Other reaction(s): Unknown   Other Other (See Comments)   Bitartrate/black rubber dye/elastic   Tape Other (See Comments)   Other reaction(s): Unknown-OK TO USE PAPER TAPE   Tapentadol Other (See Comments)   Tramadol Other (See Comments)   "headache"   Bacitracin Rash   Clarithromycin Rash   Latex Rash   Nickel Rash, Other (See Comments)   Oxycodone Rash   Penicillins Rash   Has patient had a PCN reaction causing immediate rash, facial/tongue/throat swelling, SOB  or lightheadedness with hypotension: yes Has patient had a PCN reaction causing severe rash involving mucus membranes or skin necrosis: no Has patient had a PCN reaction that required hospitalization No Has patient had a PCN reaction occurring within the last 10 years: No If all of the above answers are "NO", then may proceed with Cephalosporin use.   Sulfamethoxazole-trimethoprim Rash      Medication List       Accurate as of October 25, 2019  9:49 AM. If you have any questions, ask your nurse or doctor.        acetaminophen 325 MG  tablet Commonly known as: TYLENOL Take 650 mg by mouth every 6 (six) hours as needed for moderate pain.   EpiPen 2-Pak 0.3 mg/0.3 mL Soaj injection Generic drug: EPINEPHrine Inject 0.3 mg into the muscle as needed for anaphylaxis.   fluticasone 50 MCG/ACT nasal spray Commonly known as: FLONASE Place 2 sprays into both nostrils daily as needed for allergies.   levocetirizine 5 MG tablet Commonly known as: XYZAL Take 5 mg by mouth every evening.   montelukast 10 MG tablet Commonly known as: SINGULAIR Take 10 mg by mouth at bedtime.   multivitamin tablet Take 1 tablet by mouth daily.   nitrofurantoin (macrocrystal-monohydrate) 100 MG capsule Commonly known as: Macrobid Take 1 capsule (100 mg total) by mouth 2 (two) times daily.   trospium 20 MG tablet Commonly known as: SANCTURA Take 1 tablet (20 mg total) by mouth 2 (two) times daily. Started by: Debroah Loop, PA-C       Allergies:  Allergies  Allergen Reactions  . Alcohol Other (See Comments)    wool  . Bacitracin-Neomycin-Polymyxin Other (See Comments)  . Codeine Nausea Only and Other (See Comments)    "headache"  . Hydrocodone Other (See Comments)  . Hydrocortisone Other (See Comments)  . Lanolin Other (See Comments)    "rash" Other reaction(s): Unknown  . Other Other (See Comments)    Bitartrate/black rubber dye/elastic  . Tape Other (See Comments)    Other reaction(s): Unknown-OK TO USE PAPER TAPE  . Tapentadol Other (See Comments)  . Tramadol Other (See Comments)    "headache"  . Bacitracin Rash  . Clarithromycin Rash  . Latex Rash  . Nickel Rash and Other (See Comments)  . Oxycodone Rash  . Penicillins Rash    Has patient had a PCN reaction causing immediate rash, facial/tongue/throat swelling, SOB or lightheadedness with hypotension: yes Has patient had a PCN reaction causing severe rash involving mucus membranes or skin necrosis: no Has patient had a PCN reaction that required  hospitalization No Has patient had a PCN reaction occurring within the last 10 years: No If all of the above answers are "NO", then may proceed with Cephalosporin use.   . Sulfamethoxazole-Trimethoprim Rash    Family History: Family History  Problem Relation Age of Onset  . Cancer Mother        rectal  . Lung cancer Father   . Hematuria Brother   . Kidney disease Paternal Grandfather   . Kidney Stones Brother   . Bladder Cancer Neg Hx   . Kidney cancer Neg Hx     Social History:   reports that she has never smoked. She has never used smokeless tobacco. She reports that she does not drink alcohol or use drugs.  Physical Exam: BP 112/72   Pulse 81   Ht 5\' 6"  (1.676 m)   Wt 198 lb (89.8 kg)   BMI 31.96 kg/m  Constitutional:  Alert and oriented, no acute distress, nontoxic appearing HEENT: [place], AT Cardiovascular: No clubbing, cyanosis, or edema Respiratory: Normal respiratory effort, no increased work of breathing Skin: No rashes, bruises or suspicious lesions Neurologic: Grossly intact, no focal deficits, moving all 4 extremities Psychiatric: Normal mood and affect  Laboratory Data: Results for orders placed or performed in visit on 10/25/19  Microscopic Examination   URINE  Result Value Ref Range   WBC, UA >30 (A) 0 - 5 /hpf   RBC 0-2 0 - 2 /hpf   Epithelial Cells (non renal) 0-10 0 - 10 /hpf   Renal Epithel, UA 0-10 (A) None seen /hpf   Mucus, UA Present (A) Not Estab.   Bacteria, UA Many (A) None seen/Few  Urinalysis, Complete  Result Value Ref Range   Specific Gravity, UA 1.025 1.005 - 1.030   pH, UA 6.0 5.0 - 7.5   Color, UA Yellow Yellow   Appearance Ur Cloudy (A) Clear   Leukocytes,UA 1+ (A) Negative   Protein,UA Negative Negative/Trace   Glucose, UA Negative Negative   Ketones, UA Negative Negative   RBC, UA Negative Negative   Bilirubin, UA Negative Negative   Urobilinogen, Ur 1.0 0.2 - 1.0 mg/dL   Nitrite, UA Positive (A) Negative   Microscopic  Examination See below:    Assessment & Plan:   59 year old female with a history of urgency and frequency at baseline with acute exacerbations of urge incontinence associated with positive urine cultures.  I suspect she is experiencing overactive bladder with superimposed recurrent UTI versus asymptomatic bacteriuria. 1. History of recurrent UTI (urinary tract infection) UA grossly infected today, will send for culture for further evaluation.  Will treat her empirically with Macrobid.  Will recommend cranberry supplementation and daily probiotics in the future if these infections persist on OAB therapy as below. - Urinalysis, Complete - CULTURE, URINE COMPREHENSIVE - nitrofurantoin, macrocrystal-monohydrate, (MACROBID) 100 MG capsule; Take 1 capsule (100 mg total) by mouth 2 (two) times daily.  Dispense: 14 capsule; Refill: 0  2. Urinary urgency Starting patient on anticholinergic today for management of OAB symptoms with plans for symptom recheck and PVR in 1 month.  Recommend Myrbetriq as second line if she develops side effects or an allergy to this medication. - trospium (SANCTURA) 20 MG tablet; Take 1 tablet (20 mg total) by mouth 2 (two) times daily.  Dispense: 60 tablet; Refill: 0   Return in about 4 weeks (around 11/22/2019) for Symptom recheck with PVR.  Debroah Loop, PA-C  [facility] [address], [place], [place] [address] [phone]

## 2019-10-28 LAB — CULTURE, URINE COMPREHENSIVE

## 2019-11-02 ENCOUNTER — Ambulatory Visit: Payer: Self-pay | Admitting: Physician Assistant

## 2019-11-11 ENCOUNTER — Ambulatory Visit: Payer: BC Managed Care – PPO | Admitting: Physician Assistant

## 2019-11-11 ENCOUNTER — Other Ambulatory Visit: Payer: Self-pay

## 2019-11-11 VITALS — BP 126/76 | HR 74

## 2019-11-11 DIAGNOSIS — R3915 Urgency of urination: Secondary | ICD-10-CM

## 2019-11-11 DIAGNOSIS — Z87440 Personal history of urinary (tract) infections: Secondary | ICD-10-CM

## 2019-11-11 LAB — BLADDER SCAN AMB NON-IMAGING: Scan Result: 148

## 2019-11-11 MED ORDER — PREMARIN 0.625 MG/GM VA CREA: TOPICAL_CREAM | VAGINAL | 2 refills | Status: DC

## 2019-11-11 MED ORDER — TROSPIUM CHLORIDE 20 MG PO TABS: 20.0000 mg | ORAL_TABLET | Freq: Every day | ORAL | 0 refills | Status: DC

## 2019-11-11 MED ORDER — NITROFURANTOIN MONOHYD MACRO 100 MG PO CAPS: 100.0000 mg | ORAL_CAPSULE | Freq: Two times a day (BID) | ORAL | 0 refills | Status: AC

## 2019-11-11 NOTE — Progress Notes (Signed)
11/11/2019 4:32 PM   Sharon [name] 12-Nov-1960 [id number]  CC: Chief Complaint  Patient presents with  . Urinary Frequency    HPI: Sharon [name] is a 59 y.o. female with PMH recurrent nephrolithiasis with urosepsis, recurrent UTI, urgency, and frequency who presents today for symptom recheck on trospium.  I saw her in clinic most recently on 10/25/2019 for evaluation of possible UTI.  Urine culture ultimately grew ampicillin and tetracycline resistant Klebsiella, I treated her with Macrobid 100 mg twice daily x5 days and started her on trospium 20 mg twice daily for OAB.  Today, patient reports feeling better on Macrobid, however she developed malodorous urine and lower abdominal discomfort 3 days ago.  Patient reports improvement in her urge incontinence on trospium, however overall she is bothered by the sensation that she has not emptying her bladder well.    She is not currently taking anything for prevention of UTI.  No personal history of breast cancer.  She is s/p hysterectomy.  In-office UA today positive for nitrites and 1+ leukocyte esterase; urine microscopy with 11-30 WBCs/HPF and any bacteria. PVR 139mL.  PMH: Past Medical History:  Diagnosis Date  . Acid reflux    H/O PRIOR TO GASTRC BYPASS  . Anemia    H/O  . Anxiety   . Arthritis   . Bladder spasm   . Depression   . Headache    H/O MIGRAINES  . History of kidney stones   . Left ureteral stone   . Leg DVT (deep venous thromboembolism), acute ([facility]CC)    x 2-both times after a surgery-went to see hematologist 2017 (Dr Grayland Ormond)  and no clotting disorder ever found  . Restless leg syndrome   . Sepsis ([place])    FROM KIDNEY STONES  . Sleep apnea    HAD GASTRIC BYPASS AND DOES NOT HAVE SLEEP APNEA  . Urge incontinence     Surgical History: Past Surgical History:  Procedure Laterality Date  . ABDOMINAL HYSTERECTOMY     TOTAL ABDOMINAL HYSTERECTOMY W/ BILATERAL SALPINGOOPHORECTOMY  . BARIATRIC SURGERY   05/15/2016  . CHOLECYSTECTOMY    . ESOPHAGOGASTRODUODENOSCOPY  05/15/2016  . KIDNEY STONE SURGERY    . KNEE ARTHROSCOPY Right    X 2  . KNEE ARTHROSCOPY Right 11/11/2018   Procedure: Partial right knee arthroscopy,arthroscopic medial and lateral menisectomy, plica excision;  Surgeon: Hessie Knows, MD;  Location: [facility];  Service: Orthopedics;  Laterality: Right;  . KNEE SURGERY Left 2006  . SHOULDER ARTHROSCOPY WITH OPEN ROTATOR CUFF REPAIR Right 11/22/2015   Procedure: SHOULDER ARTHROSCOPY, DEBRIDEMENT, DECOMPRESSION, SLAP REPAIR, POSSIBLE BICEP TENODESIS;  Surgeon: Corky Mull, MD;  Location: [facility];  Service: Orthopedics;  Laterality: Right;  . SINUS EXPLORATION      Home Medications:  Allergies as of 11/11/2019      Reactions   Alcohol Other (See Comments)   wool   Bacitracin-neomycin-polymyxin Other (See Comments)   Codeine Nausea Only, Other (See Comments)   "headache"   Hydrocodone Other (See Comments)   Hydrocortisone Other (See Comments)   Lanolin Other (See Comments)   "rash" Other reaction(s): Unknown   Other Other (See Comments)   Bitartrate/black rubber dye/elastic   Tape Other (See Comments)   Other reaction(s): Unknown-OK TO USE PAPER TAPE   Tapentadol Other (See Comments)   Tramadol Other (See Comments)   "headache"   Bacitracin Rash   Clarithromycin Rash   Latex Rash   Nickel Rash, Other (See Comments)   Oxycodone  Rash   Penicillins Rash   Has patient had a PCN reaction causing immediate rash, facial/tongue/throat swelling, SOB or lightheadedness with hypotension: yes Has patient had a PCN reaction causing severe rash involving mucus membranes or skin necrosis: no Has patient had a PCN reaction that required hospitalization No Has patient had a PCN reaction occurring within the last 10 years: No If all of the above answers are "NO", then may proceed with Cephalosporin use.   Sulfamethoxazole-trimethoprim Rash      Medication List       Accurate  as of November 11, 2019  4:32 PM. If you have any questions, ask your nurse or doctor.        acetaminophen 325 MG tablet Commonly known as: TYLENOL Take 650 mg by mouth every 6 (six) hours as needed for moderate pain.   EpiPen 2-Pak 0.3 mg/0.3 mL Soaj injection Generic drug: EPINEPHrine Inject 0.3 mg into the muscle as needed for anaphylaxis.   fluticasone 50 MCG/ACT nasal spray Commonly known as: FLONASE Place 2 sprays into both nostrils daily as needed for allergies.   levocetirizine 5 MG tablet Commonly known as: XYZAL Take 5 mg by mouth every evening.   montelukast 10 MG tablet Commonly known as: SINGULAIR Take 10 mg by mouth at bedtime.   multivitamin tablet Take 1 tablet by mouth daily.   nitrofurantoin (macrocrystal-monohydrate) 100 MG capsule Commonly known as: MACROBID Take 1 capsule (100 mg total) by mouth every 12 (twelve) hours for 5 days. What changed: when to take this Changed by: Debroah Loop, PA-C   Premarin vaginal cream Generic drug: conjugated estrogens Apply one pea-sized amount around the opening to your urethra three times weekly. Started by: Debroah Loop, PA-C   trospium 20 MG tablet Commonly known as: SANCTURA Take 1 tablet (20 mg total) by mouth daily. What changed: when to take this Changed by: Debroah Loop, PA-C       Allergies:  Allergies  Allergen Reactions  . Alcohol Other (See Comments)    wool  . Bacitracin-Neomycin-Polymyxin Other (See Comments)  . Codeine Nausea Only and Other (See Comments)    "headache"  . Hydrocodone Other (See Comments)  . Hydrocortisone Other (See Comments)  . Lanolin Other (See Comments)    "rash" Other reaction(s): Unknown  . Other Other (See Comments)    Bitartrate/black rubber dye/elastic  . Tape Other (See Comments)    Other reaction(s): Unknown-OK TO USE PAPER TAPE  . Tapentadol Other (See Comments)  . Tramadol Other (See Comments)    "headache"  . Bacitracin Rash    . Clarithromycin Rash  . Latex Rash  . Nickel Rash and Other (See Comments)  . Oxycodone Rash  . Penicillins Rash    Has patient had a PCN reaction causing immediate rash, facial/tongue/throat swelling, SOB or lightheadedness with hypotension: yes Has patient had a PCN reaction causing severe rash involving mucus membranes or skin necrosis: no Has patient had a PCN reaction that required hospitalization No Has patient had a PCN reaction occurring within the last 10 years: No If all of the above answers are "NO", then may proceed with Cephalosporin use.   . Sulfamethoxazole-Trimethoprim Rash    Family History: Family History  Problem Relation Age of Onset  . Cancer Mother        rectal  . Lung cancer Father   . Hematuria Brother   . Kidney disease Paternal Grandfather   . Kidney Stones Brother   . Bladder Cancer Neg Hx   .  Kidney cancer Neg Hx     Social History:   reports that she has never smoked. She has never used smokeless tobacco. She reports that she does not drink alcohol and does not use drugs.  Physical Exam: BP 126/76   Pulse 74   Constitutional:  Alert and oriented, no acute distress, nontoxic appearing HEENT: [place], AT Cardiovascular: No clubbing, cyanosis, or edema Respiratory: Normal respiratory effort, no increased work of breathing Skin: No rashes, bruises or suspicious lesions Neurologic: Grossly intact, no focal deficits, moving all 4 extremities Psychiatric: Normal mood and affect  Laboratory Data: Results for orders placed or performed in visit on 11/11/19  CULTURE, URINE COMPREHENSIVE   Specimen: Urine   UR  Result Value Ref Range   Urine Culture, Comprehensive Final report (A)    Organism ID, Bacteria Klebsiella pneumoniae (A)    ANTIMICROBIAL SUSCEPTIBILITY Comment   Microscopic Examination   Urine  Result Value Ref Range   WBC, UA 11-30 (A) 0 - 5 /hpf   RBC 0-2 0 - 2 /hpf   Epithelial Cells (non renal) 0-10 0 - 10 /hpf   Renal Epithel, UA  0-10 (A) None seen /hpf   Bacteria, UA Many (A) None seen/Few  Urinalysis, Complete  Result Value Ref Range   Specific Gravity, UA 1.020 1.005 - 1.030   pH, UA 7.0 5.0 - 7.5   Color, UA Yellow Yellow   Appearance Ur Cloudy (A) Clear   Leukocytes,UA 1+ (A) Negative   Protein,UA Negative Negative/Trace   Glucose, UA Negative Negative   Ketones, UA Negative Negative   RBC, UA Negative Negative   Bilirubin, UA Negative Negative   Urobilinogen, Ur 2.0 (H) 0.2 - 1.0 mg/dL   Nitrite, UA Positive (A) Negative   Microscopic Examination See below:   BLADDER SCAN AMB NON-IMAGING  Result Value Ref Range   Scan Result 148 ML    Assessment & Plan:   1. History of recurrent UTI (urinary tract infection) UA grossly infected today, will start her on empiric Macrobid and send urine for culture for further evaluation  Starting patient on recurrent UTI prophylaxis today including daily probiotics, twice daily cranberry supplements, and 3 times weekly topical vaginal estrogen cream.  Patient expressed understanding.  Premarin sample provided in clinic today.  Given frequency of recent infections as well as status post hysterectomy, recommend cystoscopy at this time.  We will schedule this in 1 month. - Urinalysis, Complete - BLADDER SCAN AMB NON-IMAGING - CULTURE, URINE COMPREHENSIVE - nitrofurantoin, macrocrystal-monohydrate, (MACROBID) 100 MG capsule; Take 1 capsule (100 mg total) by mouth every 12 (twelve) hours for 5 days.  Dispense: 10 capsule; Refill: 0 - conjugated estrogens (PREMARIN) vaginal cream; Apply one pea-sized amount around the opening to your urethra three times weekly.  Dispense: 30 g; Refill: 2  2. Urinary urgency Incomplete bladder emptying today, likely contributing to #1 above.  Recommend decreasing trospium to once daily with plans for medication secession if she continues to have elevated residual. - trospium (SANCTURA) 20 MG tablet; Take 1 tablet (20 mg total) by mouth daily.   Dispense: 30 tablet; Refill: 0  Return in about 4 weeks (around 12/09/2019) for Symptom recheck, PVR, and cystoscopy with Dr. Erlene Quan.  Debroah Loop, PA-C  [facility] [address], [place], [place] [address] [phone]

## 2019-11-11 NOTE — Patient Instructions (Addendum)
1. Reduce your trospium to once daily. 2. For UTI prevention, start the following: 1. Daily probiotics containing lactobacillus. 2. Twice daily cranberry supplements marketed for urinary tract health. Try to take these on an empty stomach when you can. 3. Three times weekly topical vaginal estrogen cream. Apply one pea-sized amount around urethra (where your urine comes out). 3. I am starting you on antibiotics today and sending your urine for culture; I will call you if I need to switch your antibiotics based on your culture results. 4. We will see you back in clinic in about one month for a cystoscopy with Dr. Erlene Quan.  Cystoscopy Cystoscopy is a procedure that is used to help diagnose and sometimes treat conditions that affect the lower urinary tract. The lower urinary tract includes the bladder and the urethra. The urethra is the tube that drains urine from the bladder. Cystoscopy is done using a thin, tube-shaped instrument with a light and camera at the end (cystoscope). The cystoscope may be hard or flexible, depending on the goal of the procedure. The cystoscope is inserted through the urethra, into the bladder. Cystoscopy may be recommended if you have:  Urinary tract infections that keep coming back.  Blood in the urine (hematuria).  An inability to control when you urinate (urinary incontinence) or an overactive bladder.  Unusual cells found in a urine sample.  A blockage in the urethra, such as a urinary stone.  Painful urination.  An abnormality in the bladder found during an intravenous pyelogram (IVP) or CT scan. Cystoscopy may also be done to remove a sample of tissue to be examined under a microscope (biopsy). What are the risks? Generally, this is a safe procedure. However, problems may occur, including:  Infection.  Bleeding.  What happens during the procedure?  1. You will be given one or more of the following: ? A medicine to numb the area (local  anesthetic). 2. The area around the opening of your urethra will be cleaned. 3. The cystoscope will be passed through your urethra into your bladder. 4. Germ-free (sterile) fluid will flow through the cystoscope to fill your bladder. The fluid will stretch your bladder so that your health care provider can clearly examine your bladder walls. 5. Your doctor will look at the urethra and bladder. 6. The cystoscope will be removed The procedure may vary among health care providers  What can I expect after the procedure? After the procedure, it is common to have: 1. Some soreness or pain in your abdomen and urethra. 2. Urinary symptoms. These include: ? Mild pain or burning when you urinate. Pain should stop within a few minutes after you urinate. This may last for up to 1 week. ? A small amount of blood in your urine for several days. ? Feeling like you need to urinate but producing only a small amount of urine. Follow these instructions at home: General instructions  Return to your normal activities as told by your health care provider.   Do not drive for 24 hours if you were given a sedative during your procedure.  Watch for any blood in your urine. If the amount of blood in your urine increases, call your health care provider.  If a tissue sample was removed for testing (biopsy) during your procedure, it is up to you to get your test results. Ask your health care provider, or the department that is doing the test, when your results will be ready.  Drink enough fluid to keep your urine  pale yellow.  Keep all follow-up visits as told by your health care provider. This is important. Contact a health care provider if you:  Have pain that gets worse or does not get better with medicine, especially pain when you urinate.  Have trouble urinating.  Have more blood in your urine. Get help right away if you:  Have blood clots in your urine.  Have abdominal pain.  Have a fever or  chills.  Are unable to urinate. Summary  Cystoscopy is a procedure that is used to help diagnose and sometimes treat conditions that affect the lower urinary tract.  Cystoscopy is done using a thin, tube-shaped instrument with a light and camera at the end.  After the procedure, it is common to have some soreness or pain in your abdomen and urethra.  Watch for any blood in your urine. If the amount of blood in your urine increases, call your health care provider.  If you were prescribed an antibiotic medicine, take it as told by your health care provider. Do not stop taking the antibiotic even if you start to feel better. This information is not intended to replace advice given to you by your health care provider. Make sure you discuss any questions you have with your health care provider. Document Revised: 04/27/2018 Document Reviewed: 04/27/2018 Elsevier Patient Education  [employer].

## 2019-11-14 ENCOUNTER — Telehealth: Payer: Self-pay | Admitting: Physician Assistant

## 2019-11-14 LAB — URINALYSIS, COMPLETE
Bilirubin, UA: NEGATIVE
Glucose, UA: NEGATIVE
Ketones, UA: NEGATIVE
Nitrite, UA: POSITIVE — AB
Protein,UA: NEGATIVE
RBC, UA: NEGATIVE
Specific Gravity, UA: 1.02 (ref 1.005–1.030)
Urobilinogen, Ur: 2 mg/dL — ABNORMAL HIGH (ref 0.2–1.0)
pH, UA: 7 (ref 5.0–7.5)

## 2019-11-14 LAB — CULTURE, URINE COMPREHENSIVE

## 2019-11-14 LAB — MICROSCOPIC EXAMINATION

## 2019-11-14 MED ORDER — CIPROFLOXACIN HCL 250 MG PO TABS: 250.0000 mg | ORAL_TABLET | Freq: Two times a day (BID) | ORAL | 0 refills | Status: AC

## 2019-11-14 NOTE — Telephone Encounter (Signed)
Please contact the patient and inform her that I would like to change her antibiotics based on her urine culture results.  I have sent Cipro 250 mg twice daily x5 days to the [facility] in [place].  She can stop Macrobid.

## 2019-11-15 NOTE — Telephone Encounter (Signed)
Notified patient as advised. Patient verbalized understanding and agreed to pickup and start new ABX.

## 2019-11-21 ENCOUNTER — Other Ambulatory Visit: Payer: Self-pay | Admitting: Physician Assistant

## 2019-11-21 DIAGNOSIS — R3915 Urgency of urination: Secondary | ICD-10-CM

## 2019-11-24 ENCOUNTER — Ambulatory Visit: Payer: Self-pay | Admitting: Physician Assistant

## 2019-12-06 NOTE — Progress Notes (Signed)
   12/07/2019  CC:  Chief Complaint  Patient presents with  . Cysto    HPI: Sharon [name] is a 59 y.o.  female with PMH recurrent nephrolithiasis with urosepsis, recurrent UTI, urgency, and frequency who presents today for a cysto.   No urinary complaints today.   Blood pressure 102/72, pulse 79, weight 198 lb (89.8 kg).  NED. A&Ox3.   No respiratory distress   Abd soft, NT, ND Normal external genitalia with patent urethral meatus  Cystoscopy Procedure Note  Patient identification was confirmed, informed consent was obtained, and patient was prepped using Betadine solution.  Lidocaine jelly was administered per urethral meatus.    Procedure: - Flexible cystoscope introduced, without any difficulty.   - Thorough search of the bladder revealed:    normal urethral meatus    normal urothelium    no stones    no ulcers     no tumors    no urethral polyps    no trabeculation  - Ureteral orifices were normal in position and appearance.  Post-Procedure: - Patient tolerated the procedure well  Urinalysis  Negative  Assessment/ Plan:  1. History of recurrent UTI  No urinary complaints today. UA is negative. Cysto is unremarkable and shows no evidence of underlying bladder pathology.  Continue suppressive antibiotics per Debroah Loop, PA-C.   Return in 6 months for a symptoms recheck   I, Selena Batten, am acting as a scribe for Dr. Hollice Espy.  I have reviewed the above documentation for accuracy and completeness, and I agree with the above.   Hollice Espy, MD

## 2019-12-07 ENCOUNTER — Other Ambulatory Visit: Payer: Self-pay

## 2019-12-07 ENCOUNTER — Ambulatory Visit: Payer: BC Managed Care – PPO | Admitting: Urology

## 2019-12-07 VITALS — BP 102/72 | HR 79 | Wt 198.0 lb

## 2019-12-07 DIAGNOSIS — N3001 Acute cystitis with hematuria: Secondary | ICD-10-CM | POA: Diagnosis not present

## 2019-12-08 LAB — URINALYSIS, COMPLETE
Bilirubin, UA: NEGATIVE
Glucose, UA: NEGATIVE
Ketones, UA: NEGATIVE
Leukocytes,UA: NEGATIVE
Nitrite, UA: NEGATIVE
Protein,UA: NEGATIVE
RBC, UA: NEGATIVE
Specific Gravity, UA: 1.025 (ref 1.005–1.030)
Urobilinogen, Ur: 2 mg/dL — ABNORMAL HIGH (ref 0.2–1.0)
pH, UA: 5.5 (ref 5.0–7.5)

## 2019-12-08 LAB — MICROSCOPIC EXAMINATION: Bacteria, UA: NONE SEEN

## 2019-12-28 ENCOUNTER — Ambulatory Visit: Payer: BC Managed Care – PPO | Admitting: Dermatology

## 2019-12-29 ENCOUNTER — Other Ambulatory Visit: Payer: Self-pay

## 2019-12-29 ENCOUNTER — Encounter: Payer: Self-pay | Admitting: Emergency Medicine

## 2019-12-29 ENCOUNTER — Ambulatory Visit
Admission: EM | Admit: 2019-12-29 | Discharge: 2019-12-29 | Disposition: A | Discharge status: Home / Self Care | Payer: BC Managed Care – PPO | Attending: Family Medicine | Admitting: Family Medicine

## 2019-12-29 DIAGNOSIS — Z79899 Other long term (current) drug therapy: Secondary | ICD-10-CM | POA: Insufficient documentation

## 2019-12-29 DIAGNOSIS — Z9884 Bariatric surgery status: Secondary | ICD-10-CM | POA: Insufficient documentation

## 2019-12-29 DIAGNOSIS — F419 Anxiety disorder, unspecified: Secondary | ICD-10-CM | POA: Diagnosis not present

## 2019-12-29 DIAGNOSIS — Z7901 Long term (current) use of anticoagulants: Secondary | ICD-10-CM | POA: Diagnosis not present

## 2019-12-29 DIAGNOSIS — J028 Acute pharyngitis due to other specified organisms: Secondary | ICD-10-CM | POA: Diagnosis not present

## 2019-12-29 DIAGNOSIS — M1990 Unspecified osteoarthritis, unspecified site: Secondary | ICD-10-CM | POA: Insufficient documentation

## 2019-12-29 DIAGNOSIS — Z86718 Personal history of other venous thrombosis and embolism: Secondary | ICD-10-CM | POA: Diagnosis not present

## 2019-12-29 DIAGNOSIS — B9789 Other viral agents as the cause of diseases classified elsewhere: Secondary | ICD-10-CM | POA: Diagnosis not present

## 2019-12-29 DIAGNOSIS — G2581 Restless legs syndrome: Secondary | ICD-10-CM | POA: Insufficient documentation

## 2019-12-29 DIAGNOSIS — G4733 Obstructive sleep apnea (adult) (pediatric): Secondary | ICD-10-CM | POA: Diagnosis not present

## 2019-12-29 DIAGNOSIS — G43909 Migraine, unspecified, not intractable, without status migrainosus: Secondary | ICD-10-CM | POA: Diagnosis not present

## 2019-12-29 DIAGNOSIS — Z20822 Contact with and (suspected) exposure to covid-19: Secondary | ICD-10-CM | POA: Diagnosis not present

## 2019-12-29 DIAGNOSIS — J029 Acute pharyngitis, unspecified: Secondary | ICD-10-CM | POA: Diagnosis not present

## 2019-12-29 DIAGNOSIS — F329 Major depressive disorder, single episode, unspecified: Secondary | ICD-10-CM | POA: Diagnosis not present

## 2019-12-29 DIAGNOSIS — K219 Gastro-esophageal reflux disease without esophagitis: Secondary | ICD-10-CM | POA: Insufficient documentation

## 2019-12-29 LAB — SARS CORONAVIRUS 2 (TAT 6-24 HRS): SARS Coronavirus 2: NEGATIVE

## 2019-12-29 LAB — GROUP A STREP BY PCR: Group A Strep by PCR: NOT DETECTED

## 2019-12-29 NOTE — Discharge Instructions (Signed)
We will call if the strep test is positive.  Tylenol 1000 mg three times daily as needed for sore throat. Try hot tea and honey.  Covid test will be available in 24 hours.  Take care  Dr. Lacinda Axon

## 2019-12-29 NOTE — ED Provider Notes (Signed)
[facility]    CSN: [id number] Arrival date & time: 12/29/19  9798      History   Chief Complaint Chief Complaint  Patient presents with  . Sore Throat  . Fatigue   HPI   59 year old Sharon [name] [name] presents with sore throat and fatigue. Patient reports that her symptoms started yesterday. Started initially with fatigue and now has had sore throat. She reports that its very sore and she has some pain with swallowing. No sick contacts. No documented fever. No relieving factors. No other associated symptoms. No other complaints.  Past Medical History:  Diagnosis Date  . Acid reflux    H/O PRIOR TO GASTRC BYPASS  . Anemia    H/O  . Anxiety   . Arthritis   . Bladder spasm   . Depression   . Headache    H/O MIGRAINES  . History of kidney stones   . Left ureteral stone   . Leg DVT (deep venous thromboembolism), acute (HCC)    x 2-both times after a surgery-went to see hematologist 2017 (Dr Grayland Ormond)  and no clotting disorder ever found  . Restless leg syndrome   . Sepsis ([place])    FROM KIDNEY STONES  . Sleep apnea    HAD GASTRIC BYPASS AND DOES NOT HAVE SLEEP APNEA  . Urge incontinence     Patient Active Problem List   Diagnosis Date Noted  . Anxiety Sharon [name]/Sharon [name]/2020  . Depression Sharon [name]/Sharon [name]/2020  . Migraine Sharon [name]/Sharon [name]/2020  . Marginal ulcer Sharon [name]/10/2016  . S/P gastric bypass Sharon [name]/10/2016  . Sepsis ([place]) 11/15/2016  . Hydronephrosis 11/15/2016  . GERD (gastroesophageal reflux disease) 11/15/2016  . UTI (urinary tract infection) 11/15/2016  . Right leg DVT ([place]) 01/24/2016  . Acute deep vein thrombosis (DVT) of popliteal vein of right lower extremity ([place]) 01/04/2016  . Incomplete tear of right rotator cuff 11/22/2015  . Moderate obstructive sleep apnea 09/21/2015  . Kidney stones 09/10/2015  . Urge incontinence 09/10/2015  . Nocturia 09/10/2015  . Joint pain 09/05/2015  . S/P laparoscopic cholecystectomy 09/05/2015  . Paralabral cyst of shoulder, right, subsequent encounter  01/10/2015  . Rotator cuff tendinitis, right 01/10/2015  . Tear of right glenoid labrum 01/10/2015  . Acute ankle pain 11/16/2013  . Acute shoulder pain 11/16/2013    Past Surgical History:  Procedure Laterality Date  . ABDOMINAL HYSTERECTOMY     TOTAL ABDOMINAL HYSTERECTOMY W/ BILATERAL SALPINGOOPHORECTOMY  . BARIATRIC SURGERY  05/15/2016  . CHOLECYSTECTOMY    . ESOPHAGOGASTRODUODENOSCOPY  05/15/2016  . KIDNEY STONE SURGERY    . KNEE ARTHROSCOPY Right    X 2  . KNEE ARTHROSCOPY Right 11/11/2018   Procedure: Partial right knee arthroscopy,arthroscopic medial and lateral menisectomy, plica excision;  Surgeon: Hessie Knows, MD;  Location: [facility];  Service: Orthopedics;  Laterality: Right;  . KNEE SURGERY Left 2006  . SHOULDER ARTHROSCOPY WITH OPEN ROTATOR CUFF REPAIR Right 11/22/2015   Procedure: SHOULDER ARTHROSCOPY, DEBRIDEMENT, DECOMPRESSION, SLAP REPAIR, POSSIBLE BICEP TENODESIS;  Surgeon: Corky Mull, MD;  Location: [facility];  Service: Orthopedics;  Laterality: Right;  . SINUS EXPLORATION      OB History   No obstetric history on file.      Home Medications    Prior to Admission medications   Medication Sig Start Date End Date Taking? Authorizing Provider  acetaminophen (TYLENOL) 325 MG tablet Take 650 mg by mouth every 6 (six) hours as needed for moderate pain.   Yes [provider]  Azelaic Acid 15 % cream  Apply 1 application topically 2 (two) times daily. 12/05/19  Yes [provider]  doxycycline (VIBRA-TABS) 100 MG tablet Take 100 mg by mouth daily. 12/05/19  Yes [provider]  fluticasone (FLONASE) 50 MCG/ACT nasal spray Place 2 sprays into both nostrils daily as needed for allergies.  07/05/16  Yes [provider]  levocetirizine (XYZAL) 5 MG tablet Take 5 mg by mouth every evening.   Yes [provider]  montelukast (SINGULAIR) 10 MG tablet Take 10 mg by mouth at bedtime.   Yes [provider]  Multiple Vitamin  (MULTIVITAMIN) tablet Take 1 tablet by mouth daily.   Yes [provider]  conjugated estrogens (PREMARIN) vaginal cream Apply one pea-sized amount around the opening to your urethra three times weekly. 11/11/19   Vaillancourt, Aldona Bar, PA-C  trospium (SANCTURA) 20 MG tablet Take 1 tablet (20 mg total) by mouth daily. 11/11/19   Vaillancourt, Aldona Bar, PA-C  apixaban (ELIQUIS) 5 MG TABS tablet Take 1 tablet (5 mg total) by mouth 2 (two) times daily. 11/11/18 Sharon [name]/27/20  Hessie Knows, MD    Family History Family History  Problem Relation Age of Onset  . Cancer Mother        rectal  . Lung cancer Father   . Hematuria Brother   . Kidney disease Paternal Grandfather   . Kidney Stones Brother   . Bladder Cancer Neg Hx   . Kidney cancer Neg Hx     Social History Social History   Tobacco Use  . Smoking status: Never Smoker  . Smokeless tobacco: Never Used  Vaping Use  . Vaping Use: Never used  Substance Use Topics  . Alcohol use: No    Alcohol/week: 0.0 standard drinks  . Drug use: No     Allergies   Alcohol, Bacitracin-neomycin-polymyxin, Codeine, Hydrocodone, Hydrocortisone, Lanolin, Other, Tape, Tapentadol, Tramadol, Bacitracin, Clarithromycin, Latex, Nickel, Oxycodone, Penicillins, and Sulfamethoxazole-trimethoprim   Review of Systems Review of Systems  Constitutional: Positive for fatigue.  HENT: Positive for sore throat.    Physical Exam Triage Vital Signs ED Triage Vitals  Enc Vitals Group     BP 12/29/19 0931 127/67     Pulse Rate 12/29/19 0931 67     Resp 12/29/19 0931 18     Temp 12/29/19 0931 97.8 F (36.6 C)     Temp Source 12/29/19 0931 Oral     SpO2 12/29/19 0931 99 %     Weight 12/29/19 0931 201 lb (91.2 kg)     Height 12/29/19 0931 5' 6.5" (1.689 m)     Head Circumference --      Peak Flow --      Pain Score 12/29/19 0930 4     Pain Loc --      Pain Edu? --      Excl. in [place]? --    Updated Vital Signs BP 127/67 (BP Location: Left Arm)    Pulse 67   Temp 97.8 F (36.6 C) (Oral)   Resp 18   Ht 5' 6.5" (1.689 m)   Wt 91.2 kg   SpO2 99%   BMI 31.96 kg/m   Visual Acuity Right Eye Distance:   Left Eye Distance:   Bilateral Distance:    Right Eye Near:   Left Eye Near:    Bilateral Near:     Physical Exam Vitals and nursing note reviewed.  Constitutional:      General: She is not in acute distress.    Appearance: Normal appearance. She is not  ill-appearing.  HENT:     Head: Normocephalic and atraumatic.     Right Ear: Tympanic membrane normal.     Left Ear: Tympanic membrane normal.     Mouth/Throat:     Pharynx: Oropharynx is clear. No oropharyngeal exudate.  Eyes:     General:        Right eye: No discharge.        Left eye: No discharge.     Conjunctiva/sclera: Conjunctivae normal.  Cardiovascular:     Rate and Rhythm: Normal rate and regular rhythm.     Heart sounds: No murmur heard.   Pulmonary:     Effort: Pulmonary effort is normal.     Breath sounds: Normal breath sounds. No wheezing, rhonchi or rales.  Neurological:     Mental Status: She is alert.  Psychiatric:        Mood and Affect: Mood normal.        Behavior: Behavior normal.    UC Treatments / Results  Labs (all labs ordered are listed, but only abnormal results are displayed) Labs Reviewed  GROUP A STREP BY PCR  SARS CORONAVIRUS 2 (TAT 6-24 HRS)    EKG   Radiology No results found.  Procedures Procedures (including critical care time)  Medications Ordered in UC Medications - No data to display  Initial Impression / Assessment and Plan / UC Course  I have reviewed the triage vital signs and the nursing notes.  Pertinent labs & imaging results that were available during my care of the patient were reviewed by me and considered in my medical decision making (see chart for details).    59 year old Sharon [name] [name] presents with viral pharyngitis. Strep negative today. Advised Tylenol and supportive care for sore throat. Cannot  use NSAIDs in the setting of bariatric surgery. Covid test will be available in 24 hours. Work note given.  Final Clinical Impressions(s) / UC Diagnoses   Final diagnoses:  Viral pharyngitis     Discharge Instructions     We will call if the strep test is positive.  Tylenol 1000 mg three times daily as needed for sore throat. Try hot tea and honey.  Covid test will be available in 24 hours.  Take care  Dr. Lacinda Axon    ED Prescriptions    None     PDMP not reviewed this encounter.   Coral Spikes, DO 12/29/19 1006

## 2019-12-29 NOTE — ED Triage Notes (Signed)
Patient in today c/o fatigue x 2-3 days and sore throat x 1 day. Patient completed the Moderna covid vaccine 08/2019. Patient denies fever.

## 2020-03-20 ENCOUNTER — Encounter: Payer: Self-pay | Admitting: Emergency Medicine

## 2020-03-20 ENCOUNTER — Ambulatory Visit
Admission: EM | Admit: 2020-03-20 | Discharge: 2020-03-20 | Disposition: A | Discharge status: Home / Self Care | Payer: BC Managed Care – PPO | Attending: Emergency Medicine | Admitting: Emergency Medicine

## 2020-03-20 ENCOUNTER — Other Ambulatory Visit: Payer: Self-pay

## 2020-03-20 DIAGNOSIS — H109 Unspecified conjunctivitis: Secondary | ICD-10-CM | POA: Diagnosis not present

## 2020-03-20 DIAGNOSIS — J309 Allergic rhinitis, unspecified: Secondary | ICD-10-CM | POA: Diagnosis not present

## 2020-03-20 MED ORDER — MOXIFLOXACIN HCL 0.5 % OP SOLN
1.0000 [drp] | Freq: Three times a day (TID) | OPHTHALMIC | 0 refills | Status: DC
Start: 2020-03-20 — End: 2020-04-17

## 2020-03-20 NOTE — ED Triage Notes (Signed)
Patient c/o sore throat and right eye redness and irritation that started Sunday.

## 2020-03-20 NOTE — Discharge Instructions (Addendum)
Continue your oral allergy medicines.  Utilize your Flonase nightly, 2 squirts in each nostril, follow each set of actuations with 1 squirt of saline nasal spray as we discussed.  Perform sinus irrigation twice daily with warm distilled water and a NeilMed sinus rinse kit.  I would do this 20 minutes before you utilize your nasal spray at night.  Use the Vigamox drops, 1 drop in your right eye 3 times a day for 7 days.  If your symptoms return you need to follow-up with your ophthalmologist for culture.

## 2020-03-20 NOTE — ED Provider Notes (Signed)
[facility]    CSN: [id number] Arrival date & time: 03/20/20  1029      History   Chief Complaint Chief Complaint  Patient presents with  . Sore Throat  . Eye Problem    HPI Sharon [name] is a 59 y.o. female.   59 year old female here for evaluation of sore throat and right eye redness.  Patient reports that her symptoms started 2 days ago.  Her right eye is red, itchy, and has a crusty discharge on her lashes in the morning.  She denies eye pain or changes in vision.  Sore throat also started 2 days ago, is scratchy in nature with postnasal drip.  Patient says she has some sinus pain.  She denies nasal discharge, cough, shortness of breath, changes to taste or smell, body aches, or sick contacts.  Patient has been treated twice for conjunctivitis in her right eye, and once in both eyes with ofloxacin eyedrops since September.     Past Medical History:  Diagnosis Date  . Acid reflux    H/O PRIOR TO GASTRC BYPASS  . Anemia    H/O  . Anxiety   . Arthritis   . Bladder spasm   . Depression   . Headache    H/O MIGRAINES  . History of kidney stones   . Left ureteral stone   . Leg DVT (deep venous thromboembolism), acute (HCC)    x 2-both times after a surgery-went to see hematologist 2017 (Dr Grayland Ormond)  and no clotting disorder ever found  . Restless leg syndrome   . Sepsis ([place])    FROM KIDNEY STONES  . Sleep apnea    HAD GASTRIC BYPASS AND DOES NOT HAVE SLEEP APNEA  . Urge incontinence     Patient Active Problem List   Diagnosis Date Noted  . Anxiety 03/30/2019  . Depression 03/30/2019  . Migraine 03/30/2019  . Marginal ulcer 03/24/2017  . S/P gastric bypass 03/24/2017  . Sepsis ([place]) 11/15/2016  . Hydronephrosis 11/15/2016  . GERD (gastroesophageal reflux disease) 11/15/2016  . UTI (urinary tract infection) 11/15/2016  . Right leg DVT ([place]) 01/24/2016  . Acute deep vein thrombosis (DVT) of popliteal vein of right lower extremity ([place])  01/04/2016  . Incomplete tear of right rotator cuff 11/22/2015  . Moderate obstructive sleep apnea 09/21/2015  . Kidney stones 09/10/2015  . Urge incontinence 09/10/2015  . Nocturia 09/10/2015  . Joint pain 09/05/2015  . S/P laparoscopic cholecystectomy 09/05/2015  . Paralabral cyst of shoulder, right, subsequent encounter 01/10/2015  . Rotator cuff tendinitis, right 01/10/2015  . Tear of right glenoid labrum 01/10/2015  . Acute ankle pain 11/16/2013  . Acute shoulder pain 11/16/2013    Past Surgical History:  Procedure Laterality Date  . ABDOMINAL HYSTERECTOMY     TOTAL ABDOMINAL HYSTERECTOMY W/ BILATERAL SALPINGOOPHORECTOMY  . BARIATRIC SURGERY  05/15/2016  . CHOLECYSTECTOMY    . ESOPHAGOGASTRODUODENOSCOPY  05/15/2016  . KIDNEY STONE SURGERY    . KNEE ARTHROSCOPY Right    X 2  . KNEE ARTHROSCOPY Right 11/11/2018   Procedure: Partial right knee arthroscopy,arthroscopic medial and lateral menisectomy, plica excision;  Surgeon: Hessie Knows, MD;  Location: [facility];  Service: Orthopedics;  Laterality: Right;  . KNEE SURGERY Left 2006  . SHOULDER ARTHROSCOPY WITH OPEN ROTATOR CUFF REPAIR Right 11/22/2015   Procedure: SHOULDER ARTHROSCOPY, DEBRIDEMENT, DECOMPRESSION, SLAP REPAIR, POSSIBLE BICEP TENODESIS;  Surgeon: Corky Mull, MD;  Location: [facility];  Service: Orthopedics;  Laterality: Right;  . SINUS  EXPLORATION      OB History   No obstetric history on file.      Home Medications    Prior to Admission medications   Medication Sig Start Date End Date Taking? Authorizing Provider  fluticasone (FLONASE) 50 MCG/ACT nasal spray Place 2 sprays into both nostrils daily as needed for allergies.  07/05/16  Yes [provider]  levocetirizine (XYZAL) 5 MG tablet Take 5 mg by mouth every evening.   Yes [provider]  montelukast (SINGULAIR) 10 MG tablet Take 10 mg by mouth at bedtime.   Yes [provider]  Multiple Vitamin (MULTIVITAMIN) tablet Take 1  tablet by mouth daily.   Yes [provider]  conjugated estrogens (PREMARIN) vaginal cream Apply one pea-sized amount around the opening to your urethra three times weekly. 11/11/19   Vaillancourt, Aldona Bar, PA-C  moxifloxacin (VIGAMOX) 0.5 % ophthalmic solution Place 1 drop into the right eye 3 (three) times daily. 03/20/20   Margarette Canada, NP  apixaban (ELIQUIS) 5 MG TABS tablet Take 1 tablet (5 mg total) by mouth 2 (two) times daily. 11/11/18 04/15/19  Hessie Knows, MD    Family History Family History  Problem Relation Age of Onset  . Cancer Mother        rectal  . Lung cancer Father   . Hematuria Brother   . Kidney disease Paternal Grandfather   . Kidney Stones Brother   . Bladder Cancer Neg Hx   . Kidney cancer Neg Hx     Social History Social History   Tobacco Use  . Smoking status: Never Smoker  . Smokeless tobacco: Never Used  Vaping Use  . Vaping Use: Never used  Substance Use Topics  . Alcohol use: No    Alcohol/week: 0.0 standard drinks  . Drug use: No     Allergies   Alcohol, Bacitracin-neomycin-polymyxin, Codeine, Hydrocodone, Hydrocortisone, Lanolin, Other, Tape, Tapentadol, Tramadol, Bacitracin, Clarithromycin, Latex, Nickel, Oxycodone, Penicillins, and Sulfamethoxazole-trimethoprim   Review of Systems Review of Systems  Constitutional: Negative for activity change, appetite change and fever.  HENT: Positive for congestion, postnasal drip, rhinorrhea, sinus pain and sore throat.   Respiratory: Negative for cough, shortness of breath and wheezing.   Cardiovascular: Negative for chest pain.  Gastrointestinal: Negative for nausea and vomiting.  Musculoskeletal: Negative for arthralgias and myalgias.  Skin: Positive for rash.  Neurological: Negative for headaches.  Hematological: Negative.   Psychiatric/Behavioral: Negative.      Physical Exam Triage Vital Signs ED Triage Vitals  Enc Vitals Group     BP 03/20/20 1109 126/80     Pulse Rate  03/20/20 1109 60     Resp 03/20/20 1109 18     Temp 03/20/20 1109 97.8 F (36.6 C)     Temp Source 03/20/20 1109 Oral     SpO2 03/20/20 1109 99 %     Weight 03/20/20 1107 201 lb (91.2 kg)     Height 03/20/20 1107 _0  (1.676 m)     Head Circumference --      Peak Flow --      Pain Score 03/20/20 1106 6     Pain Loc --      Pain Edu? --      Excl. in [place]? --    No data found.  Updated Vital Signs BP 126/80 (BP Location: Right Arm)   Pulse 60   Temp 97.8 F (36.6 C) (Oral)   Resp 18   Ht _1  (1.676 m)  Wt 201 lb (91.2 kg)   SpO2 99%   BMI 32.44 kg/m   Visual Acuity Right Eye Distance:   Left Eye Distance:   Bilateral Distance:    Right Eye Near:   Left Eye Near:    Bilateral Near:     Physical Exam Vitals and nursing note reviewed.  Constitutional:      General: She is not in acute distress.    Appearance: She is well-developed. She is not toxic-appearing.  HENT:     Head: Normocephalic and atraumatic.     Right Ear: Tympanic membrane and ear canal normal. No middle ear effusion. Tympanic membrane is not erythematous.     Left Ear: Tympanic membrane and ear canal normal.  No middle ear effusion. Tympanic membrane is not erythematous.     Nose: Congestion and rhinorrhea present.     Comments: Nasal mucosa is edematous and pale with a bluish hue and clear nasal discharge.    Mouth/Throat:     Mouth: Mucous membranes are moist.     Pharynx: Oropharynx is clear. Posterior oropharyngeal erythema present. No oropharyngeal exudate.     Tonsils: No tonsillar exudate. 0 on the right. 0 on the left.     Comments: Posterior oropharynx has mild erythema with clear postnasal drip. Eyes:     Extraocular Movements:     Right eye: Normal extraocular motion.     Left eye: Normal extraocular motion.     Pupils: Pupils are equal, round, and reactive to light.     Comments: Right eye bulbar and palpebral conjunctival are erythematous and injected.  There is yellow crusty  discharge in the inner canthus of the right eye.  Cardiovascular:     Rate and Rhythm: Normal rate and regular rhythm.     Heart sounds: Normal heart sounds. No murmur heard.  No gallop.   Pulmonary:     Effort: Pulmonary effort is normal.     Breath sounds: Normal breath sounds. No wheezing, rhonchi or rales.  Musculoskeletal:     Cervical back: Normal range of motion and neck supple.  Lymphadenopathy:     Cervical: No cervical adenopathy.  Skin:    General: Skin is warm and dry.     Capillary Refill: Capillary refill takes less than 2 seconds.     Findings: No rash.  Neurological:     General: No focal deficit present.     Mental Status: She is alert and oriented to person, place, and time.  Psychiatric:        Mood and Affect: Mood normal.        Behavior: Behavior normal.      UC Treatments / Results  Labs (all labs ordered are listed, but only abnormal results are displayed) Labs Reviewed - No data to display  EKG   Radiology No results found.  Procedures Procedures (including critical care time)  Medications Ordered in UC Medications - No data to display  Initial Impression / Assessment and Plan / UC Course  I have reviewed the triage vital signs and the nursing notes.  Pertinent labs & imaging results that were available during my care of the patient were reviewed by me and considered in my medical decision making (see chart for details).   Patient is here for evaluation of a sore throat that started 2 days ago and right eye redness with itching and drainage.  She has been treated for conjunctivitis in her right eye twice since September 1 time in her  left eye since September with ofloxacin drops.  She reports that the infection goes away and then comes right back.  She has not seen an eye doctor or had a culture done.  Physical exam is consistent with conjunctivitis of the right eye.  Plan to treat with Vigamox and have patient follow-up with her  ophthalmologist.  Pharynx is mildly erythematous with clear postnasal drip.  No tonsillar exudate or edema or erythema.  Sore throat most likely from postnasal drip.  We will have patient resume her Flonase, take Allegra daily, and perform sinus irrigation.   Final Clinical Impressions(s) / UC Diagnoses   Final diagnoses:  None     Discharge Instructions     Continue your oral allergy medicines.  Utilize your Flonase nightly, 2 squirts in each nostril, follow each set of actuations with 1 squirt of saline nasal spray as we discussed.  Perform sinus irrigation twice daily with warm distilled water and a NeilMed sinus rinse kit.  I would do this 20 minutes before you utilize your nasal spray at night.  Use the Vigamox drops, 1 drop in your right eye 3 times a day for 7 days.  If your symptoms return you need to follow-up with your ophthalmologist for culture.    ED Prescriptions    Medication Sig Dispense Auth. Provider   moxifloxacin (VIGAMOX) 0.5 % ophthalmic solution Place 1 drop into the right eye 3 (three) times daily. 3 mL Margarette Canada, NP     PDMP not reviewed this encounter.   Margarette Canada, NP 03/20/20 1142

## 2020-04-17 ENCOUNTER — Other Ambulatory Visit: Payer: Self-pay

## 2020-04-17 ENCOUNTER — Encounter: Payer: Self-pay | Admitting: Emergency Medicine

## 2020-04-17 ENCOUNTER — Ambulatory Visit
Admission: EM | Admit: 2020-04-17 | Discharge: 2020-04-17 | Disposition: A | Discharge status: Home / Self Care | Payer: BC Managed Care – PPO | Attending: Family Medicine | Admitting: Family Medicine

## 2020-04-17 DIAGNOSIS — J069 Acute upper respiratory infection, unspecified: Secondary | ICD-10-CM | POA: Diagnosis not present

## 2020-04-17 MED ORDER — BENZONATATE 200 MG PO CAPS: 200.0000 mg | ORAL_CAPSULE | Freq: Three times a day (TID) | ORAL | 0 refills | Status: DC | PRN

## 2020-04-17 MED ORDER — IPRATROPIUM BROMIDE 0.06 % NA SOLN: 2.0000 | Freq: Four times a day (QID) | NASAL | 0 refills | Status: DC | PRN

## 2020-04-17 NOTE — ED Triage Notes (Addendum)
Patient c/o greenish nasal drainage that started Saturday. She is also c/o sinus headache. She states she gets a sinus infection around this time every year. Patient declines COVID testing.

## 2020-04-17 NOTE — ED Provider Notes (Signed)
[facility]    CSN: [id number] Arrival date & time: 04/17/20  1112  History   Chief Complaint Chief Complaint  Patient presents with  . Nasal Congestion  . Facial Pain   HPI  59 year old female presents with the above complaints.  Patient reports that she has been sick since Saturday.  She reports sinus headache, postnasal drip.  Also reports cough.  She states that she has had green nasal discharge.  No fever.  No sick contacts.  She is taken Tylenol Sinus and Delsym without resolution.  Patient believes that she has sinusitis.  No relieving factors.  Declines Covid testing today.  Past Medical History:  Diagnosis Date  . Acid reflux    H/O PRIOR TO GASTRC BYPASS  . Anemia    H/O  . Anxiety   . Arthritis   . Bladder spasm   . Depression   . Headache    H/O MIGRAINES  . History of kidney stones   . Left ureteral stone   . Leg DVT (deep venous thromboembolism), acute (HCC)    x 2-both times after a surgery-went to see hematologist 2017 (Dr Grayland Ormond)  and no clotting disorder ever found  . Restless leg syndrome   . Sepsis ([place])    FROM KIDNEY STONES  . Sleep apnea    HAD GASTRIC BYPASS AND DOES NOT HAVE SLEEP APNEA  . Urge incontinence     Patient Active Problem List   Diagnosis Date Noted  . Anxiety 03/30/2019  . Depression 03/30/2019  . Migraine 03/30/2019  . Marginal ulcer 03/24/2017  . S/P gastric bypass 03/24/2017  . Sepsis ([place]) 11/15/2016  . Hydronephrosis 11/15/2016  . GERD (gastroesophageal reflux disease) 11/15/2016  . UTI (urinary tract infection) 11/15/2016  . Right leg DVT ([place]) 01/24/2016  . Acute deep vein thrombosis (DVT) of popliteal vein of right lower extremity ([place]) 01/04/2016  . Incomplete tear of right rotator cuff 11/22/2015  . Moderate obstructive sleep apnea 09/21/2015  . Kidney stones 09/10/2015  . Urge incontinence 09/10/2015  . Nocturia 09/10/2015  . Joint pain 09/05/2015  . S/P laparoscopic cholecystectomy  09/05/2015  . Paralabral cyst of shoulder, right, subsequent encounter 01/10/2015  . Rotator cuff tendinitis, right 01/10/2015  . Tear of right glenoid labrum 01/10/2015  . Acute ankle pain 11/16/2013  . Acute shoulder pain 11/16/2013    Past Surgical History:  Procedure Laterality Date  . ABDOMINAL HYSTERECTOMY     TOTAL ABDOMINAL HYSTERECTOMY W/ BILATERAL SALPINGOOPHORECTOMY  . BARIATRIC SURGERY  05/15/2016  . CHOLECYSTECTOMY    . ESOPHAGOGASTRODUODENOSCOPY  05/15/2016  . KIDNEY STONE SURGERY    . KNEE ARTHROSCOPY Right    X 2  . KNEE ARTHROSCOPY Right 11/11/2018   Procedure: Partial right knee arthroscopy,arthroscopic medial and lateral menisectomy, plica excision;  Surgeon: Hessie Knows, MD;  Location: [facility];  Service: Orthopedics;  Laterality: Right;  . KNEE SURGERY Left 2006  . SHOULDER ARTHROSCOPY WITH OPEN ROTATOR CUFF REPAIR Right 11/22/2015   Procedure: SHOULDER ARTHROSCOPY, DEBRIDEMENT, DECOMPRESSION, SLAP REPAIR, POSSIBLE BICEP TENODESIS;  Surgeon: Corky Mull, MD;  Location: [facility];  Service: Orthopedics;  Laterality: Right;  . SINUS EXPLORATION      OB History   No obstetric history on file.      Home Medications    Prior to Admission medications   Medication Sig Start Date End Date Taking? Authorizing Provider  fluticasone (FLONASE) 50 MCG/ACT nasal spray Place 2 sprays into both nostrils daily as needed for allergies.  07/05/16  Yes [provider]  levocetirizine (XYZAL) 5 MG tablet Take 5 mg by mouth every evening.   Yes [provider]  montelukast (SINGULAIR) 10 MG tablet Take 10 mg by mouth at bedtime.   Yes [provider]  Multiple Vitamin (MULTIVITAMIN) tablet Take 1 tablet by mouth daily.   Yes [provider]  benzonatate (TESSALON) 200 MG capsule Take 1 capsule (200 mg total) by mouth 3 (three) times daily as needed for cough. 04/17/20   Thersa Salt G, DO  ipratropium (ATROVENT) 0.06 % nasal spray Place 2  sprays into both nostrils 4 (four) times daily as needed for rhinitis. 04/17/20   Coral Spikes, DO  apixaban (ELIQUIS) 5 MG TABS tablet Take 1 tablet (5 mg total) by mouth 2 (two) times daily. 11/11/18 04/15/19  Hessie Knows, MD    Family History Family History  Problem Relation Age of Onset  . Cancer Mother        rectal  . Lung cancer Father   . Hematuria Brother   . Kidney disease Paternal Grandfather   . Kidney Stones Brother   . Bladder Cancer Neg Hx   . Kidney cancer Neg Hx     Social History Social History   Tobacco Use  . Smoking status: Never Smoker  . Smokeless tobacco: Never Used  Vaping Use  . Vaping Use: Never used  Substance Use Topics  . Alcohol use: No    Alcohol/week: 0.0 standard drinks  . Drug use: No     Allergies   Alcohol, Bacitracin-neomycin-polymyxin, Codeine, Hydrocodone, Hydrocortisone, Lanolin, Other, Tape, Tapentadol, Tramadol, Bacitracin, Clarithromycin, Latex, Nickel, Oxycodone, Penicillins, and Sulfamethoxazole-trimethoprim   Review of Systems Review of Systems Per HPI  Physical Exam Triage Vital Signs ED Triage Vitals  Enc Vitals Group     BP 04/17/20 1251 130/83     Pulse Rate 04/17/20 1251 67     Resp 04/17/20 1251 18     Temp 04/17/20 1251 98.3 F (36.8 C)     Temp Source 04/17/20 1251 Oral     SpO2 04/17/20 1251 100 %     Weight 04/17/20 1249 202 lb (91.6 kg)     Height 04/17/20 1249 5\' 6"  (1.676 m)     Head Circumference --      Peak Flow --      Pain Score 04/17/20 1249 3     Pain Loc --      Pain Edu? --      Excl. in [place]? --    Updated Vital Signs BP 130/83 (BP Location: Right Arm)   Pulse 67   Temp 98.3 F (36.8 C) (Oral)   Resp 18   Ht 5\' 6"  (1.676 m)   Wt 91.6 kg   SpO2 100%   BMI 32.60 kg/m   Visual Acuity Right Eye Distance:   Left Eye Distance:   Bilateral Distance:    Right Eye Near:   Left Eye Near:    Bilateral Near:     Physical Exam Vitals and nursing note reviewed.  Constitutional:        Appearance: Normal appearance. She is not ill-appearing.  HENT:     Head: Normocephalic and atraumatic.     Right Ear: Tympanic membrane normal.     Left Ear: Tympanic membrane normal.  Eyes:     General:        Right eye: No discharge.        Left eye: No discharge.  Conjunctiva/sclera: Conjunctivae normal.  Cardiovascular:     Rate and Rhythm: Normal rate and regular rhythm.     Heart sounds: No murmur heard.   Pulmonary:     Effort: Pulmonary effort is normal.     Breath sounds: Normal breath sounds. No wheezing, rhonchi or rales.  Neurological:     Mental Status: She is alert.  Psychiatric:        Mood and Affect: Mood normal.        Behavior: Behavior normal.    UC Treatments / Results  Labs (all labs ordered are listed, but only abnormal results are displayed) Labs Reviewed - No data to display  EKG   Radiology No results found.  Procedures Procedures (including critical care time)  Medications Ordered in UC Medications - No data to display  Initial Impression / Assessment and Plan / UC Course  I have reviewed the triage vital signs and the nursing notes.  Pertinent labs & imaging results that were available during my care of the patient were reviewed by me and considered in my medical decision making (see chart for details).    59 year old female presents with respiratory infection.  Suspect that this is viral.  Patient concerned regarding bacterial sinusitis as she has a history of this.  Treating conservatively with Atrovent and Tessalon Perles.  If fails to improve, she may start doxycycline (given as wait and see Rx).  Final Clinical Impressions(s) / UC Diagnoses   Final diagnoses:  Upper respiratory tract infection, unspecified type     Discharge Instructions     This is likely viral.  It is too soon to diagnosis bacterial sinusitis given your current presentation.   Take the medication as prescribed. You can continue to use Delsym as  well. If you don't improve in the next few days you can start the antibiotic.   Take care  Dr. Lacinda Axon    ED Prescriptions    Medication Sig Dispense Auth. Provider   ipratropium (ATROVENT) 0.06 % nasal spray Place 2 sprays into both nostrils 4 (four) times daily as needed for rhinitis. 15 mL Subrena Devereux G, DO   benzonatate (TESSALON) 200 MG capsule Take 1 capsule (200 mg total) by mouth 3 (three) times daily as needed for cough. 30 capsule Coral Spikes, DO     PDMP not reviewed this encounter.   Coral Spikes, [place] 04/17/20 1804

## 2020-04-17 NOTE — Discharge Instructions (Signed)
This is likely viral.  It is too soon to diagnosis bacterial sinusitis given your current presentation.   Take the medication as prescribed. You can continue to use Delsym as well. If you don't improve in the next few days you can start the antibiotic.   Take care  Dr. Lacinda Axon

## 2020-04-24 ENCOUNTER — Other Ambulatory Visit: Payer: Self-pay | Admitting: Family Medicine

## 2020-05-23 ENCOUNTER — Ambulatory Visit
Admission: EM | Admit: 2020-05-23 | Discharge: 2020-05-23 | Disposition: A | Discharge status: Home / Self Care | Payer: BC Managed Care – PPO | Attending: Sports Medicine | Admitting: Sports Medicine

## 2020-05-23 ENCOUNTER — Other Ambulatory Visit: Payer: Self-pay

## 2020-05-23 ENCOUNTER — Encounter: Payer: Self-pay | Admitting: Emergency Medicine

## 2020-05-23 DIAGNOSIS — J069 Acute upper respiratory infection, unspecified: Secondary | ICD-10-CM

## 2020-05-23 DIAGNOSIS — R0981 Nasal congestion: Secondary | ICD-10-CM | POA: Diagnosis not present

## 2020-05-23 DIAGNOSIS — U071 COVID-19: Secondary | ICD-10-CM | POA: Insufficient documentation

## 2020-05-23 DIAGNOSIS — M791 Myalgia, unspecified site: Secondary | ICD-10-CM | POA: Diagnosis not present

## 2020-05-23 LAB — SARS CORONAVIRUS 2 (TAT 6-24 HRS): SARS Coronavirus 2: POSITIVE — AB

## 2020-05-23 MED ORDER — BENZONATATE 200 MG PO CAPS: 200.0000 mg | ORAL_CAPSULE | Freq: Three times a day (TID) | ORAL | 0 refills | Status: DC | PRN

## 2020-05-23 NOTE — ED Triage Notes (Signed)
Patient states she has been sick on/off since October. She is c/o cough, nasal congestion that started 7 days ago.

## 2020-05-23 NOTE — ED Provider Notes (Signed)
[facility]    CSN: [id number] Arrival date & time: 05/23/20  0956      History   Chief Complaint Chief Complaint  Patient presents with  . Cough    [id number]  . Nasal Congestion    HPI Sharon [name] is a 60 y.o. female.   Patient pleasant 60 year old female who presents for evaluation of persistent cough congestion and drainage now since October.  She has been seen several times here and prescribed inhaler, Tessalon Perles, and at the last visit a rescue doxycycline prescription which she did fill.  She says she felt a little bit better on it but her symptoms have really never fully improved.  She has been vaccinated against COVID.  No influenza vaccine.  She denies any COVID exposure.  No COVID history.  She does have a history of allergies and is trying to get into her allergist but needs a negative COVID test in order to make an appointment.  She is also been using some Sudafed.  She denies any chest pain or shortness of breath.  No fever shakes chills.  She has had congestion and drainage with a cough and body aches.  Now associated laryngitis.     Past Medical History:  Diagnosis Date  . Acid reflux    H/O PRIOR TO GASTRC BYPASS  . Anemia    H/O  . Anxiety   . Arthritis   . Bladder spasm   . Depression   . Headache    H/O MIGRAINES  . History of kidney stones   . Left ureteral stone   . Leg DVT (deep venous thromboembolism), acute (HCC)    x 2-both times after a surgery-went to see hematologist 2017 (Dr Orlie Dakin)  and no clotting disorder ever found  . Restless leg syndrome   . Sepsis (HCC)    FROM KIDNEY STONES  . Sleep apnea    HAD GASTRIC BYPASS AND DOES NOT HAVE SLEEP APNEA  . Urge incontinence     Patient Active Problem List   Diagnosis Date Noted  . Anxiety 03/30/2019  . Depression 03/30/2019  . Migraine 03/30/2019  . Marginal ulcer 03/24/2017  . S/P gastric bypass 03/24/2017  . Sepsis (HCC) 11/15/2016  . Hydronephrosis  11/15/2016  . GERD (gastroesophageal reflux disease) 11/15/2016  . UTI (urinary tract infection) 11/15/2016  . Right leg DVT (HCC) 01/24/2016  . Acute deep vein thrombosis (DVT) of popliteal vein of right lower extremity (HCC) 01/04/2016  . Incomplete tear of right rotator cuff 11/22/2015  . Moderate obstructive sleep apnea 09/21/2015  . Kidney stones 09/10/2015  . Urge incontinence 09/10/2015  . Nocturia 09/10/2015  . Joint pain 09/05/2015  . S/P laparoscopic cholecystectomy 09/05/2015  . Paralabral cyst of shoulder, right, subsequent encounter 01/10/2015  . Rotator cuff tendinitis, right 01/10/2015  . Tear of right glenoid labrum 01/10/2015  . Acute ankle pain 11/16/2013  . Acute shoulder pain 11/16/2013    Past Surgical History:  Procedure Laterality Date  . ABDOMINAL HYSTERECTOMY     TOTAL ABDOMINAL HYSTERECTOMY W/ BILATERAL SALPINGOOPHORECTOMY  . BARIATRIC SURGERY  05/15/2016  . CHOLECYSTECTOMY    . ESOPHAGOGASTRODUODENOSCOPY  05/15/2016  . KIDNEY STONE SURGERY    . KNEE ARTHROSCOPY Right    X 2  . KNEE ARTHROSCOPY Right 11/11/2018   Procedure: Partial right knee arthroscopy,arthroscopic medial and lateral menisectomy, plica excision;  Surgeon: Kennedy Bucker, MD;  Location: [facility];  Service: Orthopedics;  Laterality: Right;  . KNEE SURGERY Left 2006  .  SHOULDER ARTHROSCOPY WITH OPEN ROTATOR CUFF REPAIR Right 11/22/2015   Procedure: SHOULDER ARTHROSCOPY, DEBRIDEMENT, DECOMPRESSION, SLAP REPAIR, POSSIBLE BICEP TENODESIS;  Surgeon: Corky Mull, MD;  Location: [facility];  Service: Orthopedics;  Laterality: Right;  . SINUS EXPLORATION      OB History   No obstetric history on file.      Home Medications    Prior to Admission medications   Medication Sig Start Date End Date Taking? Authorizing Provider  fluticasone (FLONASE) 50 MCG/ACT nasal spray Place 2 sprays into both nostrils daily as needed for allergies.  07/05/16  Yes [provider]  ipratropium  (ATROVENT) 0.06 % nasal spray Place 2 sprays into both nostrils 4 (four) times daily as needed for rhinitis. 04/17/20  Yes Cook, Jayce G, DO  levocetirizine (XYZAL) 5 MG tablet Take 5 mg by mouth every evening.   Yes [provider]  montelukast (SINGULAIR) 10 MG tablet Take 10 mg by mouth at bedtime.   Yes [provider]  Multiple Vitamin (MULTIVITAMIN) tablet Take 1 tablet by mouth daily.   Yes [provider]  benzonatate (TESSALON) 200 MG capsule Take 1 capsule (200 mg total) by mouth 3 (three) times daily as needed for cough. 05/23/20   Verda Cumins, MD  apixaban (ELIQUIS) 5 MG TABS tablet Take 1 tablet (5 mg total) by mouth 2 (two) times daily. 11/11/18 04/15/19  Hessie Knows, MD    Family History Family History  Problem Relation Age of Onset  . Cancer Mother        rectal  . Lung cancer Father   . Hematuria Brother   . Kidney disease Paternal Grandfather   . Kidney Stones Brother   . Bladder Cancer Neg Hx   . Kidney cancer Neg Hx     Social History Social History   Tobacco Use  . Smoking status: Never Smoker  . Smokeless tobacco: Never Used  Vaping Use  . Vaping Use: Never used  Substance Use Topics  . Alcohol use: No    Alcohol/week: 0.0 standard drinks  . Drug use: No     Allergies   Alcohol, Bacitracin-neomycin-polymyxin, Codeine, Hydrocodone, Hydrocortisone, Lanolin, Other, Tape, Tapentadol, Tramadol, Bacitracin, Clarithromycin, Latex, Nickel, Oxycodone, Penicillins, and Sulfamethoxazole-trimethoprim   Review of Systems Review of Systems  Constitutional: Negative for activity change, appetite change, chills, diaphoresis, fatigue and fever.  HENT: Positive for congestion and postnasal drip. Negative for ear pain, rhinorrhea, sinus pressure, sinus pain and sore throat.   Eyes: Negative for pain.  Respiratory: Positive for cough. Negative for apnea, chest tightness, shortness of breath, wheezing and stridor.   Genitourinary: Positive  for dysuria.  Musculoskeletal: Positive for myalgias.  Neurological: Positive for headaches. Negative for dizziness, syncope, light-headedness and numbness.  All other systems reviewed and are negative.    Physical Exam Triage Vital Signs ED Triage Vitals  Enc Vitals Group     BP 05/23/20 1116 120/79     Pulse Rate 05/23/20 1116 91     Resp 05/23/20 1116 18     Temp 05/23/20 1116 98.4 F (36.9 C)     Temp Source 05/23/20 1116 Oral     SpO2 05/23/20 1116 99 %     Weight 05/23/20 1034 202 lb (91.6 kg)     Height 05/23/20 1034 5\' 6"  (1.676 m)     Head Circumference --      Peak Flow --      Pain Score 05/23/20 1034 0     Pain Loc --  Pain Edu? --      Excl. in [place]? --    No data found.  Updated Vital Signs BP 120/79 (BP Location: Right Arm)   Pulse 91   Temp 98.4 F (36.9 C) (Oral)   Resp 18   Ht 5\' 6"  (1.676 m)   Wt 91.6 kg   SpO2 99%   BMI 32.60 kg/m   Visual Acuity Right Eye Distance:   Left Eye Distance:   Bilateral Distance:    Right Eye Near:   Left Eye Near:    Bilateral Near:     Physical Exam Vitals and nursing note reviewed.  Constitutional:      General: She is not in acute distress.    Appearance: Normal appearance. She is not ill-appearing or toxic-appearing.  HENT:     Head: Normocephalic and atraumatic.     Right Ear: Tympanic membrane normal.     Left Ear: Tympanic membrane normal.     Nose: Nose normal. No congestion or rhinorrhea.     Mouth/Throat:     Mouth: Mucous membranes are moist.     Pharynx: Posterior oropharyngeal erythema present. No oropharyngeal exudate.  Eyes:     Extraocular Movements: Extraocular movements intact.     Conjunctiva/sclera: Conjunctivae normal.     Pupils: Pupils are equal, round, and reactive to light.  Cardiovascular:     Rate and Rhythm: Normal rate and regular rhythm.     Pulses: Normal pulses.     Heart sounds: Normal heart sounds. No murmur heard. No friction rub. No gallop.   Pulmonary:      Effort: Pulmonary effort is normal. No respiratory distress.     Breath sounds: Normal breath sounds. No stridor. No wheezing, rhonchi or rales.  Musculoskeletal:     Cervical back: Normal range of motion and neck supple. No rigidity.  Lymphadenopathy:     Cervical: Cervical adenopathy present.  Skin:    General: Skin is warm and dry.     Capillary Refill: Capillary refill takes less than 2 seconds.  Neurological:     General: No focal deficit present.     Mental Status: She is oriented to person, place, and time.  Psychiatric:        Mood and Affect: Mood normal.        Behavior: Behavior normal.      UC Treatments / Results  Labs (all labs ordered are listed, but only abnormal results are displayed) Labs Reviewed  SARS CORONAVIRUS 2 (TAT 6-24 HRS)    EKG   Radiology No results found.  Procedures Procedures (including critical care time)  Medications Ordered in UC Medications - No data to display  Initial Impression / Assessment and Plan / UC Course  I have reviewed the triage vital signs and the nursing notes.  Pertinent labs & imaging results that were available during my care of the patient were reviewed by me and considered in my medical decision making (see chart for details).   Clinical impression: 60 year old female with persistent upper respiratory complaints.  She has been on Atrovent as well as doxycycline.  She is waiting to get into allergy to see if it is an allergic process.  Needs a COVID test today to be able to make the appointment.  She presents now with recurrence of 1 week of cough body aches congestion and nasal drainage.  Treatment plan: 1.  The findings and treatment plan were discussed in detail with the patient.  The patient was in  agreement. 2.  We will go ahead and COVID test her today.  We do not have any rapid test.  I will be sent to the hospital.  Her results will not be available for 24 to 48 hours.  She will be called only if her test  is positive.  She can check on MyChart for her status and if it is negative and printed out and bring it to her allergy appointment. 3.  We will give her a work note keeping her out of work until she has a documented negative test.  She needs to quarantine per CDC guidelines.   4.  I gave her a refill on her prescription for Tessalon Perles. 5.  Supportive care, over-the-counter meds as needed, Tylenol or Motrin for fever discomfort. 6.  Certainly if she develops any worsening shortness of breath or anything of concern from a cardiac perspective she should call 911 or go to the nearest emergency room.  Otherwise we will see her back as needed.      Final Clinical Impressions(s) / UC Diagnoses   Final diagnoses:  Viral URI with cough  Nasal congestion  Myalgia     Discharge Instructions     We will go ahead and COVID test her today.  We do not have any rapid test.  I will be sent to the hospital.  Her results will not be available for 24 to 48 hours.  She will be called only if her test is positive.  She can check on MyChart for her status and if it is negative and printed out and bring it to her allergy appointment. We will give her a work note keeping her out of work until she has a documented negative test.  She needs to quarantine per CDC guidelines.   I gave her a refill on her prescription for Tessalon Perles. Supportive care, over-the-counter meds as needed, Tylenol or Motrin for fever discomfort. Certainly if she develops any worsening shortness of breath or anything of concern from a cardiac perspective she should call 911 or go to the nearest emergency room.  Otherwise we will see her back as needed.    ED Prescriptions    Medication Sig Dispense Auth. Provider   benzonatate (TESSALON) 200 MG capsule Take 1 capsule (200 mg total) by mouth 3 (three) times daily as needed for cough. 30 capsule Verda Cumins, MD     PDMP not reviewed this encounter.   Verda Cumins,  MD 05/23/20 1204

## 2020-05-23 NOTE — Discharge Instructions (Addendum)
We will go ahead and COVID test her today.  We do not have any rapid test.  I will be sent to the hospital.  Her results will not be available for 24 to 48 hours.  She will be called only if her test is positive.  She can check on MyChart for her status and if it is negative and printed out and bring it to her allergy appointment. We will give her a work note keeping her out of work until she has a documented negative test.  She needs to quarantine per CDC guidelines.   I gave her a refill on her prescription for Tessalon Perles. Supportive care, over-the-counter meds as needed, Tylenol or Motrin for fever discomfort. Certainly if she develops any worsening shortness of breath or anything of concern from a cardiac perspective she should call 911 or go to the nearest emergency room.  Otherwise we will see her back as needed.

## 2020-06-12 ENCOUNTER — Ambulatory Visit: Payer: Self-pay | Admitting: Physician Assistant

## 2020-06-27 ENCOUNTER — Ambulatory Visit: Payer: Self-pay | Admitting: Physician Assistant

## 2020-08-17 ENCOUNTER — Ambulatory Visit: Payer: BC Managed Care – PPO | Admitting: Physician Assistant

## 2020-08-17 ENCOUNTER — Other Ambulatory Visit: Payer: Self-pay

## 2020-08-17 ENCOUNTER — Encounter: Payer: Self-pay | Admitting: Physician Assistant

## 2020-08-17 VITALS — BP 119/80 | HR 71 | Temp 97.6°F | Ht 66.0 in | Wt 210.0 lb

## 2020-08-17 DIAGNOSIS — R350 Frequency of micturition: Secondary | ICD-10-CM | POA: Diagnosis not present

## 2020-08-17 DIAGNOSIS — R3989 Other symptoms and signs involving the genitourinary system: Secondary | ICD-10-CM | POA: Diagnosis not present

## 2020-08-17 LAB — URINALYSIS, COMPLETE
Bilirubin, UA: NEGATIVE
Glucose, UA: NEGATIVE
Ketones, UA: NEGATIVE
Nitrite, UA: NEGATIVE
Protein,UA: NEGATIVE
RBC, UA: NEGATIVE
Specific Gravity, UA: 1.02 (ref 1.005–1.030)
Urobilinogen, Ur: 1 mg/dL (ref 0.2–1.0)
pH, UA: 6 (ref 5.0–7.5)

## 2020-08-17 LAB — MICROSCOPIC EXAMINATION: Bacteria, UA: NONE SEEN

## 2020-08-17 LAB — BLADDER SCAN AMB NON-IMAGING

## 2020-08-17 MED ORDER — CIPROFLOXACIN HCL 250 MG PO TABS: 250.0000 mg | ORAL_TABLET | Freq: Two times a day (BID) | ORAL | 0 refills | Status: AC

## 2020-08-17 NOTE — Progress Notes (Signed)
08/17/2020 12:07 PM   Sharon [name] 1960-12-13 [id number]  CC: Chief Complaint  Patient presents with  . Urinary Frequency    HPI: Sharon [name] is a 60 y.o. female with PMH recurrent nephrolithiasis with urosepsis, recurrent UTI, and OAB wet who presents today for evaluation of possible UTI.  She was previously on suppressive Macrobid and trospium for management of recurrent UTI and OAB, respectively, however it appears she is no longer taking either of these.  Today she reports an approximate 4-day history of worsened frequency and urgency, low back pain, bladder pressure, chills, and increased urinary incontinence.  She denies flank pain, gross hematuria, fever, nausea, and vomiting.  She wonders today for symptoms could represent bladder cancer.  She underwent CT stone study on 01/27/2018 with no urolithiasis.  KUB dated 03/30/2019 with no radiopaque stones.  She underwent cystoscopy with Dr. Erlene Quan on 12/07/2019 with no significant findings.  In-office UA today positive for 1+ leukocyte esterase; urine microscopy with 11-30 WBCs/HPF. PVR 44mL.  PMH: Past Medical History:  Diagnosis Date  . Acid reflux    H/O PRIOR TO GASTRC BYPASS  . Anemia    H/O  . Anxiety   . Arthritis   . Bladder spasm   . Depression   . Headache    H/O MIGRAINES  . History of kidney stones   . Left ureteral stone   . Leg DVT (deep venous thromboembolism), acute ([facility])    x 2-both times after a surgery-went to see hematologist 2017 (Dr Grayland Ormond)  and no clotting disorder ever found  . Restless leg syndrome   . Sepsis ([place])    FROM KIDNEY STONES  . Sleep apnea    HAD GASTRIC BYPASS AND DOES NOT HAVE SLEEP APNEA  . Urge incontinence     Surgical History: Past Surgical History:  Procedure Laterality Date  . ABDOMINAL HYSTERECTOMY     TOTAL ABDOMINAL HYSTERECTOMY W/ BILATERAL SALPINGOOPHORECTOMY  . BARIATRIC SURGERY  05/15/2016  . CHOLECYSTECTOMY    . ESOPHAGOGASTRODUODENOSCOPY   05/15/2016  . KIDNEY STONE SURGERY    . KNEE ARTHROSCOPY Right    X 2  . KNEE ARTHROSCOPY Right 11/11/2018   Procedure: Partial right knee arthroscopy,arthroscopic medial and lateral menisectomy, plica excision;  Surgeon: Hessie Knows, MD;  Location: [facility];  Service: Orthopedics;  Laterality: Right;  . KNEE SURGERY Left 2006  . SHOULDER ARTHROSCOPY WITH OPEN ROTATOR CUFF REPAIR Right 11/22/2015   Procedure: SHOULDER ARTHROSCOPY, DEBRIDEMENT, DECOMPRESSION, SLAP REPAIR, POSSIBLE BICEP TENODESIS;  Surgeon: Corky Mull, MD;  Location: [facility];  Service: Orthopedics;  Laterality: Right;  . SINUS EXPLORATION      Home Medications:  Allergies as of 08/17/2020      Reactions   Alcohol Other (See Comments)   wool   Bacitracin-neomycin-polymyxin Other (See Comments)   Codeine Nausea Only, Other (See Comments)   "headache"   Hydrocodone Other (See Comments)   Hydrocortisone Other (See Comments)   Lanolin Other (See Comments)   "rash" Other reaction(s): Unknown   Other Other (See Comments)   Bitartrate/black rubber dye/elastic Other reaction(s): Unknown   Tape Other (See Comments)   Other reaction(s): Unknown-OK TO USE PAPER TAPE   Tapentadol Other (See Comments)   Tramadol Other (See Comments)   "headache"   Bacitracin Rash   Clarithromycin Rash   Latex Rash   Nickel Rash, Other (See Comments)   Oxycodone Rash   Penicillins Rash   Has patient had a PCN reaction causing immediate rash,  facial/tongue/throat swelling, SOB or lightheadedness with hypotension: yes Has patient had a PCN reaction causing severe rash involving mucus membranes or skin necrosis: no Has patient had a PCN reaction that required hospitalization No Has patient had a PCN reaction occurring within the last 10 years: No If all of the above answers are "NO", then may proceed with Cephalosporin use.   Sulfamethoxazole-trimethoprim Rash      Medication List       Accurate as of August 17, 2020 12:07 PM. If you  have any questions, ask your nurse or doctor.        benzonatate 200 MG capsule Commonly known as: TESSALON Take 1 capsule (200 mg total) by mouth 3 (three) times daily as needed for cough.   fluticasone 50 MCG/ACT nasal spray Commonly known as: FLONASE Place 2 sprays into both nostrils daily as needed for allergies.   ipratropium 0.06 % nasal spray Commonly known as: ATROVENT Place 2 sprays into both nostrils 4 (four) times daily as needed for rhinitis.   levocetirizine 5 MG tablet Commonly known as: XYZAL Take 5 mg by mouth every evening.   montelukast 10 MG tablet Commonly known as: SINGULAIR Take 10 mg by mouth at bedtime.   multivitamin tablet Take 1 tablet by mouth daily.       Allergies:  Allergies  Allergen Reactions  . Alcohol Other (See Comments)    wool  . Bacitracin-Neomycin-Polymyxin Other (See Comments)  . Codeine Nausea Only and Other (See Comments)    "headache"  . Hydrocodone Other (See Comments)  . Hydrocortisone Other (See Comments)  . Lanolin Other (See Comments)    "rash" Other reaction(s): Unknown  . Other Other (See Comments)    Bitartrate/black rubber dye/elastic Other reaction(s): Unknown  . Tape Other (See Comments)    Other reaction(s): Unknown-OK TO USE PAPER TAPE  . Tapentadol Other (See Comments)  . Tramadol Other (See Comments)    "headache"  . Bacitracin Rash  . Clarithromycin Rash  . Latex Rash  . Nickel Rash and Other (See Comments)  . Oxycodone Rash  . Penicillins Rash    Has patient had a PCN reaction causing immediate rash, facial/tongue/throat swelling, SOB or lightheadedness with hypotension: yes Has patient had a PCN reaction causing severe rash involving mucus membranes or skin necrosis: no Has patient had a PCN reaction that required hospitalization No Has patient had a PCN reaction occurring within the last 10 years: No If all of the above answers are "NO", then may proceed with Cephalosporin use.   .  Sulfamethoxazole-Trimethoprim Rash    Family History: Family History  Problem Relation Age of Onset  . Cancer Mother        rectal  . Lung cancer Father   . Hematuria Brother   . Kidney disease Paternal Grandfather   . Kidney Stones Brother   . Bladder Cancer Neg Hx   . Kidney cancer Neg Hx     Social History:   reports that she has never smoked. She has never used smokeless tobacco. She reports that she does not drink alcohol and does not use drugs.  Physical Exam: BP 119/80   Pulse 71   Temp 97.6 F (36.4 C) (Oral)   Ht 5\' 6"  (1.676 m)   Wt 210 lb (95.3 kg)   SpO2 99%   BMI 33.89 kg/m   Constitutional:  Alert and oriented, no acute distress, nontoxic appearing HEENT: [place], AT Cardiovascular: No clubbing, cyanosis, or edema Respiratory: Normal respiratory effort, no  increased work of breathing Skin: No rashes, bruises or suspicious lesions Neurologic: Grossly intact, no focal deficits, moving all 4 extremities Psychiatric: Normal mood and affect  Laboratory Data: Results for orders placed or performed in visit on 08/17/20  Microscopic Examination   Urine  Result Value Ref Range   WBC, UA 11-30 (A) 0 - 5 /hpf   RBC 0-2 0 - 2 /hpf   Epithelial Cells (non renal) 0-10 0 - 10 /hpf   Renal Epithel, UA 0-10 (A) None seen /hpf   Bacteria, UA None seen None seen/Few  Urinalysis, Complete  Result Value Ref Range   Specific Gravity, UA 1.020 1.005 - 1.030   pH, UA 6.0 5.0 - 7.5   Color, UA Yellow Yellow   Appearance Ur Hazy (A) Clear   Leukocytes,UA 1+ (A) Negative   Protein,UA Negative Negative/Trace   Glucose, UA Negative Negative   Ketones, UA Negative Negative   RBC, UA Negative Negative   Bilirubin, UA Negative Negative   Urobilinogen, Ur 1.0 0.2 - 1.0 mg/dL   Nitrite, UA Negative Negative   Microscopic Examination See below:   Bladder Scan (Post Void Residual) in office  Result Value Ref Range   Scan Result 54mL    Assessment & Plan:   1. Sensation of  pressure in bladder area UA today notable for pyuria.  VSS.  PVR WNL.  Will start empiric Cipro and send for culture for further evaluation.  If her symptoms do not improve and/or urine culture is negative, recommend resuming trospium for management of her OAB.  I reassured the patient that I am not concerned for bladder cancer today given her recent negative cystoscopy, acute symptom presentation, and the absence of microscopic hematuria.  She expressed understanding. - Urinalysis, Complete - Bladder Scan (Post Void Residual) in office - CULTURE, URINE COMPREHENSIVE - ciprofloxacin (CIPRO) 250 MG tablet; Take 1 tablet (250 mg total) by mouth 2 (two) times daily for 5 days.  Dispense: 10 tablet; Refill: 0   Return if symptoms worsen or fail to improve.  Debroah Loop, PA-C  [facility] [address] [place], [place] [address] [phone]

## 2020-08-24 LAB — CULTURE, URINE COMPREHENSIVE

## 2020-12-19 ENCOUNTER — Other Ambulatory Visit: Payer: Self-pay

## 2020-12-19 ENCOUNTER — Ambulatory Visit
Admission: EM | Admit: 2020-12-19 | Discharge: 2020-12-19 | Disposition: A | Discharge status: Home / Self Care | Payer: BC Managed Care – PPO | Attending: Sports Medicine | Admitting: Sports Medicine

## 2020-12-19 DIAGNOSIS — R0982 Postnasal drip: Secondary | ICD-10-CM

## 2020-12-19 DIAGNOSIS — R0981 Nasal congestion: Secondary | ICD-10-CM

## 2020-12-19 DIAGNOSIS — J320 Chronic maxillary sinusitis: Secondary | ICD-10-CM

## 2020-12-19 DIAGNOSIS — R059 Cough, unspecified: Secondary | ICD-10-CM

## 2020-12-19 MED ORDER — AZITHROMYCIN 250 MG PO TABS
250.0000 mg | ORAL_TABLET | Freq: Every day | ORAL | 0 refills | Status: DC
Start: 2020-12-19 — End: 2021-04-08

## 2020-12-19 NOTE — ED Triage Notes (Signed)
Patient states that she has been having sinus congestion, ear pain, facial pain and pressure x 10 days. States that she is also coughing and has been worsening over last 10 days

## 2020-12-19 NOTE — ED Provider Notes (Signed)
[facility]    CSN: [id number] Arrival date & time: 12/19/20  1017      History   Chief Complaint Chief Complaint  Patient presents with   Nasal Congestion    HPI Sharon [name] is a 60 y.o. female.   60 year old female who presents for evaluation of sinus pressure and congestion for 2 weeks.  She is also complaining of some ear pressure on the right side.  No fever shakes chills.  No nausea vomiting diarrhea.  She says she has a little bit of a cough that is worse at nighttime.  She does describe postnasal drip.  She has an ENT but he is on vacation and she cannot be seen.  She also has an allergist and is on Singulair as well as Flonase.  She has been using some over-the-counter meds but given the fact this been going on for 2 weeks she is concerned she may have a bacterial infection.  Complicating her situation is she has multiple medication allergies.  She is taken 3 home COVID tests and they have all been negative.  She reports that she has been unable to purchase any Delsym because all of the stores are out of stock.  She denies any chest pain shortness of breath.  No red flag signs or symptoms elicited on history.   Past Medical History:  Diagnosis Date   Acid reflux    H/O PRIOR TO GASTRC BYPASS   Anemia    H/O   Anxiety    Arthritis    Bladder spasm    Depression    Headache    H/O MIGRAINES   History of kidney stones    Left ureteral stone    Leg DVT (deep venous thromboembolism), acute ([place])    x 2-both times after a surgery-went to see hematologist 2017 (Dr Grayland Ormond)  and no clotting disorder ever found   Restless leg syndrome    Sepsis ([place])    FROM KIDNEY STONES   Sleep apnea    HAD GASTRIC BYPASS AND DOES NOT HAVE SLEEP APNEA   Urge incontinence     Patient Active Problem List   Diagnosis Date Noted   Anxiety 03/30/2019   Depression 03/30/2019   Migraine 03/30/2019   Marginal ulcer 03/24/2017   S/P gastric bypass 03/24/2017   Sepsis  ([place]) 11/15/2016   Hydronephrosis 11/15/2016   GERD (gastroesophageal reflux disease) 11/15/2016   UTI (urinary tract infection) 11/15/2016   Right leg DVT ([place]) 01/24/2016   Acute deep vein thrombosis (DVT) of popliteal vein of right lower extremity ([place]) 01/04/2016   Incomplete tear of right rotator cuff 11/22/2015   Moderate obstructive sleep apnea 09/21/2015   Kidney stones 09/10/2015   Urge incontinence 09/10/2015   Nocturia 09/10/2015   Joint pain 09/05/2015   S/P laparoscopic cholecystectomy 09/05/2015   Paralabral cyst of shoulder, right, subsequent encounter 01/10/2015   Rotator cuff tendinitis, right 01/10/2015   Tear of right glenoid labrum 01/10/2015   Acute ankle pain 11/16/2013   Acute shoulder pain 11/16/2013    Past Surgical History:  Procedure Laterality Date   ABDOMINAL HYSTERECTOMY     TOTAL ABDOMINAL HYSTERECTOMY W/ BILATERAL SALPINGOOPHORECTOMY   BARIATRIC SURGERY  05/15/2016   CHOLECYSTECTOMY     ESOPHAGOGASTRODUODENOSCOPY  05/15/2016   KIDNEY STONE SURGERY     KNEE ARTHROSCOPY Right    X 2   KNEE ARTHROSCOPY Right 11/11/2018   Procedure: Partial right knee arthroscopy,arthroscopic medial and lateral menisectomy, plica excision;  Surgeon:  Hessie Knows, MD;  Location: [facility];  Service: Orthopedics;  Laterality: Right;   KNEE SURGERY Left 2006   SHOULDER ARTHROSCOPY WITH OPEN ROTATOR CUFF REPAIR Right 11/22/2015   Procedure: SHOULDER ARTHROSCOPY, DEBRIDEMENT, DECOMPRESSION, SLAP REPAIR, POSSIBLE BICEP TENODESIS;  Surgeon: Corky Mull, MD;  Location: [facility];  Service: Orthopedics;  Laterality: Right;   SINUS EXPLORATION      OB History   No obstetric history on file.      Home Medications    Prior to Admission medications   Medication Sig Start Date End Date Taking? Authorizing Provider  azithromycin (ZITHROMAX) 250 MG tablet Take 1 tablet (250 mg total) by mouth daily. Take first 2 tablets together, then 1 every day until finished. 12/19/20  Yes  Verda Cumins, MD  levocetirizine (XYZAL) 5 MG tablet Take 5 mg by mouth every evening.   Yes [provider]  montelukast (SINGULAIR) 10 MG tablet Take 10 mg by mouth at bedtime.   Yes [provider]  Multiple Vitamin (MULTIVITAMIN) tablet Take 1 tablet by mouth daily.   Yes [provider]  benzonatate (TESSALON) 200 MG capsule Take 1 capsule (200 mg total) by mouth 3 (three) times daily as needed for cough. Patient not taking: No sig reported 05/23/20   Verda Cumins, MD  fluticasone [facility]) 50 MCG/ACT nasal spray Place 2 sprays into both nostrils daily as needed for allergies.  Patient not taking: No sig reported 07/05/16   [provider]  ipratropium (ATROVENT) 0.06 % nasal spray Place 2 sprays into both nostrils 4 (four) times daily as needed for rhinitis. Patient not taking: No sig reported 04/17/20   Coral Spikes, DO  apixaban (ELIQUIS) 5 MG TABS tablet Take 1 tablet (5 mg total) by mouth 2 (two) times daily. 11/11/18 04/15/19  Hessie Knows, MD    Family History Family History  Problem Relation Age of Onset   Cancer Mother        rectal   Lung cancer Father    Hematuria Brother    Kidney disease Paternal Grandfather    Kidney Stones Brother    Bladder Cancer Neg Hx    Kidney cancer Neg Hx     Social History Social History   Tobacco Use   Smoking status: Never   Smokeless tobacco: Never  Vaping Use   Vaping Use: Never used  Substance Use Topics   Alcohol use: No    Alcohol/week: 0.0 standard drinks   Drug use: No     Allergies   Alcohol, Bacitracin-neomycin-polymyxin, Codeine, Hydrocodone, Hydrocortisone, Lanolin, Other, Tape, Tapentadol, Tramadol, Bacitracin, Clarithromycin, Latex, Nickel, Oxycodone, Penicillins, and Sulfamethoxazole-trimethoprim   Review of Systems Review of Systems  Constitutional:  Negative for activity change, appetite change, chills, diaphoresis, fatigue and fever.  HENT:  Positive for congestion,  ear pain, postnasal drip and sinus pressure. Negative for ear discharge, rhinorrhea, sinus pain, sneezing and sore throat.   Eyes:  Negative for pain.  Respiratory:  Positive for cough. Negative for chest tightness, shortness of breath and wheezing.   Cardiovascular:  Negative for chest pain and palpitations.  Gastrointestinal:  Negative for abdominal pain, diarrhea, nausea and vomiting.  Genitourinary:  Negative for dysuria.  Musculoskeletal:  Negative for back pain, myalgias and neck pain.  Skin:  Negative for color change, pallor, rash and wound.  Neurological:  Negative for dizziness, syncope, light-headedness and headaches.  All other systems reviewed and are negative.   Physical Exam Triage Vital Signs ED Triage Vitals  Enc  Vitals Group     BP 12/19/20 1135 (!) 161/93     Pulse Rate 12/19/20 1135 87     Resp 12/19/20 1135 18     Temp 12/19/20 1135 98.2 F (36.8 C)     Temp Source 12/19/20 1135 Oral     SpO2 12/19/20 1135 97 %     Weight 12/19/20 1132 204 lb (92.5 kg)     Height 12/19/20 1132 '5\' 6"'$  (1.676 m)     Head Circumference --      Peak Flow --      Pain Score 12/19/20 1132 7     Pain Loc --      Pain Edu? --      Excl. in [place]? --    No data found.  Updated Vital Signs BP (!) 161/93 (BP Location: Right Arm)   Pulse 87   Temp 98.2 F (36.8 C) (Oral)   Resp 18   Ht '5\' 6"'$  (1.676 m)   Wt 92.5 kg   SpO2 97%   BMI 32.93 kg/m   Visual Acuity Right Eye Distance:   Left Eye Distance:   Bilateral Distance:    Right Eye Near:   Left Eye Near:    Bilateral Near:     Physical Exam Vitals and nursing note reviewed.  Constitutional:      General: She is not in acute distress.    Appearance: Normal appearance. She is not ill-appearing, toxic-appearing or diaphoretic.  HENT:     Head: Normocephalic and atraumatic.     Right Ear: Tympanic membrane normal.     Left Ear: Tympanic membrane normal.     Nose: Congestion present. No rhinorrhea.     Mouth/Throat:      Mouth: Mucous membranes are moist.     Pharynx: No oropharyngeal exudate or posterior oropharyngeal erythema.  Eyes:     General: No scleral icterus.       Right eye: No discharge.        Left eye: No discharge.     Extraocular Movements: Extraocular movements intact.     Conjunctiva/sclera: Conjunctivae normal.     Pupils: Pupils are equal, round, and reactive to light.  Cardiovascular:     Rate and Rhythm: Normal rate and regular rhythm.     Pulses: Normal pulses.     Heart sounds: Normal heart sounds. No murmur heard.   No friction rub. No gallop.  Pulmonary:     Effort: Pulmonary effort is normal.     Breath sounds: Normal breath sounds. No stridor. No wheezing, rhonchi or rales.  Musculoskeletal:     Cervical back: Normal range of motion and neck supple. No rigidity or tenderness.  Lymphadenopathy:     Cervical: No cervical adenopathy.  Skin:    General: Skin is warm and dry.     Capillary Refill: Capillary refill takes less than 2 seconds.     Coloration: Skin is not jaundiced.     Findings: No erythema or rash.  Neurological:     General: No focal deficit present.     Mental Status: She is alert and oriented to person, place, and time.     UC Treatments / Results  Labs (all labs ordered are listed, but only abnormal results are displayed) Labs Reviewed - No data to display  EKG   Radiology No results found.  Procedures Procedures (including critical care time)  Medications Ordered in UC Medications - No data to display  Initial Impression / Assessment  and Plan / UC Course  I have reviewed the triage vital signs and the nursing notes.  Pertinent labs & imaging results that were available during my care of the patient were reviewed by me and considered in my medical decision making (see chart for details).  Clinical impression: 1.  Maxillary sinusitis with a chronicity component to it.  Followed by ENT. 2.  Cough 3.  Nasal congestion 4.  Postnasal  drip 5.  History of allergies to multiple medication 6.  History of allergies and seen by seasonal allergist.  Treatment plan: 1.  The findings and treatment plan were discussed in detail with the patient.  Patient was in agreement. 2.  Given her multiple medication allergies I will go ahead and treat her with a Z-Pak.  She assured me that she is taken this in the past and did not have a reaction. 3.  Continue with her allergy medications including Singulair and Flonase. 4.  Educational handouts provided. 5.  Over-the-counter cough medicine such as Delsym or Robitussin. 6.  Plenty of rest, plenty fluids, Tylenol or Motrin for any fever or discomfort. 7.  Provider work note saying she was seen today but I see no reason why she cannot go back to work today. 8.  If symptoms were to worsen then she knows she should go to the ER. 9.  She was discharged in stable condition and will follow-up here as needed.    Final Clinical Impressions(s) / UC Diagnoses   Final diagnoses:  Chronic maxillary sinusitis  Nasal congestion  Postnasal drip  Cough     Discharge Instructions      As we discussed, I will treat you for a sinus infection.  Given your multiple medication allergies, we decided that a Z-Pak would be a good option for you.  You said you have taken that in the past and that you did not have any reaction to it. Please see educational handouts. I want to continue with your Singulair and Flonase. I am hesitant to give you a prescription strength cough medicine.  I do not want you to develop a pneumonia by suppressing your cough.  Also, given your multiple medical allergies I do want to prescribe something that may cause you to have a reaction.  I encourage you to get over-the-counter cough medicine.  Delsym or Robitussin without the DM component would be a good choice.  I also encourage you to get some Mucinex over-the-counter, again without the DM component.  That will help thin secretions  and will help your cough as well. If your symptoms persist please see your ENT or allergist. If they worsen then please go to the ER.     ED Prescriptions     Medication Sig Dispense Auth. Provider   azithromycin (ZITHROMAX) 250 MG tablet Take 1 tablet (250 mg total) by mouth daily. Take first 2 tablets together, then 1 every day until finished. 6 tablet Verda Cumins, MD      PDMP not reviewed this encounter.   Verda Cumins, MD 12/19/20 1213

## 2020-12-19 NOTE — Discharge Instructions (Addendum)
As we discussed, I will treat you for a sinus infection.  Given your multiple medication allergies, we decided that a Z-Pak would be a good option for you.  You said you have taken that in the past and that you did not have any reaction to it. Please see educational handouts. I want to continue with your Singulair and Flonase. I am hesitant to give you a prescription strength cough medicine.  I do not want you to develop a pneumonia by suppressing your cough.  Also, given your multiple medical allergies I do want to prescribe something that may cause you to have a reaction.  I encourage you to get over-the-counter cough medicine.  Delsym or Robitussin without the DM component would be a good choice.  I also encourage you to get some Mucinex over-the-counter, again without the DM component.  That will help thin secretions and will help your cough as well. If your symptoms persist please see your ENT or allergist. If they worsen then please go to the ER.

## 2021-04-08 ENCOUNTER — Encounter: Payer: Self-pay | Admitting: Emergency Medicine

## 2021-04-08 ENCOUNTER — Other Ambulatory Visit: Payer: Self-pay

## 2021-04-08 ENCOUNTER — Ambulatory Visit
Admission: EM | Admit: 2021-04-08 | Discharge: 2021-04-08 | Disposition: A | Discharge status: Home / Self Care | Payer: BC Managed Care – PPO | Attending: Internal Medicine | Admitting: Internal Medicine

## 2021-04-08 DIAGNOSIS — Z20828 Contact with and (suspected) exposure to other viral communicable diseases: Secondary | ICD-10-CM | POA: Diagnosis not present

## 2021-04-08 DIAGNOSIS — J069 Acute upper respiratory infection, unspecified: Secondary | ICD-10-CM

## 2021-04-08 MED ORDER — OSELTAMIVIR PHOSPHATE 75 MG PO CAPS
75.0000 mg | ORAL_CAPSULE | Freq: Two times a day (BID) | ORAL | 0 refills | Status: DC
Start: 2021-04-08 — End: 2021-09-04

## 2021-04-08 MED ORDER — BENZONATATE 200 MG PO CAPS: 200.0000 mg | ORAL_CAPSULE | Freq: Three times a day (TID) | ORAL | 0 refills | Status: DC | PRN

## 2021-04-08 NOTE — ED Provider Notes (Signed)
[facility]    CSN: [id number] Arrival date & time: 04/08/21  7673      History   Chief Complaint Chief Complaint  Patient presents with   Cough    HPI Sharon [name] is a 60 y.o. female who presents with onset of cough, neck nose congestion and ST that started 2 nights ago. She denies having a fever. Has been around several people with Influenza A and she took care of her grandson this weekend who developed a fever and was on preventive Tamiflu since his parents had the flu.    Past Medical History:  Diagnosis Date   Acid reflux    H/O PRIOR TO GASTRC BYPASS   Anemia    H/O   Anxiety    Arthritis    Bladder spasm    Depression    Headache    H/O MIGRAINES   History of kidney stones    Left ureteral stone    Leg DVT (deep venous thromboembolism), acute ([place])    x 2-both times after a surgery-went to see hematologist 2017 (Dr Grayland Ormond)  and no clotting disorder ever found   Restless leg syndrome    Sepsis ([place])    FROM KIDNEY STONES   Sleep apnea    HAD GASTRIC BYPASS AND DOES NOT HAVE SLEEP APNEA   Urge incontinence     Patient Active Problem List   Diagnosis Date Noted   Anxiety 03/30/2019   Depression 03/30/2019   Migraine 03/30/2019   Marginal ulcer 03/24/2017   S/P gastric bypass 03/24/2017   Sepsis ([place]) 11/15/2016   Hydronephrosis 11/15/2016   GERD (gastroesophageal reflux disease) 11/15/2016   UTI (urinary tract infection) 11/15/2016   Right leg DVT ([place]) 01/24/2016   Acute deep vein thrombosis (DVT) of popliteal vein of right lower extremity ([place]) 01/04/2016   Incomplete tear of right rotator cuff 11/22/2015   Moderate obstructive sleep apnea 09/21/2015   Kidney stones 09/10/2015   Urge incontinence 09/10/2015   Nocturia 09/10/2015   Joint pain 09/05/2015   S/P laparoscopic cholecystectomy 09/05/2015   Paralabral cyst of shoulder, right, subsequent encounter 01/10/2015   Rotator cuff tendinitis, right 01/10/2015   Tear of  right glenoid labrum 01/10/2015   Acute ankle pain 11/16/2013   Acute shoulder pain 11/16/2013    Past Surgical History:  Procedure Laterality Date   ABDOMINAL HYSTERECTOMY     TOTAL ABDOMINAL HYSTERECTOMY W/ BILATERAL SALPINGOOPHORECTOMY   BARIATRIC SURGERY  05/15/2016   CHOLECYSTECTOMY     ESOPHAGOGASTRODUODENOSCOPY  05/15/2016   KIDNEY STONE SURGERY     KNEE ARTHROSCOPY Right    X 2   KNEE ARTHROSCOPY Right 11/11/2018   Procedure: Partial right knee arthroscopy,arthroscopic medial and lateral menisectomy, plica excision;  Surgeon: Hessie Knows, MD;  Location: [facility];  Service: Orthopedics;  Laterality: Right;   KNEE SURGERY Left 2006   SHOULDER ARTHROSCOPY WITH OPEN ROTATOR CUFF REPAIR Right 11/22/2015   Procedure: SHOULDER ARTHROSCOPY, DEBRIDEMENT, DECOMPRESSION, SLAP REPAIR, POSSIBLE BICEP TENODESIS;  Surgeon: Corky Mull, MD;  Location: [facility];  Service: Orthopedics;  Laterality: Right;   SINUS EXPLORATION      OB History   No obstetric history on file.      Home Medications    Prior to Admission medications   Medication Sig Start Date End Date Taking? Authorizing Provider  levocetirizine (XYZAL) 5 MG tablet Take 5 mg by mouth every evening.   Yes [provider]  montelukast (SINGULAIR) 10 MG tablet Take 10 mg  by mouth at bedtime.   Yes [provider]  azithromycin (ZITHROMAX) 250 MG tablet Take 1 tablet (250 mg total) by mouth daily. Take first 2 tablets together, then 1 every day until finished. 12/19/20   Verda Cumins, MD  benzonatate (TESSALON) 200 MG capsule Take 1 capsule (200 mg total) by mouth 3 (three) times daily as needed for cough. Patient not taking: Reported on 08/17/2020 05/23/20   Verda Cumins, MD  fluticasone [facility]) 50 MCG/ACT nasal spray Place 2 sprays into both nostrils daily as needed for allergies.  Patient not taking: Reported on 08/17/2020 07/05/16   [provider]  ipratropium (ATROVENT) 0.06 % nasal spray Place 2  sprays into both nostrils 4 (four) times daily as needed for rhinitis. Patient not taking: Reported on 08/17/2020 04/17/20   Coral Spikes, DO  Multiple Vitamin (MULTIVITAMIN) tablet Take 1 tablet by mouth daily.    [provider]  apixaban (ELIQUIS) 5 MG TABS tablet Take 1 tablet (5 mg total) by mouth 2 (two) times daily. 11/11/18 04/15/19  Hessie Knows, MD    Family History Family History  Problem Relation Age of Onset   Cancer Mother        rectal   Lung cancer Father    Hematuria Brother    Kidney disease Paternal Grandfather    Kidney Stones Brother    Bladder Cancer Neg Hx    Kidney cancer Neg Hx     Social History Social History   Tobacco Use   Smoking status: Never   Smokeless tobacco: Never  Vaping Use   Vaping Use: Never used  Substance Use Topics   Alcohol use: No    Alcohol/week: 0.0 standard drinks   Drug use: No     Allergies   Alcohol, Bacitracin-neomycin-polymyxin, Codeine, Hydrocodone, Hydrocortisone, Lanolin, Other, Tape, Tapentadol, Tramadol, Bacitracin, Clarithromycin, Latex, Nickel, Oxycodone, Penicillins, and Sulfamethoxazole-trimethoprim   Review of Systems Review of Systems  Constitutional:  Negative for appetite change, fatigue and fever.  HENT:  Positive for congestion, postnasal drip, rhinorrhea and sore throat. Negative for ear discharge and ear pain.   Eyes:  Negative for discharge.  Respiratory:  Positive for cough.   Musculoskeletal:  Negative for gait problem, myalgias and neck stiffness.  Skin:  Negative for rash.  Neurological:  Positive for headaches.    Physical Exam Triage Vital Signs ED Triage Vitals  Enc Vitals Group     BP 04/08/21 0853 126/83     Pulse Rate 04/08/21 0853 85     Resp 04/08/21 0853 18     Temp 04/08/21 0853 98.4 F (36.9 C)     Temp Source 04/08/21 0853 Oral     SpO2 04/08/21 0853 97 %     Weight 04/08/21 0851 205 lb (93 kg)     Height 04/08/21 0851 5\' 6"  (1.676 m)     Head Circumference --       Peak Flow --      Pain Score 04/08/21 0851 4     Pain Loc --      Pain Edu? --      Excl. in [place]? --    No data found.  Updated Vital Signs BP 126/83 (BP Location: Left Arm)   Pulse 85   Temp 98.4 F (36.9 C) (Oral)   Resp 18   Ht 5\' 6"  (1.676 m)   Wt 205 lb (93 kg)   SpO2 97%   BMI 33.09 kg/m   Visual Acuity Right Eye Distance:  Left Eye Distance:   Bilateral Distance:    Right Eye Near:   Left Eye Near:    Bilateral Near:     Physical Exam Physical Exam Vitals signs and nursing note reviewed.  Constitutional:      General: She is not in acute distress.    Appearance: Normal appearance. She is not ill-appearing, toxic-appearing or diaphoretic.  HENT:     Head: Normocephalic.     Right Ear: Tympanic membrane, ear canal and external ear normal.     Left Ear: Tympanic membrane, ear canal and external ear normal.     Nose:clear rhinitis    Mouth/Throat:     Mouth: Mucous membranes are moist.  Eyes:     General: No scleral icterus.       Right eye: No discharge.        Left eye: No discharge.     Conjunctiva/sclera: Conjunctivae normal.  Neck:     Musculoskeletal: Neck supple. No neck rigidity.  Cardiovascular:     Rate and Rhythm: Normal rate and regular rhythm.     Heart sounds: No murmur.  Pulmonary:     Effort: Pulmonary effort is normal.     Breath sounds: Normal breath sounds.   Musculoskeletal: Normal range of motion.  Lymphadenopathy:     Cervical: No cervical adenopathy.  Skin:    General: Skin is warm and dry.     Coloration: Skin is not jaundiced.     Findings: No rash.  Neurological:     Mental Status: She is alert and oriented to person, place, and time.     Gait: Gait normal.  Psychiatric:        Mood and Affect: Mood normal.        Behavior: Behavior normal.        Thought Content: Thought content normal.        Judgment: Judgment normal.    UC Treatments / Results  Labs (all labs ordered are listed, but only abnormal results  are displayed) Labs Reviewed - No data to display  EKG   Radiology No results found.  Procedures Procedures (including critical care time)  Medications Ordered in UC Medications - No data to display  Initial Impression / Assessment and Plan / UC Course  I have reviewed the triage vital signs and the nursing notes. Influenza exposure and possibly early Flu I placed her on Tamiflu and Tessalon Final Clinical Impressions(s) / UC Diagnoses   Final diagnoses:  None   Discharge Instructions   None    ED Prescriptions   None    PDMP not reviewed this encounter.   Shelby Mattocks, [place] 04/08/21 [phone]

## 2021-04-08 NOTE — ED Triage Notes (Addendum)
Pt c/o cough, body aches, nasal congestion, and sore throat. Started about 3 days ago. Denies fever. She has been around several people with flu. Declines covid testing at this time.

## 2021-04-14 ENCOUNTER — Ambulatory Visit (INDEPENDENT_AMBULATORY_CARE_PROVIDER_SITE_OTHER): Payer: BC Managed Care – PPO

## 2021-04-14 ENCOUNTER — Other Ambulatory Visit: Payer: Self-pay

## 2021-04-14 ENCOUNTER — Ambulatory Visit
Admission: EM | Admit: 2021-04-14 | Discharge: 2021-04-14 | Disposition: A | Discharge status: Home / Self Care | Payer: BC Managed Care – PPO | Attending: Physician Assistant | Admitting: Physician Assistant

## 2021-04-14 ENCOUNTER — Encounter: Payer: Self-pay | Admitting: Emergency Medicine

## 2021-04-14 DIAGNOSIS — R051 Acute cough: Secondary | ICD-10-CM | POA: Diagnosis not present

## 2021-04-14 DIAGNOSIS — R0981 Nasal congestion: Secondary | ICD-10-CM | POA: Diagnosis not present

## 2021-04-14 DIAGNOSIS — R059 Cough, unspecified: Secondary | ICD-10-CM

## 2021-04-14 DIAGNOSIS — J209 Acute bronchitis, unspecified: Secondary | ICD-10-CM

## 2021-04-14 MED ORDER — PSEUDOEPH-BROMPHEN-DM 30-2-10 MG/5ML PO SYRP: 10.0000 mL | ORAL_SOLUTION | Freq: Four times a day (QID) | ORAL | 0 refills | Status: AC | PRN

## 2021-04-14 MED ORDER — PREDNISONE 20 MG PO TABS: 40.0000 mg | ORAL_TABLET | Freq: Every day | ORAL | 0 refills | Status: AC

## 2021-04-14 NOTE — ED Triage Notes (Signed)
Patient states that she was seen on Monday and treated for the flu.  Patient reports ongoing cough and green sputum.  Patient reports fevers.

## 2021-04-14 NOTE — Discharge Instructions (Signed)
-  Your chest x-ray is normal.  As we discussed your symptoms are consistent with influenza given your multiple exposures.  Is a good sign that you are fever has resolved and your chest x-ray is normal. - As we also discussed your symptoms are consistent with viral bronchitis likely due to the flu.  Symptoms can last for couple of weeks sometimes but you should be feeling better over the next week especially after you start the prednisone.  Have also sent a cough medicine for you.  Make sure to increase rest and fluids. - Follow-up with your PCP if not feeling better in the next week.  Return here if you develop a fever or breathing problem.

## 2021-04-14 NOTE — ED Provider Notes (Signed)
[facility]    CSN: [id number] Arrival date & time: 04/14/21  1147      History   Chief Complaint Chief Complaint  Patient presents with   Cough    HPI Brynnley A Binning is a 60 y.o. female presenting for approximately 8-day history of fatigue, cough and congestion.  Patient was seen 6 days ago and treated for suspected influenza given her close exposure to contacts who have had the flu.  She reported temps up to 102 to 103 degrees at that time.  Patient says she had fever for about 3 days and then it resolved.  Has not had a fever in the past couple of days.  She says her nasal congestion and cough seem to have gotten worse.  Reports yellowish-green discharge from nose and says that her sputum is also discolored.  Reports pain in her chest when she coughs.  No shortness of breath.  No vomiting or diarrhea.  Patient reports taking all of the Tamiflu that she was prescribed and also the benzonatate.  Patient concerned because her symptoms seem to be getting worse and not better.  Concerned about the possibility of pneumonia.  No other complaints.  HPI  Past Medical History:  Diagnosis Date   Acid reflux    H/O PRIOR TO GASTRC BYPASS   Anemia    H/O   Anxiety    Arthritis    Bladder spasm    Depression    Headache    H/O MIGRAINES   History of kidney stones    Left ureteral stone    Leg DVT (deep venous thromboembolism), acute ([place])    x 2-both times after a surgery-went to see hematologist 2017 (Dr Grayland Ormond)  and no clotting disorder ever found   Restless leg syndrome    Sepsis ([place])    FROM KIDNEY STONES   Sleep apnea    HAD GASTRIC BYPASS AND DOES NOT HAVE SLEEP APNEA   Urge incontinence     Patient Active Problem List   Diagnosis Date Noted   Anxiety 03/30/2019   Depression 03/30/2019   Migraine 03/30/2019   Marginal ulcer 03/24/2017   S/P gastric bypass 03/24/2017   Sepsis ([place]) 11/15/2016   Hydronephrosis 11/15/2016   GERD (gastroesophageal  reflux disease) 11/15/2016   UTI (urinary tract infection) 11/15/2016   Right leg DVT ([place]) 01/24/2016   Acute deep vein thrombosis (DVT) of popliteal vein of right lower extremity ([place]) 01/04/2016   Incomplete tear of right rotator cuff 11/22/2015   Moderate obstructive sleep apnea 09/21/2015   Kidney stones 09/10/2015   Urge incontinence 09/10/2015   Nocturia 09/10/2015   Joint pain 09/05/2015   S/P laparoscopic cholecystectomy 09/05/2015   Paralabral cyst of shoulder, right, subsequent encounter 01/10/2015   Rotator cuff tendinitis, right 01/10/2015   Tear of right glenoid labrum 01/10/2015   Acute ankle pain 11/16/2013   Acute shoulder pain 11/16/2013    Past Surgical History:  Procedure Laterality Date   ABDOMINAL HYSTERECTOMY     TOTAL ABDOMINAL HYSTERECTOMY W/ BILATERAL SALPINGOOPHORECTOMY   BARIATRIC SURGERY  05/15/2016   CHOLECYSTECTOMY     ESOPHAGOGASTRODUODENOSCOPY  05/15/2016   KIDNEY STONE SURGERY     KNEE ARTHROSCOPY Right    X 2   KNEE ARTHROSCOPY Right 11/11/2018   Procedure: Partial right knee arthroscopy,arthroscopic medial and lateral menisectomy, plica excision;  Surgeon: Hessie Knows, MD;  Location: [facility];  Service: Orthopedics;  Laterality: Right;   KNEE SURGERY Left 2006   SHOULDER ARTHROSCOPY  WITH OPEN ROTATOR CUFF REPAIR Right 11/22/2015   Procedure: SHOULDER ARTHROSCOPY, DEBRIDEMENT, DECOMPRESSION, SLAP REPAIR, POSSIBLE BICEP TENODESIS;  Surgeon: Corky Mull, MD;  Location: [facility];  Service: Orthopedics;  Laterality: Right;   SINUS EXPLORATION      OB History   No obstetric history on file.      Home Medications    Prior to Admission medications   Medication Sig Start Date End Date Taking? Authorizing Provider  benzonatate (TESSALON) 200 MG capsule Take 1 capsule (200 mg total) by mouth 3 (three) times daily as needed for cough. 04/08/21  Yes [place], Sunday Spillers, PA-C  levocetirizine (XYZAL) 5 MG tablet Take 5 mg by mouth every  evening.   Yes [provider]  montelukast (SINGULAIR) 10 MG tablet Take 10 mg by mouth at bedtime.   Yes [provider]  oseltamivir (TAMIFLU) 75 MG capsule Take 1 capsule (75 mg total) by mouth every 12 (twelve) hours. 04/08/21  Yes Rodriguez-Southworth, Sunday Spillers, PA-C  brompheniramine-pseudoephedrine-DM 30-2-10 MG/5ML syrup Take 10 mLs by mouth 4 (four) times daily as needed for up to 7 days. 04/14/21 04/21/21 Yes Danton Clap, PA-C  predniSONE (DELTASONE) 20 MG tablet Take 2 tablets (40 mg total) by mouth daily for 5 days. 04/14/21 04/19/21 Yes Danton Clap, PA-C  apixaban (ELIQUIS) 5 MG TABS tablet Take 1 tablet (5 mg total) by mouth 2 (two) times daily. 11/11/18 04/15/19  Hessie Knows, MD    Family History Family History  Problem Relation Age of Onset   Cancer Mother        rectal   Lung cancer Father    Hematuria Brother    Kidney disease Paternal Grandfather    Kidney Stones Brother    Bladder Cancer Neg Hx    Kidney cancer Neg Hx     Social History Social History   Tobacco Use   Smoking status: Never   Smokeless tobacco: Never  Vaping Use   Vaping Use: Never used  Substance Use Topics   Alcohol use: No    Alcohol/week: 0.0 standard drinks   Drug use: No     Allergies   Alcohol, Bacitracin-neomycin-polymyxin, Codeine, Hydrocodone, Hydrocortisone, Lanolin, Other, Tape, Tapentadol, Tramadol, Bacitracin, Clarithromycin, Latex, Nickel, Oxycodone, Penicillins, and Sulfamethoxazole-trimethoprim   Review of Systems Review of Systems  Constitutional:  Positive for fatigue and fever (resolved). Negative for chills and diaphoresis.  HENT:  Positive for congestion, ear pain, rhinorrhea, sinus pressure and sore throat. Negative for sinus pain.   Respiratory:  Positive for cough. Negative for shortness of breath.   Cardiovascular:  Positive for chest pain (sometimes when coughing).  Gastrointestinal:  Negative for abdominal pain, nausea and vomiting.   Musculoskeletal:  Positive for myalgias. Negative for arthralgias.  Skin:  Negative for rash.  Neurological:  Positive for headaches. Negative for weakness.  Hematological:  Negative for adenopathy.    Physical Exam Triage Vital Signs ED Triage Vitals  Enc Vitals Group     BP 04/14/21 1231 126/79     Pulse Rate 04/14/21 1231 76     Resp 04/14/21 1231 14     Temp 04/14/21 1231 98.3 F (36.8 C)     Temp Source 04/14/21 1231 Oral     SpO2 04/14/21 1231 99 %     Weight 04/14/21 1230 204 lb (92.5 kg)     Height 04/14/21 1230 5\' 6"  (1.676 m)     Head Circumference --      Peak Flow --  Pain Score 04/14/21 1230 6     Pain Loc --      Pain Edu? --      Excl. in [place]? --    No data found.  Updated Vital Signs BP 126/79 (BP Location: Left Arm)   Pulse 76   Temp 98.3 F (36.8 C) (Oral)   Resp 14   Ht 5\' 6"  (1.676 m)   Wt 204 lb (92.5 kg)   SpO2 99%   BMI 32.93 kg/m      Physical Exam Vitals and nursing note reviewed.  Constitutional:      General: She is not in acute distress.    Appearance: Normal appearance. She is ill-appearing. She is not toxic-appearing.  HENT:     Head: Normocephalic and atraumatic.     Right Ear: Tympanic membrane, ear canal and external ear normal.     Left Ear: Tympanic membrane, ear canal and external ear normal.     Nose: Congestion present.     Mouth/Throat:     Mouth: Mucous membranes are moist.     Pharynx: Oropharynx is clear. Posterior oropharyngeal erythema present.  Eyes:     General: No scleral icterus.       Right eye: No discharge.        Left eye: No discharge.     Conjunctiva/sclera: Conjunctivae normal.  Cardiovascular:     Rate and Rhythm: Normal rate and regular rhythm.     Heart sounds: Normal heart sounds.  Pulmonary:     Effort: Pulmonary effort is normal. No respiratory distress.     Breath sounds: Normal breath sounds. No wheezing, rhonchi or rales.  Musculoskeletal:     Cervical back: Neck supple.  Skin:     General: Skin is dry.  Neurological:     General: No focal deficit present.     Mental Status: She is alert. Mental status is at baseline.     Motor: No weakness.     Gait: Gait normal.  Psychiatric:        Mood and Affect: Mood normal.        Behavior: Behavior normal.        Thought Content: Thought content normal.     UC Treatments / Results  Labs (all labs ordered are listed, but only abnormal results are displayed) Labs Reviewed - No data to display  EKG   Radiology DG Chest 2 View  Result Date: 04/14/2021 CLINICAL DATA:  Productive cough for 8 days. EXAM: CHEST - 2 VIEW COMPARISON:  09/30/2017 FINDINGS: The heart size and mediastinal contours are within normal limits. Both lungs are clear. The visualized skeletal structures are unremarkable. IMPRESSION: No active cardiopulmonary disease. Electronically Signed   By: Marlaine Hind M.D.   On: 04/14/2021 14:27    Procedures Procedures (including critical care time)  Medications Ordered in UC Medications - No data to display  Initial Impression / Assessment and Plan / UC Course  I have reviewed the triage vital signs and the nursing notes.  Pertinent labs & imaging results that were available during my care of the patient were reviewed by me and considered in my medical decision making (see chart for details).  60 year old female presenting for 8-day history of cough and congestion.  Patient was seen 6 days ago by a colleague and diagnosed with influenza based on her close exposure and her reports of associated fever, body aches, fatigue and cough with congestion.  Patient was prescribed Tamiflu and says she has taken  it all.  Has also taken benzonatate but says her cough continues to be productive of yellowish-green sputum and so does her nasal drainage.  No continued fevers though, those resolved a couple days ago.  Patient is mildly ill-appearing but nontoxic.  Vitals are all stable.  Nasal congestion on exam as well as  posterior pharyngeal erythema chest clear to auscultation.  Chest x-ray ordered to assess for possible pneumonia especially given patient's concerns.  Imaging reviewed by me.  No evidence of pneumonia.  Discussed this with patient.  Advised her that her symptoms are consistent with acute bronchitis.  Explained that greater than 90% of the time this is due to a virus and hers is likely due to the flu.  I have sent prednisone and Bromfed-DM to pharmacy.  Encouraged increase rest and fluids.  Advised following up with PCP if not feeling better over the next week to consider antibiotics but not indicated at this time.  ED precautions reviewed.   Final Clinical Impressions(s) / UC Diagnoses   Final diagnoses:  Acute bronchitis, unspecified organism  Acute cough  Nasal congestion     Discharge Instructions      -Your chest x-ray is normal.  As we discussed your symptoms are consistent with influenza given your multiple exposures.  Is a good sign that you are fever has resolved and your chest x-ray is normal. - As we also discussed your symptoms are consistent with viral bronchitis likely due to the flu.  Symptoms can last for couple of weeks sometimes but you should be feeling better over the next week especially after you start the prednisone.  Have also sent a cough medicine for you.  Make sure to increase rest and fluids. - Follow-up with your PCP if not feeling better in the next week.  Return here if you develop a fever or breathing problem.     ED Prescriptions     Medication Sig Dispense Auth. Provider   predniSONE (DELTASONE) 20 MG tablet Take 2 tablets (40 mg total) by mouth daily for 5 days. 10 tablet Laurene Footman B, PA-C   brompheniramine-pseudoephedrine-DM 30-2-10 MG/5ML syrup Take 10 mLs by mouth 4 (four) times daily as needed for up to 7 days. 150 mL Danton Clap, PA-C      PDMP not reviewed this encounter.   Danton Clap, PA-C 04/14/21 1455

## 2021-07-01 DIAGNOSIS — M2142 Flat foot [pes planus] (acquired), left foot: Secondary | ICD-10-CM | POA: Insufficient documentation

## 2021-07-01 DIAGNOSIS — M79672 Pain in left foot: Secondary | ICD-10-CM | POA: Insufficient documentation

## 2021-07-01 DIAGNOSIS — M7752 Other enthesopathy of left foot: Secondary | ICD-10-CM | POA: Insufficient documentation

## 2021-07-02 ENCOUNTER — Other Ambulatory Visit: Payer: Self-pay | Admitting: Physician Assistant

## 2021-07-02 DIAGNOSIS — M79672 Pain in left foot: Secondary | ICD-10-CM

## 2021-07-04 DIAGNOSIS — J3081 Allergic rhinitis due to animal (cat) (dog) hair and dander: Secondary | ICD-10-CM | POA: Insufficient documentation

## 2021-07-04 DIAGNOSIS — L509 Urticaria, unspecified: Secondary | ICD-10-CM | POA: Insufficient documentation

## 2021-07-04 DIAGNOSIS — J309 Allergic rhinitis, unspecified: Secondary | ICD-10-CM | POA: Insufficient documentation

## 2021-07-04 DIAGNOSIS — J301 Allergic rhinitis due to pollen: Secondary | ICD-10-CM | POA: Insufficient documentation

## 2021-07-04 DIAGNOSIS — H1045 Other chronic allergic conjunctivitis: Secondary | ICD-10-CM | POA: Insufficient documentation

## 2021-08-28 ENCOUNTER — Ambulatory Visit
Admission: EM | Admit: 2021-08-28 | Discharge: 2021-08-28 | Disposition: A | Discharge status: Home / Self Care | Payer: BC Managed Care – PPO

## 2021-08-28 DIAGNOSIS — H109 Unspecified conjunctivitis: Secondary | ICD-10-CM

## 2021-08-28 DIAGNOSIS — B029 Zoster without complications: Secondary | ICD-10-CM

## 2021-08-28 DIAGNOSIS — B9689 Other specified bacterial agents as the cause of diseases classified elsewhere: Secondary | ICD-10-CM

## 2021-08-28 MED ORDER — MOXIFLOXACIN HCL 0.5 % OP SOLN: 1.0000 [drp] | Freq: Three times a day (TID) | OPHTHALMIC | 0 refills | Status: DC

## 2021-08-28 MED ORDER — VALACYCLOVIR HCL 1 G PO TABS: 1000.0000 mg | ORAL_TABLET | Freq: Three times a day (TID) | ORAL | 0 refills | Status: AC

## 2021-08-28 NOTE — ED Provider Notes (Signed)
[facility]    CSN: [id number] Arrival date & time: 08/28/21  0801      History   Chief Complaint Chief Complaint  Patient presents with   Rash   Side Pain (Upper Right)    HPI Sharon [name] is a 61 y.o. female.   Patient presents with a burning sensation to the right flank for 2 days with associated chills and fatigue.  Denies urinary symptoms, vaginal symptoms, nausea, vomiting, diarrhea, abdominal pain.  Has not attempted treatment of symptoms.  Unvaccinated for shingles.  No known exposure.  Denies precipitating event or injury.  Concern with right eye erythema and drainage and left eye itching for 3 days.  Has begun to use moxifloxacin eyedrops which have been effective in minimizing symptoms.  Denies blurred vision, light sensitivity, eye pain.    Past Medical History:  Diagnosis Date   Acid reflux    H/O PRIOR TO GASTRC BYPASS   Anemia    H/O   Anxiety    Arthritis    Bladder spasm    Depression    Headache    H/O MIGRAINES   History of kidney stones    Left ureteral stone    Leg DVT (deep venous thromboembolism), acute (HCC)    x 2-both times after a surgery-went to see hematologist 2017 (Dr Orlie Dakin)  and no clotting disorder ever found   Restless leg syndrome    Sepsis (HCC)    FROM KIDNEY STONES   Sleep apnea    HAD GASTRIC BYPASS AND DOES NOT HAVE SLEEP APNEA   Urge incontinence     Patient Active Problem List   Diagnosis Date Noted   Allergic rhinitis 07/04/2021   Allergic rhinitis due to animal (cat) (dog) hair and dander 07/04/2021   Allergic rhinitis due to pollen 07/04/2021   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/2021   Urticaria 07/04/2021   Acquired pes planus of left foot 07/01/2021   Pain in left foot 07/01/2021   Tendinitis of left foot 07/01/2021   Anxiety 03/30/2019   Depression 03/30/2019   Migraine 03/30/2019   Marginal ulcer 03/24/2017   S/P gastric bypass 03/24/2017   Sepsis (HCC) 11/15/2016    Hydronephrosis 11/15/2016   GERD (gastroesophageal reflux disease) 11/15/2016   UTI (urinary tract infection) 11/15/2016   Right leg DVT (HCC) 01/24/2016   Acute deep vein thrombosis (DVT) of popliteal vein of right lower extremity (HCC) 01/04/2016   Incomplete tear of right rotator cuff 11/22/2015   Moderate obstructive sleep apnea 09/21/2015   Kidney stones 09/10/2015   Urge incontinence 09/10/2015   Nocturia 09/10/2015   Joint pain 09/05/2015   S/P laparoscopic cholecystectomy 09/05/2015   Paralabral cyst of shoulder, right, subsequent encounter 01/10/2015   Rotator cuff tendinitis, right 01/10/2015   Tear of right glenoid labrum 01/10/2015   Acute ankle pain 11/16/2013   Acute shoulder pain 11/16/2013    Past Surgical History:  Procedure Laterality Date   ABDOMINAL HYSTERECTOMY     TOTAL ABDOMINAL HYSTERECTOMY W/ BILATERAL SALPINGOOPHORECTOMY   BARIATRIC SURGERY  05/15/2016   CHOLECYSTECTOMY     ESOPHAGOGASTRODUODENOSCOPY  05/15/2016   KIDNEY STONE SURGERY     KNEE ARTHROSCOPY Right    X 2   KNEE ARTHROSCOPY Right 11/11/2018   Procedure: Partial right knee arthroscopy,arthroscopic medial and lateral menisectomy, plica excision;  Surgeon: Kennedy Bucker, MD;  Location: [facility];  Service: Orthopedics;  Laterality: Right;   KNEE SURGERY Left 2006   SHOULDER ARTHROSCOPY WITH OPEN ROTATOR CUFF REPAIR  Right 11/22/2015   Procedure: SHOULDER ARTHROSCOPY, DEBRIDEMENT, DECOMPRESSION, SLAP REPAIR, POSSIBLE BICEP TENODESIS;  Surgeon: Christena Flake, MD;  Location: [facility];  Service: Orthopedics;  Laterality: Right;   SINUS EXPLORATION      OB History   No obstetric history on file.      Home Medications    Prior to Admission medications   Medication Sig Start Date End Date Taking? Authorizing Provider  doxylamine, Sleep, (UNISOM) 25 MG tablet Take 25 mg by mouth at bedtime as needed.   Yes [provider]  levocetirizine (XYZAL) 5 MG tablet Take 1 tablet by mouth every  evening.   Yes [provider]  montelukast (SINGULAIR) 10 MG tablet Take 10 mg by mouth at bedtime.   Yes [provider]  triamcinolone (NASACORT) 55 MCG/ACT AERO nasal inhaler 1 spray in each nostril   Yes [provider]  benzonatate (TESSALON) 200 MG capsule Take 1 capsule (200 mg total) by mouth 3 (three) times daily as needed for cough. 04/08/21   Rodriguez-Southworth, Nettie Elm, PA-C  mometasone (ELOCON) 0.1 % cream 1 application to affected area    [provider]  Olopatadine HCl (PATADAY OP) 1 drop into affected eye    [provider]  oseltamivir (TAMIFLU) 75 MG capsule Take 1 capsule (75 mg total) by mouth every 12 (twelve) hours. 04/08/21   Rodriguez-Southworth, Nettie Elm, PA-C  apixaban (ELIQUIS) 5 MG TABS tablet Take 1 tablet (5 mg total) by mouth 2 (two) times daily. 11/11/18 04/15/19  Kennedy Bucker, MD    Family History Family History  Problem Relation Age of Onset   Cancer Mother        rectal   Lung cancer Father    Hematuria Brother    Kidney disease Paternal Grandfather    Kidney Stones Brother    Bladder Cancer Neg Hx    Kidney cancer Neg Hx     Social History Social History   Tobacco Use   Smoking status: Never   Smokeless tobacco: Never  Vaping Use   Vaping Use: Never used  Substance Use Topics   Alcohol use: No    Alcohol/week: 0.0 standard drinks   Drug use: No     Allergies   Alcohol, Bacitracin-neomycin-polymyxin, Bacitracin-polymyxin b, Codeine, Hydrocodone, Hydrocortisone, Lanolin, Neomycin, Norepinephrine bitartrate, Other, Oxycodone-acetaminophen, Penicillin g sodium, Tape, Tapentadol, Tramadol, Tramadol hcl, Trimethoprim, Wound dressings, Bacitracin, Clarithromycin, Latex, Nickel, Oxycodone, Penicillins, and Sulfamethoxazole-trimethoprim   Review of Systems Review of Systems Defer to HPI    Physical Exam Triage Vital Signs ED Triage Vitals  Enc Vitals Group     BP 08/28/21 0814 135/68     Pulse  Rate 08/28/21 0814 72     Resp 08/28/21 0814 18     Temp 08/28/21 0814 98.1 F (36.7 C)     Temp Source 08/28/21 0814 Oral     SpO2 08/28/21 0814 99 %     Weight 08/28/21 0811 208 lb (94.3 kg)     Height --      Head Circumference --      Peak Flow --      Pain Score 08/28/21 0810 5     Pain Loc --      Pain Edu? --      Excl. in GC? --    No data found.  Updated Vital Signs BP 135/68 (BP Location: Left Arm) Comment: Over sleeve, per request  Pulse 72   Temp 98.1 F (36.7 C) (Oral)   Resp 18  Wt 208 lb (94.3 kg)   SpO2 99%   BMI 33.57 kg/m   Visual Acuity Right Eye Distance:   Left Eye Distance:   Bilateral Distance:    Right Eye Near:   Left Eye Near:    Bilateral Near:     Physical Exam Constitutional:      Appearance: Normal appearance.  HENT:     Head: Normocephalic.  Eyes:     Extraocular Movements: Extraocular movements intact.     Comments: Erythema to the left conjunctiva and mild swelling to the periorbital, no drainage noted to the bilateral eyes, extraocular movements intact, vision grossly intact  Pulmonary:     Effort: Pulmonary effort is normal.  Abdominal:     General: There is no distension.     Tenderness: There is no abdominal tenderness. There is no right CVA tenderness or left CVA tenderness.     Comments: Tenderness over the right flank following along the bra line, no rash, ecchymosis, swelling  Skin:    General: Skin is warm and dry.  Neurological:     Mental Status: She is alert and oriented to person, place, and time. Mental status is at baseline.  Psychiatric:        Mood and Affect: Mood normal.        Behavior: Behavior normal.     UC Treatments / Results  Labs (all labs ordered are listed, but only abnormal results are displayed) Labs Reviewed - No data to display  EKG   Radiology No results found.  Procedures Procedures (including critical care time)  Medications Ordered in UC Medications - No data to  display  Initial Impression / Assessment and Plan / UC Course  I have reviewed the triage vital signs and the nursing notes.  Pertinent labs & imaging results that were available during my care of the patient were reviewed by me and considered in my medical decision making (see chart for details).  Herpes zoster without complication Bacterial conjunctivitis of both eyes  While no rash present at this time there is a burning sensation described with the pain, low suspicion for acute abdomen as there are no further associated symptoms, vital signs are stable, will move forward with coverage for herpes zoster, valacyclovir 7-day course prescribed, given written handout for additional supportive care if rash begins, may follow-up with urgent care or primary doctor for further evaluation and management as needed  Refill moxifloxacin prescription, recommended use for 7 days, may remove drainage with cool compresses and napkins to prevent irritation and spread, advised against eye rubbing or touching, advised use of oral antihistamine if itching continues to persist, may follow-up with urgent care as needed for persisting symptoms Final Clinical Impressions(s) / UC Diagnoses   Final diagnoses:  None   Discharge Instructions   None    ED Prescriptions   None    PDMP not reviewed this encounter.   Valinda Hoar, [place] 08/28/21 [phone]

## 2021-08-28 NOTE — ED Triage Notes (Signed)
Patient is here for "possible shingles". Noticed "pain, sharp, radiation of pain at times in upper right side". Various spots/areas on body "concerning". "Slight Fever of 99.7 last night". No cough. No sob. "Chills last night". Suppose to have "shingles vaccine recently, but didn't". History of Varicella at 61yo "when children had it".  ?

## 2021-08-28 NOTE — Discharge Instructions (Signed)
Take valacyclovir 3 times a day for 7 days ? ?Helping with itching and discomfort ?Put cold, wet cloths (cold compresses) on the area of the rash or blisters as told by your doctor. ?Cool baths can help you feel better. Try adding baking soda or dry oatmeal to the water to lessen itching. Do not bathe in hot water. ?Use calamine lotion as told by your doctor. ?If Blister and rash occur ?Keep your rash covered with a loose bandage (dressing). ?Wear loose clothing that does not rub on your rash. ?Wash your hands with soap and water for at least 20 seconds before and after you change your bandage. If you cannot use soap and water, use hand sanitizer. ?Change your bandage as told by your doctor. ?Keep your rash and blisters clean. To do this, wash the area with mild soap and cool water as told by your doctor. ?Check your rash every day for signs of infection. Check for: ?More redness, swelling, or pain. ?Fluid or blood. ?Warmth. ?Pus or a bad smell. ?Do not scratch your rash. Do not pick at your blisters. To help you to not scratch: ?Keep your fingernails clean and cut short. ?Wear gloves or mittens when you sleep, if scratching is a problem. ?General instructions ?If you touch your side wash your hands often with soap and water for at least 20 seconds. If you cannot use soap and water, use hand sanitizer. Doing this lowers your chance of getting a skin infection. ?Your infection can cause chickenpox in people who have never had chickenpox or never got a chickenpox vaccine shot. If you have blisters that did not change into scabs yet, try not to touch other people  ? ?Today you being treated for bacterial conjunctivitis.  ? ?Place one drop of Moxifloxacin into the effected eye every 4 hours while awake for 7 days. If the other eye starts to have symptoms you may use medication in it as well. Do not allow tip of dropper to touch eye. ? ?May use cool compress for comfort and to remove discharge if present. Pat the eye, do  not wipe. ? ?Do not rub eyes, this may cause more irritation. ? ?May use benadryl as needed to help if itching present. ? ?Please avoid use of eye makeup until symptoms clear. ? ?if symptoms persist after use of medication, please follow up at Urgent Care or with ophthalmologist (eye doctor)  ?

## 2021-09-04 ENCOUNTER — Ambulatory Visit
Admission: RE | Admit: 2021-09-04 | Discharge: 2021-09-04 | Disposition: A | Discharge status: Home / Self Care | Payer: BC Managed Care – PPO | Attending: Urology | Admitting: Urology

## 2021-09-04 ENCOUNTER — Other Ambulatory Visit: Payer: Self-pay

## 2021-09-04 ENCOUNTER — Ambulatory Visit: Payer: BC Managed Care – PPO | Admitting: Urology

## 2021-09-04 ENCOUNTER — Ambulatory Visit
Admission: RE | Admit: 2021-09-04 | Discharge: 2021-09-04 | Disposition: A | Discharge status: Home / Self Care | Payer: BC Managed Care – PPO | Source: Ambulatory Visit | Attending: Urology | Admitting: Urology

## 2021-09-04 ENCOUNTER — Encounter: Payer: Self-pay | Admitting: Urology

## 2021-09-04 VITALS — BP 104/71 | HR 83 | Ht 66.0 in | Wt 211.0 lb

## 2021-09-04 DIAGNOSIS — R3915 Urgency of urination: Secondary | ICD-10-CM | POA: Diagnosis not present

## 2021-09-04 DIAGNOSIS — Z87442 Personal history of urinary calculi: Secondary | ICD-10-CM

## 2021-09-04 DIAGNOSIS — R109 Unspecified abdominal pain: Secondary | ICD-10-CM

## 2021-09-04 DIAGNOSIS — N200 Calculus of kidney: Secondary | ICD-10-CM

## 2021-09-04 DIAGNOSIS — R509 Fever, unspecified: Secondary | ICD-10-CM

## 2021-09-04 LAB — URINALYSIS, COMPLETE
Bilirubin, UA: NEGATIVE
Glucose, UA: NEGATIVE
Ketones, UA: NEGATIVE
Nitrite, UA: NEGATIVE
Protein,UA: NEGATIVE
RBC, UA: NEGATIVE
Specific Gravity, UA: 1.02 (ref 1.005–1.030)
Urobilinogen, Ur: 0.2 mg/dL (ref 0.2–1.0)
pH, UA: 6 (ref 5.0–7.5)

## 2021-09-04 LAB — MICROSCOPIC EXAMINATION: WBC, UA: >30 /hpf — AB (ref 0–5)

## 2021-09-04 MED ORDER — TAMSULOSIN HCL 0.4 MG PO CAPS: 0.4000 mg | ORAL_CAPSULE | Freq: Every day | ORAL | 0 refills | Status: DC

## 2021-09-04 MED ORDER — CEFUROXIME AXETIL 500 MG PO TABS: 500.0000 mg | ORAL_TABLET | Freq: Two times a day (BID) | ORAL | 0 refills | Status: DC

## 2021-09-04 NOTE — Progress Notes (Signed)
? ?09/04/2021 ?3:25 PM  ? ?Sharon [name] ?16-Apr-1961 ?[id number] ? ?Referring provider:  ?No referring provider defined for this encounter. ?Chief Complaint  ?Patient presents with  ? Nephrolithiasis  ? ? ? ?HPI: ?Sharon [name] is a 61 y.o.female with a personal history of recurrent nephrolithiasis with urosepsis, recurrent UTI, and OAB wet who presents today for possible kidney stones.  ? ?She underwent CT stone study on 01/27/2018 with no urolithiasis.  KUB dated 03/30/2019 with no radiopaque stones.  She underwent cystoscopy with on 12/07/2019 with no significant findings. ? ?Her most recent RUS was in 2020 it showed negative for hydronephrosis and hypoechoic structures in left kidney are most compatible with cysts. ? ?She was seen in urgent care last week with right sided flank pain. ? ?KUB today was personally interpreted and reviewed. It visualized a possible 3 mm calcification in leftUVJ but otherwise unremarkable.  ? ?She reports urgency, frequency, lower abdominal pain, and LLQ pain. She reports that she recently had shingles she started having a fever she had a fever of 106 degrees and she rechecked it went down to 101 over the weekend that has subsided. She started experiencing issues with her bladder. It was worse at work yesterday. She has left flank pain that radiates toward her back.  ? ?Her UA today shows >30 WBCs no RBCs. ? ?PMH: ?Past Medical History:  ?Diagnosis Date  ? Acid reflux   ? H/O PRIOR TO GASTRC BYPASS  ? Anemia   ? H/O  ? Anxiety   ? Arthritis   ? Bladder spasm   ? Depression   ? Headache   ? H/O MIGRAINES  ? History of kidney stones   ? Left ureteral stone   ? Leg DVT (deep venous thromboembolism), acute (HCC)   ? x 2-both times after a surgery-went to see hematologist 2017 (Dr Grayland Ormond)  and no clotting disorder ever found  ? Restless leg syndrome   ? Sepsis ([place])   ? FROM KIDNEY STONES  ? Sleep apnea   ? HAD GASTRIC BYPASS AND DOES NOT HAVE SLEEP APNEA  ? Urge incontinence    ? ? ?Surgical History: ?Past Surgical History:  ?Procedure Laterality Date  ? ABDOMINAL HYSTERECTOMY    ? TOTAL ABDOMINAL HYSTERECTOMY W/ BILATERAL SALPINGOOPHORECTOMY  ? BARIATRIC SURGERY  05/15/2016  ? CHOLECYSTECTOMY    ? ESOPHAGOGASTRODUODENOSCOPY  05/15/2016  ? KIDNEY STONE SURGERY    ? KNEE ARTHROSCOPY Right   ? X 2  ? KNEE ARTHROSCOPY Right 11/11/2018  ? Procedure: Partial right knee arthroscopy,arthroscopic medial and lateral menisectomy, plica excision;  Surgeon: Hessie Knows, MD;  Location: [facility];  Service: Orthopedics;  Laterality: Right;  ? KNEE SURGERY Left 2006  ? SHOULDER ARTHROSCOPY WITH OPEN ROTATOR CUFF REPAIR Right 11/22/2015  ? Procedure: SHOULDER ARTHROSCOPY, DEBRIDEMENT, DECOMPRESSION, SLAP REPAIR, POSSIBLE BICEP TENODESIS;  Surgeon: Corky Mull, MD;  Location: [facility];  Service: Orthopedics;  Laterality: Right;  ? SINUS EXPLORATION    ? ? ?Home Medications:  ?Allergies as of 09/04/2021   ? ?   Reactions  ? Alcohol Other (See Comments)  ? wool  ? Bacitracin-neomycin-polymyxin Other (See Comments)  ? Bacitracin-polymyxin B   ? Other reaction(s): rash  ? Codeine Nausea Only, Other (See Comments)  ? "headache" ?Other reaction(s): rash  ? Hydrocodone Other (See Comments)  ? Other reaction(s): rash  ? Hydrocortisone Other (See Comments)  ? Lanolin Other (See Comments)  ? "rash" ?Other reaction(s): Unknown  ? Neomycin   ?  Norepinephrine Bitartrate   ? Other Other (See Comments)  ? Bitartrate/black rubber dye/elastic ?Other reaction(s): Unknown  ? Oxycodone-acetaminophen   ? Other reaction(s): rash  ? Penicillin G Sodium   ? Other reaction(s): rash  ? Tape Other (See Comments)  ? Other reaction(s): Unknown-OK TO USE PAPER TAPE  ? Tapentadol Other (See Comments)  ? Tramadol Other (See Comments)  ? "headache"  ? Tramadol Hcl   ? Other reaction(s): headaches  ? Trimethoprim   ? Wound Dressings   ? Other reaction(s): rash  ? Bacitracin Rash  ? Clarithromycin Rash  ? Latex Rash  ? Other reaction(s):  rash ?Other reaction(s): rash  ? Nickel Rash, Other (See Comments)  ? Oxycodone Rash  ? Penicillins Rash  ? Has patient had a PCN reaction causing immediate rash, facial/tongue/throat swelling, SOB or lightheadedness with hypotension: yes ?Has patient had a PCN reaction causing severe rash involving mucus membranes or skin necrosis: no ?Has patient had a PCN reaction that required hospitalization No ?Has patient had a PCN reaction occurring within the last 10 years: No ?If all of the above answers are "NO", then may proceed with Cephalosporin use.  ? Sulfamethoxazole-trimethoprim Rash  ? ?  ? ?  ?Medication List  ?  ? ?  ? Accurate as of September 04, 2021 11:59 PM. If you have any questions, ask your nurse or doctor.  ?  ?  ? ?  ? ?STOP taking these medications   ? ?benzonatate 200 MG capsule ?Commonly known as: TESSALON ?Stopped by: Hollice Espy, MD ?  ?oseltamivir 75 MG capsule ?Commonly known as: TAMIFLU ?Stopped by: Hollice Espy, MD ?  ? ?  ? ?TAKE these medications   ? ?cefUROXime 500 MG tablet ?Commonly known as: CEFTIN ?Take 1 tablet (500 mg total) by mouth 2 (two) times daily with a meal. ?Started by: Hollice Espy, MD ?  ?doxylamine (Sleep) 25 MG tablet ?Commonly known as: UNISOM ?Take 25 mg by mouth at bedtime as needed. ?  ?levocetirizine 5 MG tablet ?Commonly known as: XYZAL ?Take 1 tablet by mouth every evening. ?  ?mometasone 0.1 % cream ?Commonly known as: ELOCON ?1 application to affected area ?  ?montelukast 10 MG tablet ?Commonly known as: SINGULAIR ?Take 10 mg by mouth at bedtime. ?  ?moxifloxacin 0.5 % ophthalmic solution ?Commonly known as: Vigamox ?Place 1 drop into both eyes 3 (three) times daily. ?  ?PATADAY OP ?1 drop into affected eye ?  ?tamsulosin 0.4 MG Caps capsule ?Commonly known as: FLOMAX ?Take 1 capsule (0.4 mg total) by mouth daily. ?Started by: Hollice Espy, MD ?  ?triamcinolone 55 MCG/ACT Aero nasal inhaler ?Commonly known as: NASACORT ?1 spray in each nostril ?   ?valACYclovir 1000 MG tablet ?Commonly known as: VALTREX ?Take 1 tablet (1,000 mg total) by mouth 3 (three) times daily for 7 days. ?  ? ?  ? ? ?Allergies:  ?Allergies  ?Allergen Reactions  ? Alcohol Other (See Comments)  ?  wool  ? Bacitracin-Neomycin-Polymyxin Other (See Comments)  ? Bacitracin-Polymyxin B   ?  Other reaction(s): rash  ? Codeine Nausea Only and Other (See Comments)  ?  "headache" ?Other reaction(s): rash  ? Hydrocodone Other (See Comments)  ?  Other reaction(s): rash  ? Hydrocortisone Other (See Comments)  ? Lanolin Other (See Comments)  ?  "rash" ?Other reaction(s): Unknown  ? Neomycin   ? Norepinephrine Bitartrate   ? Other Other (See Comments)  ?  Bitartrate/black rubber dye/elastic ?Other reaction(s): Unknown  ?  Oxycodone-Acetaminophen   ?  Other reaction(s): rash  ? Penicillin G Sodium   ?  Other reaction(s): rash  ? Tape Other (See Comments)  ?  Other reaction(s): Unknown-OK TO USE PAPER TAPE  ? Tapentadol Other (See Comments)  ? Tramadol Other (See Comments)  ?  "headache"  ? Tramadol Hcl   ?  Other reaction(s): headaches  ? Trimethoprim   ? Wound Dressings   ?  Other reaction(s): rash  ? Bacitracin Rash  ? Clarithromycin Rash  ? Latex Rash  ?  Other reaction(s): rash ?Other reaction(s): rash  ? Nickel Rash and Other (See Comments)  ? Oxycodone Rash  ? Penicillins Rash  ?  Has patient had a PCN reaction causing immediate rash, facial/tongue/throat swelling, SOB or lightheadedness with hypotension: yes ?Has patient had a PCN reaction causing severe rash involving mucus membranes or skin necrosis: no ?Has patient had a PCN reaction that required hospitalization No ?Has patient had a PCN reaction occurring within the last 10 years: No ?If all of the above answers are "NO", then may proceed with Cephalosporin use. ?  ? Sulfamethoxazole-Trimethoprim Rash  ? ? ?Family History: ?Family History  ?Problem Relation Age of Onset  ? Cancer Mother   ?     rectal  ? Lung cancer Father   ? Hematuria  Brother   ? Prostate cancer Brother   ? Kidney Stones Brother   ? Kidney disease Paternal Grandfather   ? Bladder Cancer Neg Hx   ? Kidney cancer Neg Hx   ? ? ?Social History:  reports that she has never smoked. She has nev

## 2021-09-07 LAB — CULTURE, URINE COMPREHENSIVE

## 2021-09-09 ENCOUNTER — Telehealth: Payer: Self-pay

## 2021-09-09 DIAGNOSIS — R1032 Left lower quadrant pain: Secondary | ICD-10-CM

## 2021-09-09 NOTE — Telephone Encounter (Signed)
Incoming call from pt who states she is still having LT sided flank pain, she is requesting the results of her KUB and next steps. Please advise.  ?

## 2021-09-10 ENCOUNTER — Ambulatory Visit
Admission: RE | Admit: 2021-09-10 | Discharge: 2021-09-10 | Disposition: A | Discharge status: Home / Self Care | Payer: BC Managed Care – PPO | Source: Ambulatory Visit | Attending: Urology | Admitting: Urology

## 2021-09-10 DIAGNOSIS — R1032 Left lower quadrant pain: Secondary | ICD-10-CM | POA: Diagnosis present

## 2021-09-10 NOTE — Telephone Encounter (Signed)
Patient informed, she is still having ongoing left flank pain. Denies fevers or any other symptoms. CT orders placed. Voiced understanding.  ?

## 2021-09-10 NOTE — Telephone Encounter (Signed)
Please let her know that her KUB was negative, no stones.  If she still having flank pain, get a CT stone protocol, please order it stat. ? ?Hollice Espy, MD ? ?

## 2021-09-11 ENCOUNTER — Telehealth: Payer: Self-pay | Admitting: *Deleted

## 2021-09-11 DIAGNOSIS — R911 Solitary pulmonary nodule: Secondary | ICD-10-CM

## 2021-09-11 NOTE — Telephone Encounter (Addendum)
Patient informed, voiced understanding. Would like to proceed with CT Chest in 3 months. Order placed.  ? ? ?----- Message from Hollice Espy, MD sent at 09/11/2021 11:03 AM EDT ----- ?There is no stones in either of your kidneys or in the ureter tubes which is great news.  There was an incidental finding of pulmonary nodules at the the right base of your lung.  The radiologist has recommended getting a follow-up chest CT in 3 to 6 months.  In general, your primary care doctor should be the one to order this however there is no PCP listed in your chart.  If you do not have 1, I can order the noncontrast CT of the chest and 3 months as recommended. ? ?Hollice Espy, MD ? ?

## 2021-09-17 ENCOUNTER — Telehealth: Payer: Self-pay | Admitting: Urology

## 2021-09-17 DIAGNOSIS — R911 Solitary pulmonary nodule: Secondary | ICD-10-CM

## 2021-09-17 NOTE — Telephone Encounter (Signed)
Sharon [name] left a voicemail needing a referral to a Dr. Broadus John Covert (pulmonologist), per radiologist recommendation from her CT scan regarding a lung mass. Please advise patient. ?

## 2021-09-17 NOTE — Telephone Encounter (Signed)
Ok to place referral ? ?Hollice Espy, MD ? ?

## 2021-09-17 NOTE — Telephone Encounter (Signed)
Referral placed, patient aware 

## 2021-09-18 ENCOUNTER — Ambulatory Visit: Payer: BC Managed Care – PPO | Admitting: Urology

## 2021-12-02 DIAGNOSIS — Z01818 Encounter for other preprocedural examination: Secondary | ICD-10-CM | POA: Insufficient documentation

## 2021-12-02 DIAGNOSIS — R918 Other nonspecific abnormal finding of lung field: Secondary | ICD-10-CM | POA: Insufficient documentation

## 2021-12-07 ENCOUNTER — Ambulatory Visit
Admission: EM | Admit: 2021-12-07 | Discharge: 2021-12-07 | Disposition: A | Discharge status: Home / Self Care | Payer: BC Managed Care – PPO | Attending: Internal Medicine | Admitting: Internal Medicine

## 2021-12-07 ENCOUNTER — Encounter: Payer: Self-pay | Admitting: Emergency Medicine

## 2021-12-07 DIAGNOSIS — J069 Acute upper respiratory infection, unspecified: Secondary | ICD-10-CM | POA: Diagnosis not present

## 2021-12-07 DIAGNOSIS — J0100 Acute maxillary sinusitis, unspecified: Secondary | ICD-10-CM | POA: Diagnosis not present

## 2021-12-07 MED ORDER — AZITHROMYCIN 250 MG PO TABS: ORAL_TABLET | ORAL | 0 refills | Status: DC

## 2021-12-07 MED ORDER — FLUCONAZOLE 150 MG PO TABS: 150.0000 mg | ORAL_TABLET | Freq: Once | ORAL | 0 refills | Status: AC

## 2021-12-07 MED ORDER — BENZONATATE 200 MG PO CAPS: 200.0000 mg | ORAL_CAPSULE | Freq: Two times a day (BID) | ORAL | 0 refills | Status: DC | PRN

## 2021-12-07 NOTE — ED Triage Notes (Signed)
Patient states that on Monday the [facility] in her house went out and that she had to sleep that night with no AC but the ceiling fans. Patient states that 3 days ago she started having nasal congestion, cough and sinus drainage.  Patient denies fevers.

## 2021-12-07 NOTE — Discharge Instructions (Signed)
Do saline nose rinses twice a day, but avoid doing it before bed time.

## 2021-12-07 NOTE — ED Provider Notes (Signed)
[facility]    CSN: [id number] Arrival date & time: 12/07/21  0849      History   Chief Complaint Chief Complaint  Patient presents with   Cough   Nasal Congestion    HPI Sharon [name] is a 61 y.o. female who presents with nose congestion, PND and cough x 3 days. Denies a fever. She slept with a fan on her when her AC broke down this week, and feels she got a cold from this. Has hx of multiple sinus surgeries and her next ENT FU in next month. This am noticed green post nasal drainage and is coughing yellow mucous. Denies SOB, HA, myalgias or change in her appetite. Has not done any saline rinses.    Past Medical History:  Diagnosis Date   Acid reflux    H/O PRIOR TO GASTRC BYPASS   Anemia    H/O   Anxiety    Arthritis    Bladder spasm    Depression    Headache    H/O MIGRAINES   History of kidney stones    Left ureteral stone    Leg DVT (deep venous thromboembolism), acute ([place])    x 2-both times after a surgery-went to see hematologist 2017 (Dr Grayland Ormond)  and no clotting disorder ever found   Restless leg syndrome    Sepsis ([place])    FROM KIDNEY STONES   Sleep apnea    HAD GASTRIC BYPASS AND DOES NOT HAVE SLEEP APNEA   Urge incontinence     Patient Active Problem List   Diagnosis Date Noted   Allergic rhinitis 07/04/2021   Allergic rhinitis due to animal (cat) (dog) hair and dander 07/04/2021   Allergic rhinitis due to pollen 07/04/2021   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/2021   Urticaria 07/04/2021   Acquired pes planus of left foot 07/01/2021   Pain in left foot 07/01/2021   Tendinitis of left foot 07/01/2021   Anxiety 03/30/2019   Depression 03/30/2019   Migraine 03/30/2019   Marginal ulcer 03/24/2017   S/P gastric bypass 03/24/2017   Sepsis ([place]) 11/15/2016   Hydronephrosis 11/15/2016   GERD (gastroesophageal reflux disease) 11/15/2016   UTI (urinary tract infection) 11/15/2016   Right leg DVT ([place]) 01/24/2016   Acute deep  vein thrombosis (DVT) of popliteal vein of right lower extremity ([place]) 01/04/2016   Incomplete tear of right rotator cuff 11/22/2015   Moderate obstructive sleep apnea 09/21/2015   Kidney stones 09/10/2015   Urge incontinence 09/10/2015   Nocturia 09/10/2015   Joint pain 09/05/2015   S/P laparoscopic cholecystectomy 09/05/2015   Paralabral cyst of shoulder, right, subsequent encounter 01/10/2015   Rotator cuff tendinitis, right 01/10/2015   Tear of right glenoid labrum 01/10/2015   Acute ankle pain 11/16/2013   Acute shoulder pain 11/16/2013    Past Surgical History:  Procedure Laterality Date   ABDOMINAL HYSTERECTOMY     TOTAL ABDOMINAL HYSTERECTOMY W/ BILATERAL SALPINGOOPHORECTOMY   BARIATRIC SURGERY  05/15/2016   CHOLECYSTECTOMY     ESOPHAGOGASTRODUODENOSCOPY  05/15/2016   KIDNEY STONE SURGERY     KNEE ARTHROSCOPY Right    X 2   KNEE ARTHROSCOPY Right 11/11/2018   Procedure: Partial right knee arthroscopy,arthroscopic medial and lateral menisectomy, plica excision;  Surgeon: Hessie Knows, MD;  Location: [facility];  Service: Orthopedics;  Laterality: Right;   KNEE SURGERY Left 2006   SHOULDER ARTHROSCOPY WITH OPEN ROTATOR CUFF REPAIR Right 11/22/2015   Procedure: SHOULDER ARTHROSCOPY, DEBRIDEMENT, DECOMPRESSION, SLAP REPAIR, POSSIBLE BICEP TENODESIS;  Surgeon: Corky Mull, MD;  Location: [facility];  Service: Orthopedics;  Laterality: Right;   SINUS EXPLORATION      OB History   No obstetric history on file.      Home Medications    Prior to Admission medications   Medication Sig Start Date End Date Taking? Authorizing Provider  azithromycin (ZITHROMAX Z-PAK) 250 MG tablet 2 today, then one qd x 4 12/07/21  Yes Rodriguez-Southworth, Sunday Spillers, PA-C  benzonatate (TESSALON) 200 MG capsule Take 1 capsule (200 mg total) by mouth 2 (two) times daily as needed for cough. 12/07/21  Yes Rodriguez-Southworth, Sunday Spillers, PA-C  levocetirizine (XYZAL) 5 MG tablet Take 1 tablet by mouth every  evening.   Yes [provider]  montelukast (SINGULAIR) 10 MG tablet Take 10 mg by mouth at bedtime.   Yes [provider]  doxylamine, Sleep, (UNISOM) 25 MG tablet Take 25 mg by mouth at bedtime as needed.    [provider]  mometasone (ELOCON) 0.1 % cream 1 application to affected area    [provider]  moxifloxacin (VIGAMOX) 0.5 % ophthalmic solution Place 1 drop into both eyes 3 (three) times daily. 08/28/21   White, Leitha Schuller, NP  Olopatadine HCl (PATADAY OP) 1 drop into affected eye    [provider]  tamsulosin (FLOMAX) 0.4 MG CAPS capsule Take 1 capsule (0.4 mg total) by mouth daily. 09/04/21   Hollice Espy, MD  triamcinolone (NASACORT) 55 MCG/ACT AERO nasal inhaler 1 spray in each nostril    [provider]  apixaban (ELIQUIS) 5 MG TABS tablet Take 1 tablet (5 mg total) by mouth 2 (two) times daily. 11/11/18 04/15/19  Hessie Knows, MD    Family History Family History  Problem Relation Age of Onset   Cancer Mother        rectal   Lung cancer Father    Hematuria Brother    Prostate cancer Brother    Kidney Stones Brother    Kidney disease Paternal Grandfather    Bladder Cancer Neg Hx    Kidney cancer Neg Hx     Social History Social History   Tobacco Use   Smoking status: Never   Smokeless tobacco: Never  Vaping Use   Vaping Use: Never used  Substance Use Topics   Alcohol use: No    Alcohol/week: 0.0 standard drinks of alcohol   Drug use: No     Allergies   Alcohol, Bacitracin-neomycin-polymyxin, Bacitracin-polymyxin b, Codeine, Hydrocodone, Hydrocortisone, Lanolin, Neomycin, Norepinephrine bitartrate, Other, Oxycodone-acetaminophen, Penicillin g sodium, Tape, Tapentadol, Tramadol, Tramadol hcl, Trimethoprim, Wound dressings, Bacitracin, Clarithromycin, Latex, Nickel, Oxycodone, Penicillins, and Sulfamethoxazole-trimethoprim   Review of Systems Review of Systems  Constitutional:  Negative for appetite  change, chills, diaphoresis, fatigue and fever.  HENT:  Positive for congestion, postnasal drip, rhinorrhea and sinus pain. Negative for ear discharge, ear pain, sore throat and trouble swallowing.   Eyes:  Negative for discharge.  Respiratory:  Positive for cough. Negative for chest tightness and shortness of breath.   Musculoskeletal:  Negative for myalgias.  Neurological:  Negative for headaches.     Physical Exam Triage Vital Signs ED Triage Vitals  Enc Vitals Group     BP 12/07/21 0910 (!) 141/74     Pulse Rate 12/07/21 0910 93     Resp 12/07/21 0910 14     Temp 12/07/21 0910 97.9 F (36.6 C)     Temp Source 12/07/21 0910 Oral     SpO2 12/07/21 0910 97 %  Weight 12/07/21 0908 210 lb (95.3 kg)     Height 12/07/21 0908 '5\' 6"'$  (1.676 m)     Head Circumference --      Peak Flow --      Pain Score 12/07/21 0908 0     Pain Loc --      Pain Edu? --      Excl. in [place]? --    No data found.  Updated Vital Signs BP (!) 141/74 (BP Location: Left Arm)   Pulse 93   Temp 97.9 F (36.6 C) (Oral)   Resp 14   Ht '5\' 6"'$  (1.676 m)   Wt 210 lb (95.3 kg)   SpO2 97%   BMI 33.89 kg/m   Visual Acuity Right Eye Distance:   Left Eye Distance:   Bilateral Distance:    Right Eye Near:   Left Eye Near:    Bilateral Near:     Physical Exam Vitals and nursing note reviewed.  Constitutional:      General: She is not in acute distress.    Appearance: She is not toxic-appearing.  HENT:     Right Ear: Tympanic membrane, ear canal and external ear normal.     Left Ear: Tympanic membrane, ear canal and external ear normal.     Nose: Congestion present.     Comments: Lime green mucous noted in both nostrils    Mouth/Throat:     Mouth: Mucous membranes are moist.     Pharynx: Oropharynx is clear.     Comments: Both maxillary sinuses are tender Eyes:     General: No scleral icterus.    Conjunctiva/sclera: Conjunctivae normal.  Cardiovascular:     Rate and Rhythm: Normal rate and  regular rhythm.  Pulmonary:     Effort: Pulmonary effort is normal.     Breath sounds: Normal breath sounds.  Musculoskeletal:        General: Normal range of motion.     Cervical back: Neck supple.  Lymphadenopathy:     Cervical: No cervical adenopathy.  Skin:    General: Skin is warm and dry.  Neurological:     Mental Status: She is alert and oriented to person, place, and time.     Gait: Gait normal.  Psychiatric:        Mood and Affect: Mood normal.        Behavior: Behavior normal.        Thought Content: Thought content normal.        Judgment: Judgment normal.      UC Treatments / Results  Labs (all labs ordered are listed, but only abnormal results are displayed) Labs Reviewed - No data to display  EKG   Radiology No results found.  Procedures Procedures (including critical care time)  Medications Ordered in UC Medications - No data to display  Initial Impression / Assessment and Plan / UC Course  I have reviewed the triage vital signs and the nursing notes.  Acute maxillary sinusitis   I placed her on Tessalon and Azithromycin as noted since she states this has worked for her before. I also sent Diflucan as noted since she is prone to yeast with antibiotics      Final Clinical Impressions(s) / UC Diagnoses   Final diagnoses:  Acute maxillary sinusitis, recurrence not specified     Discharge Instructions      Do saline nose rinses twice a day, but avoid doing it before bed time.      ED  Prescriptions     Medication Sig Dispense Auth. Provider   azithromycin (ZITHROMAX Z-PAK) 250 MG tablet 2 today, then one qd x 4 6 tablet Rodriguez-Southworth, Trishelle Devora, PA-C   benzonatate (TESSALON) 200 MG capsule Take 1 capsule (200 mg total) by mouth 2 (two) times daily as needed for cough. 30 capsule Rodriguez-Southworth, Sunday Spillers, PA-C      PDMP not reviewed this encounter.   Shelby Mattocks, [place] 12/07/21 [phone]

## 2022-02-11 ENCOUNTER — Encounter: Payer: Self-pay | Admitting: Dermatology

## 2022-02-11 ENCOUNTER — Ambulatory Visit: Payer: BC Managed Care – PPO | Admitting: Dermatology

## 2022-02-11 DIAGNOSIS — L821 Other seborrheic keratosis: Secondary | ICD-10-CM | POA: Diagnosis not present

## 2022-02-11 DIAGNOSIS — L578 Other skin changes due to chronic exposure to nonionizing radiation: Secondary | ICD-10-CM | POA: Diagnosis not present

## 2022-02-11 DIAGNOSIS — D229 Melanocytic nevi, unspecified: Secondary | ICD-10-CM

## 2022-02-11 DIAGNOSIS — L814 Other melanin hyperpigmentation: Secondary | ICD-10-CM | POA: Diagnosis not present

## 2022-02-11 NOTE — Progress Notes (Signed)
   New Patient Visit  Subjective  Sharon [name] is a 61 y.o. female who presents for the following: Skin Problem (Patient here today for a new spot that came up at back about 2 1/2-3 weeks ago. No itching, no bleeding. ).  No hx skin cancer.  The following portions of the chart were reviewed this encounter and updated as appropriate:   Tobacco  Allergies  Meds  Problems  Med Hx  Surg Hx  Fam Hx      Review of Systems:  No other skin or systemic complaints except as noted in HPI or Assessment and Plan.  Objective  Well appearing patient in no apparent distress; mood and affect are within normal limits.  A focused examination was performed including back. Relevant physical exam findings are noted in the Assessment and Plan.  Right Mid Back Stuck-on, waxy, tan-brown papules and plaques -- Discussed benign etiology and prognosis.     Assessment & Plan  Seborrheic keratosis Right Mid Back  Benign-appearing.  Observation.  Call clinic for new or changing lesions.    Melanocytic Nevi - Tan-brown and/or pink-flesh-colored symmetric macules and papules - Benign appearing on exam today - Observation - Call clinic for new or changing moles - Recommend daily use of broad spectrum spf 30+ sunscreen to sun-exposed areas.   Actinic Damage - chronic, secondary to cumulative UV radiation exposure/sun exposure over time - diffuse scaly erythematous macules with underlying dyspigmentation - Recommend daily broad spectrum sunscreen SPF 30+ to sun-exposed areas, reapply every 2 hours as needed.  - Recommend staying in the shade or wearing long sleeves, sun glasses (UVA+UVB protection) and wide brim hats (4-inch brim around the entire circumference of the hat). - Call for new or changing lesions. - Recommend vitamin D 800 IU daily as she is good with sun protection.  Lentigines - Scattered tan macules - Due to sun exposure - Benign-appearing, observe - Recommend daily broad  spectrum sunscreen SPF 30+ to sun-exposed areas, reapply every 2 hours as needed. - Call for any changes  Return if symptoms worsen or fail to improve.   Graciella Belton, RMA, am acting as scribe for Forest Gleason, MD .  Documentation: I have reviewed the above documentation for accuracy and completeness, and I agree with the above.  Forest Gleason, MD

## 2022-02-11 NOTE — Patient Instructions (Addendum)
Recommend Vitamin D 800 iu daily.  Recommend mineral only sunscreen, Neutrogena Sheer Zinc, Blue Lizard are some good options.  Recommend taking Heliocare sun protection supplement daily in sunny weather for additional sun protection. For maximum protection on the sunniest days, you can take up to 2 capsules of regular Heliocare OR take 1 capsule of Heliocare Ultra. For prolonged exposure (such as a full day in the sun), you can repeat your dose of the supplement 4 hours after your first dose. Heliocare can be purchased at [employer], at some [facility] or at [email or web address].    Seborrheic Keratosis  What causes seborrheic keratoses? Seborrheic keratoses are harmless, common skin growths that first appear during adult life.  As time goes by, more growths appear.  Some people may develop a large number of them.  Seborrheic keratoses appear on both covered and uncovered body parts.  They are not caused by sunlight.  The tendency to develop seborrheic keratoses can be inherited.  They vary in color from skin-colored to gray, brown, or even black.  They can be either smooth or have a rough, warty surface.   Seborrheic keratoses are superficial and look as if they were stuck on the skin.  Under the microscope this type of keratosis looks like layers upon layers of skin.  That is why at times the top layer may seem to fall off, but the rest of the growth remains and re-grows.    Treatment Seborrheic keratoses do not need to be treated, but can easily be removed in the office.  Seborrheic keratoses often cause symptoms when they rub on clothing or jewelry.  Lesions can be in the way of shaving.  If they become inflamed, they can cause itching, soreness, or burning.  Removal of a seborrheic keratosis can be accomplished by freezing, burning, or surgery. If any spot bleeds, scabs, or grows rapidly, please return to have it checked, as these can be an indication of a skin cancer.  Due to recent  changes in healthcare laws, you may see results of your pathology and/or laboratory studies on MyChart before the doctors have had a chance to review them. We understand that in some cases there may be results that are confusing or concerning to you. Please understand that not all results are received at the same time and often the doctors may need to interpret multiple results in order to provide you with the best plan of care or course of treatment. Therefore, we ask that you please give [place] 2 business days to thoroughly review all your results before contacting the office for clarification. Should we see a critical lab result, you will be contacted sooner.   If You Need Anything After Your Visit  If you have any questions or concerns for your doctor, please call our main line at [phone] and press option 4 to reach your doctor's medical assistant. If no one answers, please leave a voicemail as directed and we will return your call as soon as possible. Messages left after 4 pm will be answered the following business day.   You may also send [place] a message via [place]. We typically respond to MyChart messages within 1-2 business days.  For prescription refills, please ask your pharmacy to contact our office. Our fax number is [phone].  If you have an urgent issue when the clinic is closed that cannot wait until the next business day, you can page your doctor at the number below.    Please note that  while we do our best to be available for urgent issues outside of office hours, we are not available 24/7.   If you have an urgent issue and are unable to reach [place], you may choose to seek medical care at your doctor's office, retail clinic, urgent care center, or emergency room.  If you have a medical emergency, please immediately call 911 or go to the emergency department.  Pager Numbers  - Dr. Nehemiah Massed: [phone]  - Dr. Laurence Ferrari: [phone]  - Dr. Nicole Kindred: [phone]  In the event of  inclement weather, please call our main line at [phone] for an update on the status of any delays or closures.  Dermatology Medication Tips: Please keep the boxes that topical medications come in in order to help keep track of the instructions about where and how to use these. Pharmacies typically print the medication instructions only on the boxes and not directly on the medication tubes.   If your medication is too expensive, please contact our office at [phone] option 4 or send [place] a message through [place].   We are unable to tell what your co-pay for medications will be in advance as this is different depending on your insurance coverage. However, we may be able to find a substitute medication at lower cost or fill out paperwork to get insurance to cover a needed medication.   If a prior authorization is required to get your medication covered by your insurance company, please allow [place] 1-2 business days to complete this process.  Drug prices often vary depending on where the prescription is filled and some pharmacies may offer cheaper prices.  The website www.goodrx.com contains coupons for medications through different pharmacies. The prices here do not account for what the cost may be with help from insurance (it may be cheaper with your insurance), but the website can give you the price if you did not use any insurance.  - You can print the associated coupon and take it with your prescription to the pharmacy.  - You may also stop by our office during regular business hours and pick up a GoodRx coupon card.  - If you need your prescription sent electronically to a different pharmacy, notify our office through [facility] or by phone at [phone] option 4.     Si Usted Necesita Algo Despus de Su Visita  Tambin puede enviarnos un mensaje a travs de Pharmacist, community. Por lo general respondemos a los mensajes de MyChart en el transcurso de 1 a 2 das hbiles.  Para renovar  recetas, por favor pida a su farmacia que se ponga en contacto con nuestra oficina. Harland Dingwall de fax es [place] [phone].  Si tiene un asunto urgente cuando la clnica est cerrada y que no puede esperar hasta el siguiente da hbil, puede llamar/localizar a su doctor(a) al nmero que aparece a continuacin.   Por favor, tenga en cuenta que aunque hacemos todo lo posible para estar disponibles para asuntos urgentes fuera del horario de [place], no estamos disponibles las 24 horas del da, los 7 das de la [place].   Si tiene un problema urgente y no puede comunicarse con nosotros, puede optar por buscar atencin mdica  en el consultorio de su doctor(a), en una clnica privada, en un centro de atencin urgente o en una sala de emergencias.  Si tiene Engineering geologist, por favor llame inmediatamente al 911 o vaya a la sala de emergencias.  Nmeros de bper  - Dr. Nehemiah Massed: [phone]  -  DraLaurence Ferrari: [phone]  - Dra. Nicole Kindred: [phone]  En caso de inclemencias del [place], por favor llame a Johnsie Kindred principal al [phone] para una actualizacin sobre el [place] de cualquier retraso o cierre.  Consejos para la medicacin en dermatologa: Por favor, guarde las cajas en las que vienen los medicamentos de uso tpico para ayudarle a seguir las instrucciones sobre dnde y cmo usarlos. Las farmacias generalmente imprimen las instrucciones del medicamento slo en las cajas y no directamente en los tubos del [place].   Si su medicamento es muy caro, por favor, pngase en contacto con Zigmund Daniel llamando al [phone] y presione la opcin 4 o envenos un mensaje a travs de Pharmacist, community.   No podemos decirle cul ser su copago por los medicamentos por adelantado ya que esto es diferente dependiendo de la cobertura de su seguro. Sin embargo, es posible que podamos encontrar un medicamento sustituto a Electrical engineer un formulario para que el seguro cubra el medicamento  que se considera necesario.   Si se requiere una autorizacin previa para que su compaa de seguros [place] su medicamento, por favor permtanos de 1 a 2 das hbiles para completar este proceso.  Los precios de los medicamentos varan con frecuencia dependiendo del Environmental consultant de dnde se surte la receta y alguna farmacias pueden ofrecer precios ms baratos.  El sitio web www.goodrx.com tiene cupones para medicamentos de Airline pilot. Los precios aqu no tienen en cuenta lo que podra costar con la ayuda del seguro (puede ser ms barato con su seguro), pero el sitio web puede darle el precio si no utiliz Research scientist (physical sciences).  - Puede imprimir el cupn correspondiente y llevarlo con su receta a la farmacia.  - Tambin puede pasar por nuestra oficina durante el horario de atencin regular y Charity fundraiser una tarjeta de cupones de GoodRx.  - Si necesita que su receta se enve electrnicamente a una farmacia diferente, informe a nuestra oficina a travs de MyChart de [place] o por telfono llamando al [phone] y presione la opcin 4.

## 2022-04-23 ENCOUNTER — Ambulatory Visit
Admission: EM | Admit: 2022-04-23 | Discharge: 2022-04-23 | Disposition: A | Discharge status: Home / Self Care | Payer: BC Managed Care – PPO | Attending: Emergency Medicine | Admitting: Emergency Medicine

## 2022-04-23 DIAGNOSIS — J0140 Acute pansinusitis, unspecified: Secondary | ICD-10-CM

## 2022-04-23 MED ORDER — AZITHROMYCIN 250 MG PO TABS: ORAL_TABLET | ORAL | 0 refills | Status: DC

## 2022-04-23 MED ORDER — BENZONATATE 200 MG PO CAPS: 200.0000 mg | ORAL_CAPSULE | Freq: Two times a day (BID) | ORAL | 0 refills | Status: DC | PRN

## 2022-04-23 NOTE — Discharge Instructions (Signed)
Today you are being treated for sinus infection  Begin azithromycin-taking 2 tablets on day 1 then 1 tablet the remaining days, this will provide coverage for bacteria which may be prolonging your symptoms  You may attempt use of Tessalon tablets taking 1 to 2 tablet every 12 hours as needed for management of coughing, you may also attempt over-the-counter Delsym for additional comfort  You may continue Tylenol sinus as needed if you have deemed helpful  May also attempt saline irrigation if able to tolerate  You may follow-up with urgent care or your primary doctor if symptoms continue to persist or worsen

## 2022-04-23 NOTE — ED Provider Notes (Signed)
[facility]    CSN: [id number] Arrival date & time: 04/23/22  4098      History   Chief Complaint Chief Complaint  Patient presents with   Cough   Headache   Nasal Congestion    HPI Sharon [name] is a 61 y.o. female.   Patient presents with cough, nasal congestion, rhinorrhea, sinus pain and pressure, sore throat and intermittent generalized headaches for 7 days.  Cough initially had clear sputum but is changed to green.,  Worse at nighttime, worsening headache.  Sinus pressure felt over the cheeks and the forehead.  Has noticed small spots of blood within the nasal mucus.  Tolerating food and liquids.  No known sick contacts.  Has been managing symptoms with Tylenol cold and sinus which has been somewhat helpful.  History of a pulmonary nodule.    Past Medical History:  Diagnosis Date   Acid reflux    H/O PRIOR TO GASTRC BYPASS   Anemia    H/O   Anxiety    Arthritis    Bladder spasm    Depression    Headache    H/O MIGRAINES   History of kidney stones    Left ureteral stone    Leg DVT (deep venous thromboembolism), acute ([place])    x 2-both times after a surgery-went to see hematologist 2017 (Dr Grayland Ormond)  and no clotting disorder ever found   Restless leg syndrome    Sepsis ([place])    FROM KIDNEY STONES   Sleep apnea    HAD GASTRIC BYPASS AND DOES NOT HAVE SLEEP APNEA   Urge incontinence     Patient Active Problem List   Diagnosis Date Noted   Allergic rhinitis 07/04/2021   Allergic rhinitis due to animal (cat) (dog) hair and dander 07/04/2021   Allergic rhinitis due to pollen 07/04/2021   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/2021   Urticaria 07/04/2021   Acquired pes planus of left foot 07/01/2021   Pain in left foot 07/01/2021   Tendinitis of left foot 07/01/2021   Anxiety 03/30/2019   Depression 03/30/2019   Migraine 03/30/2019   Marginal ulcer 03/24/2017   S/P gastric bypass 03/24/2017   Sepsis ([place]) 11/15/2016   Hydronephrosis  11/15/2016   GERD (gastroesophageal reflux disease) 11/15/2016   UTI (urinary tract infection) 11/15/2016   Right leg DVT ([place]) 01/24/2016   Acute deep vein thrombosis (DVT) of popliteal vein of right lower extremity ([place]) 01/04/2016   Incomplete tear of right rotator cuff 11/22/2015   Moderate obstructive sleep apnea 09/21/2015   Kidney stones 09/10/2015   Urge incontinence 09/10/2015   Nocturia 09/10/2015   Joint pain 09/05/2015   S/P laparoscopic cholecystectomy 09/05/2015   Paralabral cyst of shoulder, right, subsequent encounter 01/10/2015   Rotator cuff tendinitis, right 01/10/2015   Tear of right glenoid labrum 01/10/2015   Acute ankle pain 11/16/2013   Acute shoulder pain 11/16/2013    Past Surgical History:  Procedure Laterality Date   ABDOMINAL HYSTERECTOMY     TOTAL ABDOMINAL HYSTERECTOMY W/ BILATERAL SALPINGOOPHORECTOMY   BARIATRIC SURGERY  05/15/2016   CHOLECYSTECTOMY     ESOPHAGOGASTRODUODENOSCOPY  05/15/2016   KIDNEY STONE SURGERY     KNEE ARTHROSCOPY Right    X 2   KNEE ARTHROSCOPY Right 11/11/2018   Procedure: Partial right knee arthroscopy,arthroscopic medial and lateral menisectomy, plica excision;  Surgeon: Hessie Knows, MD;  Location: [facility];  Service: Orthopedics;  Laterality: Right;   KNEE SURGERY Left 2006   SHOULDER ARTHROSCOPY WITH OPEN  ROTATOR CUFF REPAIR Right 11/22/2015   Procedure: SHOULDER ARTHROSCOPY, DEBRIDEMENT, DECOMPRESSION, SLAP REPAIR, POSSIBLE BICEP TENODESIS;  Surgeon: Corky Mull, MD;  Location: [facility];  Service: Orthopedics;  Laterality: Right;   SINUS EXPLORATION      OB History   No obstetric history on file.      Home Medications    Prior to Admission medications   Medication Sig Start Date End Date Taking? Authorizing Provider  levocetirizine (XYZAL) 5 MG tablet Take 1 tablet by mouth every evening.   Yes [provider]  montelukast (SINGULAIR) 10 MG tablet Take 10 mg by mouth at bedtime.   Yes [provider]  triamcinolone (NASACORT) 55 MCG/ACT AERO nasal inhaler 1 spray in each nostril   Yes [provider]  azithromycin (ZITHROMAX Z-PAK) 250 MG tablet 2 today, then one qd x 4 12/07/21   Rodriguez-Southworth, Sunday Spillers, PA-C  benzonatate (TESSALON) 200 MG capsule Take 1 capsule (200 mg total) by mouth 2 (two) times daily as needed for cough. 12/07/21   Rodriguez-Southworth, Sunday Spillers, PA-C  doxylamine, Sleep, (UNISOM) 25 MG tablet Take 25 mg by mouth at bedtime as needed.    [provider]  mometasone (ELOCON) 0.1 % cream 1 application to affected area    [provider]  moxifloxacin (VIGAMOX) 0.5 % ophthalmic solution Place 1 drop into both eyes 3 (three) times daily. 08/28/21   Jeter Tomey, Leitha Schuller, NP  Olopatadine HCl (PATADAY OP) 1 drop into affected eye    [provider]  tamsulosin (FLOMAX) 0.4 MG CAPS capsule Take 1 capsule (0.4 mg total) by mouth daily. 09/04/21   Hollice Espy, MD  apixaban (ELIQUIS) 5 MG TABS tablet Take 1 tablet (5 mg total) by mouth 2 (two) times daily. 11/11/18 04/15/19  Hessie Knows, MD    Family History Family History  Problem Relation Age of Onset   Cancer Mother        rectal   Lung cancer Father    Hematuria Brother    Prostate cancer Brother    Kidney Stones Brother    Kidney disease Paternal Grandfather    Bladder Cancer Neg Hx    Kidney cancer Neg Hx     Social History Social History   Tobacco Use   Smoking status: Never   Smokeless tobacco: Never  Vaping Use   Vaping Use: Never used  Substance Use Topics   Alcohol use: No    Alcohol/week: 0.0 standard drinks of alcohol   Drug use: No     Allergies   Alcohol, Bacitracin-neomycin-polymyxin, Bacitracin-polymyxin b, Codeine, Hydrocodone, Hydrocortisone, Lanolin, Neomycin, Norepinephrine bitartrate, Other, Oxycodone-acetaminophen, Penicillin g sodium, Tape, Tapentadol, Tramadol, Tramadol hcl, Trimethoprim, Wound dressings, Bacitracin, Clarithromycin,  Latex, Nickel, Oxycodone, Penicillins, and Sulfamethoxazole-trimethoprim   Review of Systems Review of Systems  Constitutional: Negative.   HENT:  Positive for congestion, rhinorrhea, sinus pressure, sinus pain and sore throat. Negative for dental problem, drooling, ear discharge, ear pain, facial swelling, hearing loss, mouth sores, nosebleeds, postnasal drip, sneezing, tinnitus, trouble swallowing and voice change.   Respiratory:  Positive for cough. Negative for apnea, choking, chest tightness, shortness of breath, wheezing and stridor.   Neurological:  Positive for headaches. Negative for dizziness, tremors, seizures, syncope, facial asymmetry, speech difficulty, weakness, light-headedness and numbness.     Physical Exam Triage Vital Signs ED Triage Vitals  Enc Vitals Group     BP 04/23/22 0851 132/82     Pulse Rate 04/23/22 0851 87     Resp 04/23/22  0851 18     Temp 04/23/22 0851 98.4 F (36.9 C)     Temp Source 04/23/22 0851 Oral     SpO2 04/23/22 0851 95 %     Weight 04/23/22 0850 213 lb (96.6 kg)     Height 04/23/22 0850 '5\' 6"'$  (1.676 m)     Head Circumference --      Peak Flow --      Pain Score 04/23/22 0850 5     Pain Loc --      Pain Edu? --      Excl. in [place]? --    No data found.  Updated Vital Signs BP 132/82 (BP Location: Left Arm)   Pulse 87   Temp 98.4 F (36.9 C) (Oral)   Resp 18   Ht '5\' 6"'$  (1.676 m)   Wt 213 lb (96.6 kg)   SpO2 95%   BMI 34.38 kg/m   Visual Acuity Right Eye Distance:   Left Eye Distance:   Bilateral Distance:    Right Eye Near:   Left Eye Near:    Bilateral Near:     Physical Exam Constitutional:      Appearance: She is well-developed.  HENT:     Head: Normocephalic.     Right Ear: Tympanic membrane, ear canal and external ear normal.     Left Ear: Tympanic membrane, ear canal and external ear normal.     Nose: Congestion and rhinorrhea present.     Right Turbinates: Swollen.     Left Turbinates: Swollen.     Right  Sinus: Maxillary sinus tenderness and frontal sinus tenderness present.     Left Sinus: Maxillary sinus tenderness and frontal sinus tenderness present.     Mouth/Throat:     Mouth: Mucous membranes are moist.     Pharynx: Oropharynx is clear. No posterior oropharyngeal erythema.  Eyes:     Extraocular Movements: Extraocular movements intact.  Cardiovascular:     Rate and Rhythm: Normal rate and regular rhythm.     Pulses: Normal pulses.     Heart sounds: Normal heart sounds.  Pulmonary:     Effort: Pulmonary effort is normal.     Breath sounds: Normal breath sounds.  Neurological:     Mental Status: She is alert and oriented to person, place, and time. Mental status is at baseline.  Psychiatric:        Mood and Affect: Mood normal.        Behavior: Behavior normal.      UC Treatments / Results  Labs (all labs ordered are listed, but only abnormal results are displayed) Labs Reviewed - No data to display  EKG   Radiology No results found.  Procedures Procedures (including critical care time)  Medications Ordered in UC Medications - No data to display  Initial Impression / Assessment and Plan / UC Course  I have reviewed the triage vital signs and the nursing notes.  Pertinent labs & imaging results that were available during my care of the patient were reviewed by me and considered in my medical decision making (see chart for details).  Acute pansinusitis  Vital signs are stable patient is in no signs of distress nontoxic-appearing, presentation and symptomology is consistent with a sinusitis and as they have been present for 7 days we will attempt bacterial coverage, Z-Pak prescribed as she has done well with this medication in the past as well as Tessalon for additional support, may continue use of over-the-counter medications as needed  with follow-up with PCP or urgent care if symptoms persist or worsen Final Clinical Impressions(s) / UC Diagnoses   Final  diagnoses:  None   Discharge Instructions   None    ED Prescriptions   None    PDMP not reviewed this encounter.   Hans [place], [place] 04/23/22 [id number]

## 2022-04-23 NOTE — ED Triage Notes (Signed)
Pt c/o possible sinus infection x1week

## 2022-05-05 ENCOUNTER — Encounter: Payer: Self-pay | Admitting: Podiatry

## 2022-05-05 ENCOUNTER — Ambulatory Visit: Payer: BC Managed Care – PPO | Admitting: Podiatry

## 2022-05-05 ENCOUNTER — Ambulatory Visit (INDEPENDENT_AMBULATORY_CARE_PROVIDER_SITE_OTHER): Payer: BC Managed Care – PPO

## 2022-05-05 DIAGNOSIS — G5781 Other specified mononeuropathies of right lower limb: Secondary | ICD-10-CM

## 2022-05-05 DIAGNOSIS — M722 Plantar fascial fibromatosis: Secondary | ICD-10-CM

## 2022-05-05 MED ORDER — TRIAMCINOLONE ACETONIDE 40 MG/ML IJ SUSP
20.0000 mg | Freq: Once | INTRAMUSCULAR | Status: AC
  Administered 2022-05-05: 20 mg

## 2022-05-05 NOTE — Progress Notes (Signed)
Subjective:  Patient ID: Sharon [name], female    DOB: 07/31/1960,  MRN: [id number] HPI Chief Complaint  Patient presents with   Foot Pain    1st MPJ/medial foot/arch right - aching x 7 months, she had an episode where she felt like there was nerve pain in her leg and wasn't able to pick foot up and walk, burning sensations, went to [facility]-xrayed, wore boot, been using voltaren gel, soaking-no help, getting worse   New Patient (Initial Visit)    61 y.o. female presents with the above complaint.   ROS: Denies fever chills nausea vomit muscle aches pains calf pain back pain chest pain shortness of breath.  Past Medical History:  Diagnosis Date   Acid reflux    H/O PRIOR TO GASTRC BYPASS   Anemia    H/O   Anxiety    Arthritis    Bladder spasm    Depression    Headache    H/O MIGRAINES   History of kidney stones    Left ureteral stone    Leg DVT (deep venous thromboembolism), acute ([place])    x 2-both times after a surgery-went to see hematologist 2017 (Dr Grayland Ormond)  and no clotting disorder ever found   Restless leg syndrome    Sepsis ([place])    FROM KIDNEY STONES   Sleep apnea    HAD GASTRIC BYPASS AND DOES NOT HAVE SLEEP APNEA   Urge incontinence    Past Surgical History:  Procedure Laterality Date   ABDOMINAL HYSTERECTOMY     TOTAL ABDOMINAL HYSTERECTOMY W/ BILATERAL SALPINGOOPHORECTOMY   BARIATRIC SURGERY  05/15/2016   CHOLECYSTECTOMY     ESOPHAGOGASTRODUODENOSCOPY  05/15/2016   KIDNEY STONE SURGERY     KNEE ARTHROSCOPY Right    X 2   KNEE ARTHROSCOPY Right 11/11/2018   Procedure: Partial right knee arthroscopy,arthroscopic medial and lateral menisectomy, plica excision;  Surgeon: Hessie Knows, MD;  Location: [facility];  Service: Orthopedics;  Laterality: Right;   KNEE SURGERY Left 2006   SHOULDER ARTHROSCOPY WITH OPEN ROTATOR CUFF REPAIR Right 11/22/2015   Procedure: SHOULDER ARTHROSCOPY, DEBRIDEMENT, DECOMPRESSION, SLAP REPAIR, POSSIBLE BICEP TENODESIS;   Surgeon: Corky Mull, MD;  Location: [facility];  Service: Orthopedics;  Laterality: Right;   SINUS EXPLORATION      Current Outpatient Medications:    levocetirizine (XYZAL) 5 MG tablet, Take 1 tablet by mouth every evening., Disp: , Rfl:    montelukast (SINGULAIR) 10 MG tablet, Take 10 mg by mouth at bedtime., Disp: , Rfl:    triamcinolone (NASACORT) 55 MCG/ACT AERO nasal inhaler, 1 spray in each nostril, Disp: , Rfl:   Allergies  Allergen Reactions   Alcohol Other (See Comments)    wool   Bacitracin-Neomycin-Polymyxin Other (See Comments)   Bacitracin-Polymyxin B     Other reaction(s): rash   Codeine Nausea Only and Other (See Comments)    "headache" Other reaction(s): rash   Hydrocodone Other (See Comments)    Other reaction(s): rash   Hydrocortisone Other (See Comments)   Lanolin Other (See Comments)    "rash" Other reaction(s): Unknown   Neomycin    Norepinephrine Bitartrate    Other Other (See Comments)    Bitartrate/black rubber dye/elastic Other reaction(s): Unknown   Oxycodone-Acetaminophen     Other reaction(s): rash   Penicillin G Sodium     Other reaction(s): rash   Tape Other (See Comments)    Other reaction(s): Unknown-OK TO USE PAPER TAPE   Tapentadol Other (See Comments)  Tramadol Other (See Comments)    "headache"   Tramadol Hcl     Other reaction(s): headaches   Trimethoprim    Wound Dressings     Other reaction(s): rash   Bacitracin Rash   Clarithromycin Rash   Latex Rash    Other reaction(s): rash Other reaction(s): rash   Nickel Rash and Other (See Comments)   Oxycodone Rash   Penicillins Rash    Has patient had a PCN reaction causing immediate rash, facial/tongue/throat swelling, SOB or lightheadedness with hypotension: yes Has patient had a PCN reaction causing severe rash involving mucus membranes or skin necrosis: no Has patient had a PCN reaction that required hospitalization No Has patient had a PCN reaction occurring within the last  10 years: No If all of the above answers are "NO", then may proceed with Cephalosporin use.    Sulfamethoxazole-Trimethoprim Rash   Review of Systems Objective:  There were no vitals filed for this visit.  General: Well developed, nourished, in no acute distress, alert and oriented x3   Dermatological: Skin is warm, dry and supple bilateral. Nails x 10 are well maintained; remaining integument appears unremarkable at this time. There are no open sores, no preulcerative lesions, no rash or signs of infection present.  Vascular: Dorsalis Pedis artery and Posterior Tibial artery pedal pulses are 2/4 bilateral with immedate capillary fill time. Pedal hair growth present. No varicosities and no lower extremity edema present bilateral.   Neruologic: Grossly intact via light touch bilateral. Vibratory intact via tuning fork bilateral. Protective threshold with Semmes Wienstein monofilament intact to all pedal sites bilateral. Patellar and Achilles deep tendon reflexes 2+ bilateral. No Babinski or clonus noted bilateral.  Palpable neuroma third interspace of the right foot.  The pain that she feels there is very similar to the pain that she has been feeling and complaining in the forefoot.  Currently she has no pain in the back though she does relate back issues.  Musculoskeletal: No gross boney pedal deformities bilateral. No pain, crepitus, or limitation noted with foot and ankle range of motion bilateral. Muscular strength 5/5 in all groups tested bilateral.  Gait: Unassisted, Nonantalgic.    Radiographs:  Radiographs taken today demonstrate an osseously mature individual generalized diffuse demineralization of the bones.  She has considerable osteoarthritic changes of the midfoot.  Ankle appears to be relatively normal.  She does have a cystic lesion and what appears to be the middle or lateral cuneiform.  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  Plan:  I injected the neuroma today with 10 mg Kenalog 5 mg Marcaine.  Discussed the possible need for nerve conduction velocity exam or MRI of the forefoot.  Will follow-up with her in 1 month     Milliani Herrada T. [place], [place]

## 2022-06-18 ENCOUNTER — Ambulatory Visit: Payer: BC Managed Care – PPO | Admitting: Podiatry

## 2022-06-18 ENCOUNTER — Encounter: Payer: Self-pay | Admitting: Podiatry

## 2022-06-18 DIAGNOSIS — G5752 Tarsal tunnel syndrome, left lower limb: Secondary | ICD-10-CM | POA: Diagnosis not present

## 2022-06-18 DIAGNOSIS — G5781 Other specified mononeuropathies of right lower limb: Secondary | ICD-10-CM

## 2022-06-18 NOTE — Progress Notes (Signed)
She presents today for follow-up of her neurological symptoms from the forefoot right.  She states it felt good for about 10 days and now it hurts again.  She states that it never really got well as far as the pain in the leg the heel the bunion the generalized foot but she states that there was some improvement.  Objective: Vital signs are stable she is alert and oriented x 3 pulses are palpable to the foot.  She still has pain generally throughout the entire foot with pain radiating up the leg from the tarsal canal area.  I am concerned for the weakness and the near complete loss of sensation to the right lower extremity and actually having to drag the foot.  This definitely sounds neurological whether from stroke or radiculopathy or tarsal tunnel.  Assessment: Cannot rule out neuropathy tarsal tunnel syndrome or radiculopathy right lower extremity.  Plan: Requesting evaluation and treatment by neurology and EMG and nerve conduction velocity exam.  Should this fail to demonstrate the cause of her symptomatology then an MRI of the right lower extremity will be necessary.

## 2022-06-25 ENCOUNTER — Telehealth: Payer: Self-pay

## 2022-06-26 NOTE — Telephone Encounter (Signed)
No further information is needed from me. 

## 2022-06-27 ENCOUNTER — Ambulatory Visit
Admission: EM | Admit: 2022-06-27 | Discharge: 2022-06-27 | Disposition: A | Discharge status: Home / Self Care | Payer: BC Managed Care – PPO | Attending: Emergency Medicine | Admitting: Emergency Medicine

## 2022-06-27 DIAGNOSIS — Z1152 Encounter for screening for COVID-19: Secondary | ICD-10-CM | POA: Insufficient documentation

## 2022-06-27 DIAGNOSIS — J069 Acute upper respiratory infection, unspecified: Secondary | ICD-10-CM | POA: Diagnosis not present

## 2022-06-27 LAB — RAPID INFLUENZA A&B ANTIGENS
Influenza A (ARMC): NEGATIVE
Influenza B (ARMC): NEGATIVE

## 2022-06-27 LAB — GROUP A STREP BY PCR: Group A Strep by PCR: NOT DETECTED

## 2022-06-27 LAB — SARS CORONAVIRUS 2 BY RT PCR: SARS Coronavirus 2 by RT PCR: NEGATIVE

## 2022-06-27 MED ORDER — IPRATROPIUM BROMIDE 0.06 % NA SOLN
2.0000 | Freq: Four times a day (QID) | NASAL | 12 refills | Status: DC
Start: 2022-06-27 — End: 2023-02-24

## 2022-06-27 NOTE — Discharge Instructions (Addendum)
Your test today were negative for COVID, influenza, and strep.  I do believe that you have a viral respiratory infection.  Continue your daily medicines as previously prescribed.  Use the Atrovent nasal spray, 2 squirts up each nostril every 6 hours, as needed for nasal congestion and drainage.  You can use over-the-counter Chloraseptic and/or Sucrets lozenges as needed help soothe your throat.  Salt water gargles can also be very effective.  Mix 1 tablespoon of table salt in 8 ounces of water, gargle and spit.  Use over-the-counter Tylenol and/or ibuprofen according to the package instructions as needed for pain or fever.  If you develop any new or worsening symptoms please return for reevaluation or see your primary care provider.

## 2022-06-27 NOTE — ED Triage Notes (Signed)
Pt c/o sore throat, nasal drainage, headache x1day  Pt is worried about possible strep

## 2022-06-27 NOTE — ED Provider Notes (Signed)
[facility]    CSN: [id number] Arrival date & time: 06/27/22  0807      History   Chief Complaint Chief Complaint  Patient presents with   Sore Throat    HPI Linea A Drilling is a 62 y.o. female.   HPI  62 year old female here for evaluation of sore throat.  Patient reports that her sore throat started about 7:00 last night and she developed headache and clear nasal discharge today.  She denies any fever, ear pain, cough.  She states that several people are sick at work and 1 person she is aware has the flu.  Past Medical History:  Diagnosis Date   Acid reflux    H/O PRIOR TO GASTRC BYPASS   Anemia    H/O   Anxiety    Arthritis    Bladder spasm    Depression    Headache    H/O MIGRAINES   History of kidney stones    Left ureteral stone    Leg DVT (deep venous thromboembolism), acute ([place])    x 2-both times after a surgery-went to see hematologist 2017 (Dr Grayland Ormond)  and no clotting disorder ever found   Restless leg syndrome    Sepsis ([place])    FROM KIDNEY STONES   Sleep apnea    HAD GASTRIC BYPASS AND DOES NOT HAVE SLEEP APNEA   Urge incontinence     Patient Active Problem List   Diagnosis Date Noted   Preoperative evaluation to rule out surgical contraindication 12/02/2021   Pulmonary nodules 12/02/2021   Allergic rhinitis 07/04/2021   Allergic rhinitis due to animal (cat) (dog) hair and dander 07/04/2021   Allergic rhinitis due to pollen 07/04/2021   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/2021   Urticaria 07/04/2021   Acquired pes planus of left foot 07/01/2021   Pain in left foot 07/01/2021   Tendinitis of left foot 07/01/2021   Anxiety 03/30/2019   Depression 03/30/2019   Migraine 03/30/2019   Marginal ulcer 03/24/2017   S/P gastric bypass 03/24/2017   Sepsis ([place]) 11/15/2016   Hydronephrosis 11/15/2016   GERD (gastroesophageal reflux disease) 11/15/2016   UTI (urinary tract infection) 11/15/2016   Right leg DVT ([place]) 01/24/2016    Acute deep vein thrombosis (DVT) of popliteal vein of right lower extremity ([place]) 01/04/2016   Incomplete tear of right rotator cuff 11/22/2015   Moderate obstructive sleep apnea 09/21/2015   Kidney stones 09/10/2015   Urge incontinence 09/10/2015   Nocturia 09/10/2015   Joint pain 09/05/2015   S/P laparoscopic cholecystectomy 09/05/2015   Paralabral cyst of shoulder, right, subsequent encounter 01/10/2015   Rotator cuff tendinitis, right 01/10/2015   Tear of right glenoid labrum 01/10/2015   Acute ankle pain 11/16/2013   Acute shoulder pain 11/16/2013    Past Surgical History:  Procedure Laterality Date   ABDOMINAL HYSTERECTOMY     TOTAL ABDOMINAL HYSTERECTOMY W/ BILATERAL SALPINGOOPHORECTOMY   BARIATRIC SURGERY  05/15/2016   CHOLECYSTECTOMY     ESOPHAGOGASTRODUODENOSCOPY  05/15/2016   KIDNEY STONE SURGERY     KNEE ARTHROSCOPY Right    X 2   KNEE ARTHROSCOPY Right 11/11/2018   Procedure: Partial right knee arthroscopy,arthroscopic medial and lateral menisectomy, plica excision;  Surgeon: Hessie Knows, MD;  Location: [facility];  Service: Orthopedics;  Laterality: Right;   KNEE SURGERY Left 2006   SHOULDER ARTHROSCOPY WITH OPEN ROTATOR CUFF REPAIR Right 11/22/2015   Procedure: SHOULDER ARTHROSCOPY, DEBRIDEMENT, DECOMPRESSION, SLAP REPAIR, POSSIBLE BICEP TENODESIS;  Surgeon: Corky Mull, MD;  Location: [facility];  Service: Orthopedics;  Laterality: Right;   SINUS EXPLORATION      OB History   No obstetric history on file.      Home Medications    Prior to Admission medications   Medication Sig Start Date End Date Taking? Authorizing Provider  ipratropium (ATROVENT) 0.06 % nasal spray Place 2 sprays into both nostrils 4 (four) times daily. 06/27/22  Yes Margarette Canada, NP  levocetirizine (XYZAL) 5 MG tablet Take 1 tablet by mouth every evening.   Yes [provider]  montelukast (SINGULAIR) 10 MG tablet Take 10 mg by mouth at bedtime.   Yes [provider]   triamcinolone (NASACORT) 55 MCG/ACT AERO nasal inhaler 1 spray in each nostril   Yes [provider]  apixaban (ELIQUIS) 5 MG TABS tablet Take 1 tablet (5 mg total) by mouth 2 (two) times daily. 11/11/18 04/15/19  Hessie Knows, MD    Family History Family History  Problem Relation Age of Onset   Cancer Mother        rectal   Lung cancer Father    Hematuria Brother    Prostate cancer Brother    Kidney Stones Brother    Kidney disease Paternal Grandfather    Bladder Cancer Neg Hx    Kidney cancer Neg Hx     Social History Social History   Tobacco Use   Smoking status: Never   Smokeless tobacco: Never  Vaping Use   Vaping Use: Never used  Substance Use Topics   Alcohol use: No    Alcohol/week: 0.0 standard drinks of alcohol   Drug use: No     Allergies   Alcohol, Bacitracin-neomycin-polymyxin, Bacitracin-polymyxin b, Codeine, Hydrocodone, Hydrocortisone, Lanolin, Neomycin, Norepinephrine bitartrate, Other, Oxycodone-acetaminophen, Penicillin g sodium, Tape, Tapentadol, Tramadol, Tramadol hcl, Trimethoprim, Wound dressings, Bacitracin, Clarithromycin, Latex, Nickel, Oxycodone, Penicillins, and Sulfamethoxazole-trimethoprim   Review of Systems Review of Systems  Constitutional:  Negative for fever.  HENT:  Positive for congestion, rhinorrhea and sore throat. Negative for ear pain.   Respiratory:  Negative for cough.      Physical Exam Triage Vital Signs ED Triage Vitals  Enc Vitals Group     BP      Pulse      Resp      Temp      Temp src      SpO2      Weight      Height      Head Circumference      Peak Flow      Pain Score      Pain Loc      Pain Edu?      Excl. in [place]?    No data found.  Updated Vital Signs BP 128/73 (BP Location: Left Arm)   Pulse 67   Temp 98 F (36.7 C) (Oral)   Ht 5' 6"$  (1.676 m)   Wt 209 lb (94.8 kg)   SpO2 99%   BMI 33.73 kg/m   Visual Acuity Right Eye Distance:   Left Eye Distance:   Bilateral Distance:     Right Eye Near:   Left Eye Near:    Bilateral Near:     Physical Exam Vitals and nursing note reviewed.  Constitutional:      Appearance: Normal appearance. She is not ill-appearing.  HENT:     Head: Normocephalic and atraumatic.     Right Ear: Tympanic membrane, ear canal and external ear normal. There is no impacted cerumen.  Left Ear: Tympanic membrane, ear canal and external ear normal. There is no impacted cerumen.     Nose: Congestion and rhinorrhea present.     Comments: Haze Rushing is edematous and mildly erythematous with clear discharge in both nares.    Mouth/Throat:     Mouth: Mucous membranes are moist.     Pharynx: Oropharynx is clear. Posterior oropharyngeal erythema present. No oropharyngeal exudate.     Comments: Patient is erythema in the posterior pharynx with injection and clear postnasal drip.  No exudate appreciated.  Tonsillar pillars are unremarkable. Cardiovascular:     Rate and Rhythm: Normal rate and regular rhythm.     Pulses: Normal pulses.     Heart sounds: Normal heart sounds. No murmur heard.    No friction rub. No gallop.  Pulmonary:     Effort: Pulmonary effort is normal.     Breath sounds: Normal breath sounds. No wheezing, rhonchi or rales.  Musculoskeletal:     Cervical back: Normal range of motion and neck supple.  Lymphadenopathy:     Cervical: No cervical adenopathy.  Skin:    General: Skin is warm and dry.     Capillary Refill: Capillary refill takes less than 2 seconds.     Findings: No rash.  Neurological:     General: No focal deficit present.     Mental Status: She is alert and oriented to person, place, and time.      UC Treatments / Results  Labs (all labs ordered are listed, but only abnormal results are displayed) Labs Reviewed  GROUP A STREP BY PCR  SARS CORONAVIRUS 2 BY RT PCR  RAPID INFLUENZA A&B ANTIGENS    EKG   Radiology No results found.  Procedures Procedures (including critical care time)  Medications  Ordered in UC Medications - No data to display  Initial Impression / Assessment and Plan / UC Course  I have reviewed the triage vital signs and the nursing notes.  Pertinent labs & imaging results that were available during my care of the patient were reviewed by me and considered in my medical decision making (see chart for details).   Patient is a pleasant, nontoxic-appearing 62 year old female with a past medical history significant for anemia, sleep apnea, left leg DVT, and kidney stones presenting for evaluation of 1 days worth of respiratory symptoms as outlined in HPI above.  Patient is concerned about strep given her sore throat but she does have inflammation of her nasal mucous membranes and with clear rhinorrhea and clear postnasal drip.  Given the fact that she has a known flu exposure I will also order a rapid flu as well as a COVID PCR in addition to the strep PCR that was collected at triage and is pending.  Strep PCR is negative.  Influenza engine test is negative.  COVID PCR is negative.  I will discharge patient on the diagnosis of viral URI and provide Atrovent nasal spray to help with the nasal congestion and nasal discharge.  She can use salt water gargles help soothe her throat as well as over-the-counter Chloraseptic or Sucrets lozenges.  Tylenol and/or ibuprofen as needed for pain.  Any new or worsening symptoms she can return for reevaluation.  Work note provided.   Final Clinical Impressions(s) / UC Diagnoses   Final diagnoses:  Viral upper respiratory tract infection     Discharge Instructions      Your test today were negative for COVID, influenza, and strep.  I do believe that  you have a viral respiratory infection.  Continue your daily medicines as previously prescribed.  Use the Atrovent nasal spray, 2 squirts up each nostril every 6 hours, as needed for nasal congestion and drainage.  You can use over-the-counter Chloraseptic and/or Sucrets lozenges  as needed help soothe your throat.  Salt water gargles can also be very effective.  Mix 1 tablespoon of table salt in 8 ounces of water, gargle and spit.  Use over-the-counter Tylenol and/or ibuprofen according to the package instructions as needed for pain or fever.  If you develop any new or worsening symptoms please return for reevaluation or see your primary care provider.     ED Prescriptions     Medication Sig Dispense Auth. Provider   ipratropium (ATROVENT) 0.06 % nasal spray Place 2 sprays into both nostrils 4 (four) times daily. 15 mL Margarette Canada, NP      PDMP not reviewed this encounter.   Margarette Canada, NP 06/27/22 1044

## 2022-07-07 ENCOUNTER — Other Ambulatory Visit: Payer: Self-pay | Admitting: Physician Assistant

## 2022-07-07 ENCOUNTER — Encounter: Payer: Self-pay | Admitting: Physician Assistant

## 2022-07-07 DIAGNOSIS — R29898 Other symptoms and signs involving the musculoskeletal system: Secondary | ICD-10-CM

## 2022-07-07 DIAGNOSIS — R262 Difficulty in walking, not elsewhere classified: Secondary | ICD-10-CM

## 2022-07-07 DIAGNOSIS — R200 Anesthesia of skin: Secondary | ICD-10-CM

## 2022-07-21 ENCOUNTER — Ambulatory Visit
Admission: RE | Admit: 2022-07-21 | Discharge: 2022-07-21 | Disposition: A | Discharge status: Home / Self Care | Payer: BC Managed Care – PPO | Source: Ambulatory Visit | Attending: Physician Assistant | Admitting: Physician Assistant

## 2022-07-21 DIAGNOSIS — R29898 Other symptoms and signs involving the musculoskeletal system: Secondary | ICD-10-CM

## 2022-07-21 DIAGNOSIS — R200 Anesthesia of skin: Secondary | ICD-10-CM

## 2022-07-21 DIAGNOSIS — R262 Difficulty in walking, not elsewhere classified: Secondary | ICD-10-CM

## 2022-08-13 ENCOUNTER — Ambulatory Visit: Payer: BC Managed Care – PPO | Admitting: Neurology

## 2022-10-24 ENCOUNTER — Ambulatory Visit
Admission: EM | Admit: 2022-10-24 | Discharge: 2022-10-24 | Disposition: A | Discharge status: Home / Self Care | Payer: BC Managed Care – PPO | Attending: Nurse Practitioner | Admitting: Nurse Practitioner

## 2022-10-24 DIAGNOSIS — J0110 Acute frontal sinusitis, unspecified: Secondary | ICD-10-CM | POA: Diagnosis not present

## 2022-10-24 MED ORDER — AZITHROMYCIN 250 MG PO TABS: ORAL_TABLET | ORAL | 0 refills | Status: AC

## 2022-10-24 NOTE — Discharge Instructions (Addendum)
You feel like you have a sinus infection related to your history.  You have requested azithromycin 250 mg.  He reports that you have taken this before despite the indication of an allergy for clarithromycin in your medical record.  We encourage conservative treatment with symptom relief, continue with Tylenol sinus We encourage you to use Ibuprofen for fever if not contraindicated. (Remember to use as directed do not exceed daily dosing recommendations). We also encourage salt water gargles for your sore throat. You should also consider throat lozenges and chloraseptic spray. Your cough can be soothed with a cough suppressant.

## 2022-10-24 NOTE — ED Triage Notes (Signed)
Pt c/o headache, cough, green nasal drainage x1week  Pt has tried OTC medication and it is not helping

## 2022-10-24 NOTE — ED Provider Notes (Signed)
[facility]    CSN: [id number] Arrival date & time: 10/24/22  1507      History   Chief Complaint Chief Complaint  Patient presents with   Cough   Nasal Congestion   Headache    HPI Sharon [name] is a 62 y.o. female.   HPI  She is in today complaining of headache, nasal (Tertiary Area of Pain) drainage, sore throat, right ear pain.  History of bilateral rhinitis is currently Singulair 10 mg daily,  Xyzal 5 mg and Nasacort 55 mcg. She denies any fever, chills, shortness of breath or chest pains.  She does not feel her symptoms are bilateral.  She reports that she gets several times per year.  She has also been taking Tylenol Sinus which has been effective for the headache.  She is concerned today because it drainage is changed color. Past Medical History:  Diagnosis Date   Acid reflux    H/O PRIOR TO GASTRC BYPASS   Anemia    H/O   Anxiety    Arthritis    Bladder spasm    Depression    Headache    H/O MIGRAINES   History of kidney stones    Left ureteral stone    Leg DVT (deep venous thromboembolism), acute ([facility])    x 2-both times after a surgery-went to see hematologist 2017 (Dr Orlie Dakin)  and no clotting disorder ever found   Restless leg syndrome    Sepsis ([facility])    FROM KIDNEY STONES   Sleep apnea    HAD GASTRIC BYPASS AND DOES NOT HAVE SLEEP APNEA   Urge incontinence     Patient Active Problem List   Diagnosis Date Noted   Preoperative evaluation to rule out surgical contraindication 12/02/2021   Pulmonary nodules 12/02/2021   Allergic rhinitis 07/04/2021   Allergic rhinitis due to animal (cat) (dog) hair and dander 07/04/2021   Allergic rhinitis due to pollen 07/04/2021   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/2021   Urticaria 07/04/2021   Acquired pes planus of left foot 07/01/2021   Pain in left foot 07/01/2021   Tendinitis of left foot 07/01/2021   Anxiety 03/30/2019   Depression 03/30/2019   Migraine 03/30/2019   Marginal ulcer  03/24/2017   S/P gastric bypass 03/24/2017   Sepsis ([facility]) 11/15/2016   Hydronephrosis 11/15/2016   GERD (gastroesophageal reflux disease) 11/15/2016   UTI (urinary tract infection) 11/15/2016   Right leg DVT ([facility]) 01/24/2016   Acute deep vein thrombosis (DVT) of popliteal vein of right lower extremity ([facility]) 01/04/2016   Incomplete tear of right rotator cuff 11/22/2015   Moderate obstructive sleep apnea 09/21/2015   Kidney stones 09/10/2015   Urge incontinence 09/10/2015   Nocturia 09/10/2015   Joint pain 09/05/2015   S/P laparoscopic cholecystectomy 09/05/2015   Paralabral cyst of shoulder, right, subsequent encounter 01/10/2015   Rotator cuff tendinitis, right 01/10/2015   Tear of right glenoid labrum 01/10/2015   Acute ankle pain 11/16/2013   Acute shoulder pain 11/16/2013    Past Surgical History:  Procedure Laterality Date   ABDOMINAL HYSTERECTOMY     TOTAL ABDOMINAL HYSTERECTOMY W/ BILATERAL SALPINGOOPHORECTOMY   BARIATRIC SURGERY  05/15/2016   CHOLECYSTECTOMY     ESOPHAGOGASTRODUODENOSCOPY  05/15/2016   KIDNEY STONE SURGERY     KNEE ARTHROSCOPY Right    X 2   KNEE ARTHROSCOPY Right 11/11/2018   Procedure: Partial right knee arthroscopy,arthroscopic medial and lateral menisectomy, plica excision;  Surgeon: Kennedy Bucker, MD;  Location: [facility];  Service: Orthopedics;  Laterality: Right;   KNEE SURGERY Left 2006   SHOULDER ARTHROSCOPY WITH OPEN ROTATOR CUFF REPAIR Right 11/22/2015   Procedure: SHOULDER ARTHROSCOPY, DEBRIDEMENT, DECOMPRESSION, SLAP REPAIR, POSSIBLE BICEP TENODESIS;  Surgeon: Christena Flake, MD;  Location: [facility];  Service: Orthopedics;  Laterality: Right;   SINUS EXPLORATION      OB History   No obstetric history on file.      Home Medications    Prior to Admission medications   Medication Sig Start Date End Date Taking? Authorizing Provider  azithromycin (ZITHROMAX Z-PAK) 250 MG tablet Take 2 tablets (500 mg total) by mouth daily for 1 day, THEN 1  tablet (250 mg total) daily for 4 days. 10/24/22 10/29/22 Yes Nova Evett, Shana Chute, NP  ipratropium (ATROVENT) 0.06 % nasal spray Place 2 sprays into both nostrils 4 (four) times daily. 06/27/22  Yes Becky Augusta, NP  levocetirizine (XYZAL) 5 MG tablet Take 1 tablet by mouth every evening.   Yes [provider]  montelukast (SINGULAIR) 10 MG tablet Take 10 mg by mouth at bedtime.   Yes [provider]  triamcinolone (NASACORT) 55 MCG/ACT AERO nasal inhaler 1 spray in each nostril   Yes [provider]  apixaban (ELIQUIS) 5 MG TABS tablet Take 1 tablet (5 mg total) by mouth 2 (two) times daily. 11/11/18 04/15/19  Kennedy Bucker, MD    Family History Family History  Problem Relation Age of Onset   Cancer Mother        rectal   Lung cancer Father    Hematuria Brother    Prostate cancer Brother    Kidney Stones Brother    Kidney disease Paternal Grandfather    Bladder Cancer Neg Hx    Kidney cancer Neg Hx     Social History Social History   Tobacco Use   Smoking status: Never   Smokeless tobacco: Never  Vaping Use   Vaping Use: Never used  Substance Use Topics   Alcohol use: No    Alcohol/week: 0.0 standard drinks of alcohol   Drug use: No     Allergies   Alcohol, Bacitracin-neomycin-polymyxin, Bacitracin-polymyxin b, Codeine, Hydrocodone, Hydrocortisone, Lanolin, Neomycin, Norepinephrine bitartrate, Other, Oxycodone-acetaminophen, Penicillin g sodium, Tape, Tapentadol, Tramadol, Tramadol hcl, Trimethoprim, Wound dressings, Bacitracin, Clarithromycin, Latex, Nickel, Oxycodone, Penicillins, and Sulfamethoxazole-trimethoprim   Review of Systems Review of Systems   Physical Exam Triage Vital Signs ED Triage Vitals  Enc Vitals Group     BP 10/24/22 1519 111/77     Pulse Rate 10/24/22 1519 91     Resp --      Temp 10/24/22 1519 98.3 F (36.8 C)     Temp Source 10/24/22 1519 Oral     SpO2 10/24/22 1519 93 %     Weight 10/24/22 1518 215 lb (97.5 kg)      Height 10/24/22 1518 5\' 6"  (1.676 m)     Head Circumference --      Peak Flow --      Pain Score 10/24/22 1518 5     Pain Loc --      Pain Edu? --      Excl. in [place]? --    No data found.  Updated Vital Signs BP 111/77 (BP Location: Left Arm)   Pulse 91   Temp 98.3 F (36.8 C) (Oral)   Ht 5\' 6"  (1.676 m)   Wt 215 lb (97.5 kg)   SpO2 93%   BMI 34.70 kg/m   Visual Acuity Right Eye  Distance:   Left Eye Distance:   Bilateral Distance:    Right Eye Near:   Left Eye Near:    Bilateral Near:     Physical Exam Constitutional:      Appearance: She is obese.  HENT:     Head: Normocephalic.     Right Ear: Tympanic membrane normal.     Left Ear: Tympanic membrane normal.     Nose: Nose normal.     Mouth/Throat:     Mouth: Mucous membranes are moist.     Pharynx: No oropharyngeal exudate or posterior oropharyngeal erythema.  Cardiovascular:     Rate and Rhythm: Normal rate.  Pulmonary:     Effort: Pulmonary effort is normal. No respiratory distress.     Breath sounds: Decreased air movement present. No wheezing.     Comments: Barrel chest  Musculoskeletal:        General: Normal range of motion.     Cervical back: Normal range of motion.  Skin:    General: Skin is warm and dry.     Capillary Refill: Capillary refill takes less than 2 seconds.     Coloration: Skin is pale.  Neurological:     General: No focal deficit present.     Mental Status: She is alert and oriented to person, place, and time.  Psychiatric:        Mood and Affect: Mood normal.        Behavior: Behavior normal.      UC Treatments / Results  Labs (all labs ordered are listed, but only abnormal results are displayed) Labs Reviewed - No data to display  EKG   Radiology No results found.  Procedures Procedures (including critical care time)  Medications Ordered in UC Medications - No data to display  Initial Impression / Assessment and Plan / UC Course  I have reviewed the triage vital  signs and the nursing notes.  Pertinent labs & imaging results that were available during my care of the patient were reviewed by me and considered in my medical decision making (see chart for details).     cough Final Clinical Impressions(s) / UC Diagnoses   Final diagnoses:  Acute frontal sinusitis, recurrence not specified     Discharge Instructions      You feel like you have a sinus infection related to your history.  You have requested azithromycin 250 mg.  He reports that you have taken this before despite the indication of an allergy for clarithromycin in your medical record.  We encourage conservative treatment with symptom relief, continue with Tylenol sinus We encourage you to use Ibuprofen for fever if not contraindicated. (Remember to use as directed do not exceed daily dosing recommendations). We also encourage salt water gargles for your sore throat. You should also consider throat lozenges and chloraseptic spray. Your cough can be soothed with a cough suppressant.        ED Prescriptions     Medication Sig Dispense Auth. Provider   azithromycin (ZITHROMAX Z-PAK) 250 MG tablet Take 2 tablets (500 mg total) by mouth daily for 1 day, THEN 1 tablet (250 mg total) daily for 4 days. 6 tablet Barbette Merino, NP      PDMP not reviewed this encounter.   Thad Ranger [place], NP 10/24/22 1556

## 2023-01-26 ENCOUNTER — Ambulatory Visit: Payer: BC Managed Care – PPO | Admitting: Podiatry

## 2023-01-26 ENCOUNTER — Encounter: Payer: Self-pay | Admitting: Podiatry

## 2023-01-26 DIAGNOSIS — M19071 Primary osteoarthritis, right ankle and foot: Secondary | ICD-10-CM | POA: Diagnosis not present

## 2023-01-26 DIAGNOSIS — G5781 Other specified mononeuropathies of right lower limb: Secondary | ICD-10-CM

## 2023-01-26 DIAGNOSIS — T148XXA Other injury of unspecified body region, initial encounter: Secondary | ICD-10-CM | POA: Diagnosis not present

## 2023-01-26 NOTE — Progress Notes (Signed)
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  She states that it is really starting to bother him he is wondering if we could do another shot today.  She is referring to the pain around the first second and third metatarsophalangeal joints.  She states that the neuroma is still giving her grief as well.  Objective: Vital signs are stable alert and oriented x 3 severe pain on palpation of the second third and fourth metatarsal phalangeal joints as well as the hallux.  There is some angular deviation associated with these toes with some separation.  She also still has a palpable Mulder's click to the third interdigital space of the right foot consistent with neuroma.  Assessment: I am concerned about a tear to the second metatarsophalangeal joint and hallux valgus deformity of the right foot with possible osteoarthritic changes and possible tear to the plantar plate.  Also I would like to make sure that this is a neuroma to the third interdigital space prior to surgical intervention.  Plan: Discussed etiology pathology and surgical therapies requesting MRI of the forefoot right.  This is for differential diagnosis and surgical planning.

## 2023-02-03 ENCOUNTER — Telehealth: Payer: Self-pay | Admitting: Podiatry

## 2023-02-03 NOTE — Telephone Encounter (Signed)
Cannot get MRI until 9/28.  Is there a medication she can take until the MRI.  Cannot take any nsaids.  Please call.

## 2023-02-11 ENCOUNTER — Encounter: Payer: Self-pay | Admitting: Podiatry

## 2023-02-14 ENCOUNTER — Inpatient Hospital Stay
Admission: RE | Admit: 2023-02-14 | Discharge: 2023-02-14 | Disposition: A | Discharge status: Home / Self Care | Payer: BC Managed Care – PPO | Source: Ambulatory Visit | Attending: Podiatry | Admitting: Podiatry

## 2023-02-14 DIAGNOSIS — T148XXA Other injury of unspecified body region, initial encounter: Secondary | ICD-10-CM

## 2023-02-14 DIAGNOSIS — G5781 Other specified mononeuropathies of right lower limb: Secondary | ICD-10-CM

## 2023-02-14 DIAGNOSIS — M19071 Primary osteoarthritis, right ankle and foot: Secondary | ICD-10-CM

## 2023-02-18 ENCOUNTER — Ambulatory Visit: Payer: BC Managed Care – PPO | Admitting: Podiatry

## 2023-02-24 ENCOUNTER — Ambulatory Visit
Admission: EM | Admit: 2023-02-24 | Discharge: 2023-02-24 | Disposition: A | Discharge status: Home / Self Care | Payer: BC Managed Care – PPO | Attending: Emergency Medicine | Admitting: Emergency Medicine

## 2023-02-24 DIAGNOSIS — J029 Acute pharyngitis, unspecified: Secondary | ICD-10-CM | POA: Insufficient documentation

## 2023-02-24 DIAGNOSIS — J0140 Acute pansinusitis, unspecified: Secondary | ICD-10-CM | POA: Diagnosis not present

## 2023-02-24 LAB — GROUP A STREP BY PCR: Group A Strep by PCR: NOT DETECTED

## 2023-02-24 MED ORDER — AMOXICILLIN-POT CLAVULANATE 875-125 MG PO TABS
1.0000 | ORAL_TABLET | Freq: Two times a day (BID) | ORAL | 0 refills | Status: DC
Start: 2023-02-24 — End: 2023-03-16

## 2023-02-24 NOTE — ED Provider Notes (Signed)
HPI  SUBJECTIVE: Patient presents with 4 days of nasal congestion, rhinorrhea that started off as clear, has now become green, cough productive of green phlegm, sinus pain and pressure, sore throat.  No fevers, cough, facial swelling, upper dental pain.  No body aches, headaches, postnasal drip, wheezing, shortness of breath, nausea, vomiting, diarrhea, abdominal pain.  No antibiotics in the past month.  No antipyretic in the past 6 hours.  She has been taking Tylenol sinus, cough lozenges, Nasacort, and still taking her allergy medication.  The Tylenol sinus and Nasacort helped.  No aggravating factors.  No allergy or GERD symptoms.  No known COVID, flu exposure.  She had 2 doses of the COVID-vaccine.  She did not get this years flu vaccine.  Patient has a past medical history of postsurgical DVT x 2, UTI, nephrolithiasis, urosepsis, is status post gastric bypass, OSA, allergic rhinitis PCP: Her OB/GYN.  Past Medical History:  Diagnosis Date   Acid reflux    H/O PRIOR TO GASTRC BYPASS   Anemia    H/O   Anxiety    Arthritis    Bladder spasm    Depression    Headache    H/O MIGRAINES   History of kidney stones    Left ureteral stone    Leg DVT (deep venous thromboembolism), acute (HCC)    x 2-both times after a surgery-went to see hematologist 2017 (Dr Orlie Dakin)  and no clotting disorder ever found   Restless leg syndrome    Sepsis (HCC)    FROM KIDNEY STONES   Sleep apnea    HAD GASTRIC BYPASS AND DOES NOT HAVE SLEEP APNEA   Urge incontinence     Past Surgical History:  Procedure Laterality Date   ABDOMINAL HYSTERECTOMY     TOTAL ABDOMINAL HYSTERECTOMY W/ BILATERAL SALPINGOOPHORECTOMY   BARIATRIC SURGERY  05/15/2016   CHOLECYSTECTOMY     ESOPHAGOGASTRODUODENOSCOPY  05/15/2016   KIDNEY STONE SURGERY     KNEE ARTHROSCOPY Right    X 2   KNEE ARTHROSCOPY Right 11/11/2018   Procedure: Partial right knee arthroscopy,arthroscopic medial and lateral menisectomy, plica excision;   Surgeon: Kennedy Bucker, MD;  Location: [facility];  Service: Orthopedics;  Laterality: Right;   KNEE SURGERY Left 2006   SHOULDER ARTHROSCOPY WITH OPEN ROTATOR CUFF REPAIR Right 11/22/2015   Procedure: SHOULDER ARTHROSCOPY, DEBRIDEMENT, DECOMPRESSION, SLAP REPAIR, POSSIBLE BICEP TENODESIS;  Surgeon: Christena Flake, MD;  Location: [facility];  Service: Orthopedics;  Laterality: Right;   SINUS EXPLORATION      Family History  Problem Relation Age of Onset   Cancer Mother        rectal   Lung cancer Father    Hematuria Brother    Prostate cancer Brother    Kidney Stones Brother    Kidney disease Paternal Grandfather    Bladder Cancer Neg Hx    Kidney cancer Neg Hx     Social History   Tobacco Use   Smoking status: Never   Smokeless tobacco: Never  Vaping Use   Vaping status: Never Used  Substance Use Topics   Alcohol use: No    Alcohol/week: 0.0 standard drinks of alcohol   Drug use: No    No current facility-administered medications for this encounter.  Current Outpatient Medications:    amoxicillin-clavulanate (AUGMENTIN) 875-125 MG tablet, Take 1 tablet by mouth every 12 (twelve) hours., Disp: 14 tablet, Rfl: 0   montelukast (SINGULAIR) 10 MG tablet, Take 10 mg by mouth at bedtime., Disp: , Rfl:  triamcinolone (NASACORT) 55 MCG/ACT AERO nasal inhaler, 1 spray in each nostril, Disp: , Rfl:   Allergies  Allergen Reactions   Alcohol Other (See Comments)    wool   Bacitracin-Neomycin-Polymyxin Other (See Comments)   Bacitracin-Polymyxin B     Other reaction(s): rash   Codeine Nausea Only and Other (See Comments)    "headache" Other reaction(s): rash   Hydrocodone Other (See Comments)    Other reaction(s): rash   Hydrocortisone Other (See Comments)   Lanolin Other (See Comments)    "rash" Other reaction(s): Unknown   Neomycin    Norepinephrine Bitartrate    Other Other (See Comments)    Bitartrate/black rubber dye/elastic Other reaction(s): Unknown    Oxycodone-Acetaminophen     Other reaction(s): rash   Penicillin G Sodium     Other reaction(s): rash   Tape Other (See Comments)    Other reaction(s): Unknown-OK TO USE PAPER TAPE   Tapentadol Other (See Comments)   Tramadol Other (See Comments)    "headache"   Tramadol Hcl     Other reaction(s): headaches   Trimethoprim    Wound Dressings     Other reaction(s): rash   Bacitracin Rash   Clarithromycin Rash   Latex Rash    Other reaction(s): rash Other reaction(s): rash   Nickel Rash and Other (See Comments)   Oxycodone Rash   Penicillins Rash    Has patient had a PCN reaction causing immediate rash, facial/tongue/throat swelling, SOB or lightheadedness with hypotension: yes Has patient had a PCN reaction causing severe rash involving mucus membranes or skin necrosis: no Has patient had a PCN reaction that required hospitalization No Has patient had a PCN reaction occurring within the last 10 years: No If all of the above answers are "NO", then may proceed with Cephalosporin use.    Sulfamethoxazole-Trimethoprim Rash     ROS  As noted in HPI.   Physical Exam  BP 114/70 (BP Location: Right Arm)   Pulse 60   Temp 97.9 F (36.6 C) (Oral)   Resp 16   Ht 5' 6.5" (1.689 m)   Wt 98 kg   SpO2 97%   BMI 34.34 kg/m   Constitutional: Well developed, well nourished, no acute distress Eyes:  EOMI, conjunctiva normal bilaterally HENT: Normocephalic, atraumatic,mucus membranes moist.  Positive purulent nasal congestion.  Normal turbinates.  Positive maxillary, frontal sinus tenderness.  No facial swelling erythematous oropharynx.  Tonsils normal size without exudate.  Uvula midline. Respiratory: Normal inspiratory effort clear bilaterally Cardiovascular: Normal rate, no murmurs, rubs, gallops GI: nondistended skin: No rash, skin intact Lymph: No anterior cervical LN.  No posterior cervical lymphadenopathy Musculoskeletal: no deformities Neurologic: Alert & oriented x 3, no  focal neuro deficits Psychiatric: Speech and behavior appropriate.   ED Course   Medications - No data to display  Orders Placed This Encounter  Procedures   Group A Strep by PCR    Standing Status:   Standing    Number of Occurrences:   1    Results for orders placed or performed during the hospital encounter of 02/24/23 (from the past 24 hour(s))  Group A Strep by PCR     Status: None   Collection Time: 02/24/23 11:49 AM   Specimen: Throat; Sterile Swab  Result Value Ref Range   Group A Strep by PCR NOT DETECTED NOT DETECTED   No results found.  ED Clinical Impression  1. Acute non-recurrent pansinusitis   2. Sore throat  ED Assessment/Plan    Strep PCR negative.  Patient presents with a URI, acute pansinusitis, sore throat.  She declined COVID testing.  However, she does not meet ISDA criteria for antibiotics for sinusitis quite yet.  Will have her continue the Nasacort, saline nasal irrigation, discontinue allergy medication as it is no longer working for her, start Mucinex D.  Benadryl/Maalox mixture, Tylenol 1000 mg 3 times daily.  Wait-and-see prescription of Augmentin for sinusitis.  She states that she has been on Augmentin before without any problems.  Went over indications for starting this.  [phone]  Follow-up with PCP or may return here as needed.  Discussed labs,  MDM, plan and followup with patient. patient agrees with plan.   Meds ordered this encounter  Medications   amoxicillin-clavulanate (AUGMENTIN) 875-125 MG tablet    Sig: Take 1 tablet by mouth every 12 (twelve) hours.    Dispense:  14 tablet    Refill:  0     *This clinic note was created using Scientist, clinical (histocompatibility and immunogenetics). Therefore, there may be occasional mistakes despite careful proofreading.     Domenick Gong, MD 02/24/23 1222

## 2023-02-24 NOTE — ED Triage Notes (Signed)
Pt c/o sore throat & drainage x4 days. Denies any fevers. Has tried tylenol w/o relief.

## 2023-02-24 NOTE — Discharge Instructions (Signed)
Start Mucinex-D to keep the mucous thin and to decongest you.  Stop Tylenol sinus and the allergy medication.  Continue Nasacort.  You may take 1000 mg of tylenol up to 3-4 times a day as needed for pain.  Most sinus infections are viral and do not need antibiotics unless you have a high fever, have had this for 10 days, or you get better and then get sick again. Use a NeilMed sinus rinse with distilled water as often as you want to to reduce nasal congestion. Follow the directions on the box.   I have given you a wait-and-see prescription of Augmentin.  I would wait several days to start this and give the Nasacort, Mucinex D, saline nasal irrigation a try first.   We will contact you if and only if your strep is positive and I will extend your Augmentin out to 10 days.  Some people find salt water gargles and  Traditional Medicinal's "Throat Coat" tea helpful. Take 5 mL of liquid Benadryl and 5 mL of Maalox. Mix it together, and then hold it in your mouth for as long as you can and then swallow. You may do this 4 times a day.  Honey and lemon dissolved in hot water can also be soothing.  Go to www.goodrx.com to look up your medications. This will give you a list of where you can find your prescriptions at the most affordable prices. Or you can ask the pharmacist what the cash price is. This is frequently cheaper than going through insurance.

## 2023-03-04 ENCOUNTER — Ambulatory Visit: Payer: BC Managed Care – PPO | Admitting: Podiatry

## 2023-03-16 ENCOUNTER — Ambulatory Visit
Admission: EM | Admit: 2023-03-16 | Discharge: 2023-03-16 | Disposition: A | Discharge status: Home / Self Care | Payer: BC Managed Care – PPO | Attending: Family Medicine | Admitting: Family Medicine

## 2023-03-16 DIAGNOSIS — N3001 Acute cystitis with hematuria: Secondary | ICD-10-CM | POA: Diagnosis present

## 2023-03-16 DIAGNOSIS — R739 Hyperglycemia, unspecified: Secondary | ICD-10-CM | POA: Diagnosis present

## 2023-03-16 LAB — URINALYSIS, W/ REFLEX TO CULTURE (INFECTION SUSPECTED)
Glucose, UA: NEGATIVE mg/dL
Ketones, ur: NEGATIVE mg/dL
Nitrite: NEGATIVE
Protein, ur: 30 mg/dL — AB
Specific Gravity, Urine: >1.03 — ABNORMAL HIGH (ref 1.005–1.030)
pH: 5.5 (ref 5.0–8.0)

## 2023-03-16 MED ORDER — NITROFURANTOIN MONOHYD MACRO 100 MG PO CAPS: 100.0000 mg | ORAL_CAPSULE | Freq: Two times a day (BID) | ORAL | 0 refills | Status: AC

## 2023-03-16 NOTE — ED Provider Notes (Signed)
[facility]    CSN: [id number] Arrival date & time: 03/16/23  1755      History   Chief Complaint Chief Complaint  Patient presents with   Urinary Frequency     HPI HPI Kiran A Hildenbrand is a 62 y.o. female.    Izetta A Vanrossum presents for dysuria, urinary frequency, urinary pressure that started on Sunday.  Reports similar symptoms in the past with urinary tract infections.      Past Medical History:  Diagnosis Date   Acid reflux    H/O PRIOR TO GASTRC BYPASS   Anemia    H/O   Anxiety    Arthritis    Bladder spasm    Depression    Headache    H/O MIGRAINES   History of kidney stones    Left ureteral stone    Leg DVT (deep venous thromboembolism), acute (HCC)    x 2-both times after a surgery-went to see hematologist 2017 (Dr Orlie Dakin)  and no clotting disorder ever found   Restless leg syndrome    Sepsis (HCC)    FROM KIDNEY STONES   Sleep apnea    HAD GASTRIC BYPASS AND DOES NOT HAVE SLEEP APNEA   Urge incontinence     Patient Active Problem List   Diagnosis Date Noted   Preoperative evaluation to rule out surgical contraindication 12/02/2021   Pulmonary nodules 12/02/2021   Allergic rhinitis 07/04/2021   Allergic rhinitis due to animal (cat) (dog) hair and dander 07/04/2021   Allergic rhinitis due to pollen 07/04/2021   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/2021   Urticaria 07/04/2021   Acquired pes planus of left foot 07/01/2021   Pain in left foot 07/01/2021   Tendinitis of left foot 07/01/2021   Anxiety 03/30/2019   Depression 03/30/2019   Migraine 03/30/2019   Marginal ulcer 03/24/2017   S/P gastric bypass 03/24/2017   Sepsis (HCC) 11/15/2016   Hydronephrosis 11/15/2016   GERD (gastroesophageal reflux disease) 11/15/2016   UTI (urinary tract infection) 11/15/2016   Right leg DVT (HCC) 01/24/2016   Acute deep vein thrombosis (DVT) of popliteal vein of right lower extremity (HCC) 01/04/2016   Incomplete tear of right  rotator cuff 11/22/2015   Moderate obstructive sleep apnea 09/21/2015   Kidney stones 09/10/2015   Urge incontinence 09/10/2015   Nocturia 09/10/2015   Joint pain 09/05/2015   S/P laparoscopic cholecystectomy 09/05/2015   Paralabral cyst of shoulder, right, subsequent encounter 01/10/2015   Rotator cuff tendinitis, right 01/10/2015   Tear of right glenoid labrum 01/10/2015   Acute ankle pain 11/16/2013   Acute shoulder pain 11/16/2013    Past Surgical History:  Procedure Laterality Date   ABDOMINAL HYSTERECTOMY     TOTAL ABDOMINAL HYSTERECTOMY W/ BILATERAL SALPINGOOPHORECTOMY   BARIATRIC SURGERY  05/15/2016   CHOLECYSTECTOMY     ESOPHAGOGASTRODUODENOSCOPY  05/15/2016   KIDNEY STONE SURGERY     KNEE ARTHROSCOPY Right    X 2   KNEE ARTHROSCOPY Right 11/11/2018   Procedure: Partial right knee arthroscopy,arthroscopic medial and lateral menisectomy, plica excision;  Surgeon: Kennedy Bucker, MD;  Location: [facility];  Service: Orthopedics;  Laterality: Right;   KNEE SURGERY Left 2006   SHOULDER ARTHROSCOPY WITH OPEN ROTATOR CUFF REPAIR Right 11/22/2015   Procedure: SHOULDER ARTHROSCOPY, DEBRIDEMENT, DECOMPRESSION, SLAP REPAIR, POSSIBLE BICEP TENODESIS;  Surgeon: Christena Flake, MD;  Location: [facility];  Service: Orthopedics;  Laterality: Right;   SINUS EXPLORATION      OB History   No obstetric history on  file.      Home Medications    Prior to Admission medications   Medication Sig Start Date End Date Taking? Authorizing Provider  levocetirizine (XYZAL) 5 MG tablet SMARTSIG:1 Tablet(s) By Mouth Every Evening 03/04/23  Yes [provider]  montelukast (SINGULAIR) 10 MG tablet Take 10 mg by mouth at bedtime.   Yes [provider]  nitrofurantoin, macrocrystal-monohydrate, (MACROBID) 100 MG capsule Take 1 capsule (100 mg total) by mouth 2 (two) times daily for 7 days. 03/16/23 03/23/23 Yes Clayson Riling, DO  triamcinolone (NASACORT) 55 MCG/ACT AERO nasal inhaler 1  spray in each nostril    [provider]  apixaban (ELIQUIS) 5 MG TABS tablet Take 1 tablet (5 mg total) by mouth 2 (two) times daily. 11/11/18 04/15/19  Kennedy Bucker, MD    Family History Family History  Problem Relation Age of Onset   Cancer Mother        rectal   Lung cancer Father    Hematuria Brother    Prostate cancer Brother    Kidney Stones Brother    Kidney disease Paternal Grandfather    Bladder Cancer Neg Hx    Kidney cancer Neg Hx     Social History Social History   Tobacco Use   Smoking status: Never   Smokeless tobacco: Never  Vaping Use   Vaping status: Never Used  Substance Use Topics   Alcohol use: No    Alcohol/week: 0.0 standard drinks of alcohol   Drug use: No     Allergies   Alcohol, Augmentin [amoxicillin-pot clavulanate], Bacitracin-neomycin-polymyxin, Bacitracin-polymyxin b, Codeine, Hydrocodone, Hydrocortisone, Lanolin, Neomycin, Norepinephrine bitartrate, Other, Oxycodone-acetaminophen, Penicillin g sodium, Tape, Tapentadol, Tramadol, Tramadol hcl, Trimethoprim, Wound dressings, Bacitracin, Clarithromycin, Latex, Nickel, Oxycodone, Penicillins, and Sulfamethoxazole-trimethoprim   Review of Systems Review of Systems: :negative unless otherwise stated in HPI.      Physical Exam Triage Vital Signs ED Triage Vitals  Encounter Vitals Group     BP 03/16/23 1841 121/72     Systolic BP Percentile --      Diastolic BP Percentile --      Pulse Rate 03/16/23 1841 74     Resp 03/16/23 1841 17     Temp 03/16/23 1841 97.9 F (36.6 C)     Temp Source 03/16/23 1841 Oral     SpO2 03/16/23 1841 100 %     Weight --      Height --      Head Circumference --      Peak Flow --      Pain Score 03/16/23 1839 3     Pain Loc --      Pain Education --      Exclude from Growth Chart --    No data found.  Updated Vital Signs BP 121/72 (BP Location: Left Arm)   Pulse 74   Temp 97.9 F (36.6 C) (Oral)   Resp 17   SpO2 100%   Visual  Acuity Right Eye Distance:   Left Eye Distance:   Bilateral Distance:    Right Eye Near:   Left Eye Near:    Bilateral Near:     Physical Exam GEN: well appearing female in no acute distress  CVS: well perfused  RESP: speaking in full sentences without pause      UC Treatments / Results  Labs (all labs ordered are listed, but only abnormal results are displayed) Labs Reviewed  URINE CULTURE - Abnormal; Notable for the following components:  Result Value   Culture 60,000 COLONIES/mL ESCHERICHIA COLI (*)    Organism ID, Bacteria ESCHERICHIA COLI (*)    All other components within normal limits  URINALYSIS, W/ REFLEX TO CULTURE (INFECTION SUSPECTED) - Abnormal; Notable for the following components:   Specific Gravity, Urine >1.030 (*)    Hgb urine dipstick SMALL (*)    Bilirubin Urine SMALL (*)    Protein, ur 30 (*)    Leukocytes,Ua TRACE (*)    Bacteria, UA MANY (*)    All other components within normal limits    EKG   Radiology No results found.  Procedures Procedures (including critical care time)  Medications Ordered in UC Medications - No data to display  Initial Impression / Assessment and Plan / UC Course  I have reviewed the triage vital signs and the nursing notes.  Pertinent labs & imaging results that were available during my care of the patient were reviewed by me and considered in my medical decision making (see chart for details).     Acute cystitis:  Patient is a 62 y.o. female  who presents for dysuria and urinary frequency.  Overall patient is well-appearing and afebrile.  Vital signs stable.  UA consistent with acute cystitis.  Hematuria supported on microscopy. Treat with Macrobid 2 times daily for 7 days. Urine culture obtained.  Follow-up sensitivities and change antibiotics, if needed.   Return precautions including abdominal pain, fever, chills, nausea, or vomiting given. Follow-up,  if symptoms not improving or getting worse.  Discussed MDM, treatment plan and plan for follow-up with patient who agrees with plan.        Final Clinical Impressions(s) / UC Diagnoses   Final diagnoses:  Acute cystitis with hematuria  Hyperglycemia     Discharge Instructions      Stop by the pharmacy to pick up your prescriptions.  Follow up with your primary care provider as needed.      ED Prescriptions     Medication Sig Dispense Auth. Provider   nitrofurantoin, macrocrystal-monohydrate, (MACROBID) 100 MG capsule Take 1 capsule (100 mg total) by mouth 2 (two) times daily for 7 days. 14 capsule Lavera Vandermeer, DO      PDMP not reviewed this encounter.   Katha Cabal, DO 03/20/23 1138

## 2023-03-16 NOTE — ED Triage Notes (Signed)
Sx x Sunday.   Urinary frequency- pressure.

## 2023-03-16 NOTE — Discharge Instructions (Addendum)
Stop by the pharmacy to pick up your prescriptions.  Follow up with your primary care provider as needed.  

## 2023-03-18 ENCOUNTER — Ambulatory Visit: Payer: BC Managed Care – PPO | Admitting: Podiatry

## 2023-03-18 ENCOUNTER — Encounter: Payer: Self-pay | Admitting: Podiatry

## 2023-03-18 DIAGNOSIS — M25872 Other specified joint disorders, left ankle and foot: Secondary | ICD-10-CM | POA: Diagnosis not present

## 2023-03-18 DIAGNOSIS — G5781 Other specified mononeuropathies of right lower limb: Secondary | ICD-10-CM

## 2023-03-18 DIAGNOSIS — M205X2 Other deformities of toe(s) (acquired), left foot: Secondary | ICD-10-CM | POA: Diagnosis not present

## 2023-03-18 LAB — URINE CULTURE: Culture: 60000 — AB

## 2023-03-18 NOTE — Progress Notes (Signed)
She presents today for follow-up of her worsening right foot pain.  She states that she is hurting so bad is affecting her ability to perform her daily activities and is starting to affect her mental status as well.  She states that she feels like she is depressed and is unable to work her way through this.  She is afraid to contact I told her general health.  She denies any change in her past medical history medications or allergies.  Objective: Vital signs stable alert and oriented x 3.  Severe pain on palpation tibial sesamoid and end range of motion of the first metatarsophalangeal joint with hallux limitus.  She also has pain to the third interdigital space with a palpable Mulder's click.  Pulses are palpable neurologic sensorium is intact deep tendon reflexes are intact muscle strength is normal and symmetrical.  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  Plan: Discussed etiology pathology conservative versus surgical therapies.  At this point I consented her for a Keller arthroplasty with a single silicone implant, sesamoidectomy tibial and a neurectomy third interdigital space on the right foot.  We discussed the pros and cons of the surgery the possible side effects which may include but are not limited to postop pain bleeding swell infection recurrence need further surgery overcorrection under correction also digit loss limb loss of life.  Discussed the surgery center with her.  Discussed the need for surgery with her and the possible complications associated with that she understands is amenable to it we will follow-up with me in the near future for surgical intervention.

## 2023-03-20 ENCOUNTER — Telehealth: Payer: Self-pay | Admitting: Podiatry

## 2023-03-20 NOTE — Telephone Encounter (Signed)
DOS-03/27/2023  Sharon [name] IMPLANT [id number] SESAMOIDECTOMY [id number] NEURECTOMY 3RD [id number]  [employer] EFFECTIVE DATE- 05/19/2022  DEDUCTIBLE- $1400.00 WITH REMAINING $0.00 OOP- $3800.00 WITH REMAINING $1430.95 COINSURANCE- 20%  SPOKE WITH [name] FROM [employer] AND SHE STATED THAT PRIOR AUTH IS NOT REQUIRED FOR CPT CODES [phone] AND [id number].  CALL REFERENCE NUMBER: [name] 03/20/2023 @ 10:34AM EST

## 2023-03-25 ENCOUNTER — Other Ambulatory Visit: Payer: Self-pay | Admitting: Podiatry

## 2023-03-25 MED ORDER — ONDANSETRON HCL 4 MG PO TABS: 4.0000 mg | ORAL_TABLET | Freq: Three times a day (TID) | ORAL | 0 refills | Status: DC | PRN

## 2023-03-25 MED ORDER — DOXYCYCLINE HYCLATE 100 MG PO TABS: 100.0000 mg | ORAL_TABLET | Freq: Two times a day (BID) | ORAL | 0 refills | Status: DC

## 2023-03-25 MED ORDER — MORPHINE SULFATE 15 MG PO TABS: 15.0000 mg | ORAL_TABLET | ORAL | 0 refills | Status: AC | PRN

## 2023-03-27 DIAGNOSIS — G5761 Lesion of plantar nerve, right lower limb: Secondary | ICD-10-CM | POA: Diagnosis not present

## 2023-03-27 DIAGNOSIS — M25871 Other specified joint disorders, right ankle and foot: Secondary | ICD-10-CM | POA: Diagnosis not present

## 2023-03-27 DIAGNOSIS — M205X1 Other deformities of toe(s) (acquired), right foot: Secondary | ICD-10-CM | POA: Diagnosis not present

## 2023-03-30 ENCOUNTER — Telehealth: Payer: Self-pay

## 2023-03-30 ENCOUNTER — Telehealth: Payer: Self-pay | Admitting: Podiatry

## 2023-03-30 NOTE — Telephone Encounter (Signed)
If she returns call, should continue to ice and elevate, can loosen ACE wrap as needed and undo boot briefly to see if this helps

## 2023-03-30 NOTE — Telephone Encounter (Signed)
Pt left message at 1258pm today stating the surgery center told her to call us. Pain has not eased since having surgery. Please advise?

## 2023-03-30 NOTE — Telephone Encounter (Signed)
Received a message that patient had called, reporting that she had a terrible weekend due to pain after surgery on Friday. Attempted to call patient, but she did not answer and I could not leave a message. She was given morphine 15 mg # 30 1 Q 4 hours PRN severe pain on Friday. FYI

## 2023-04-01 ENCOUNTER — Encounter: Payer: Self-pay | Admitting: Podiatry

## 2023-04-01 ENCOUNTER — Ambulatory Visit (INDEPENDENT_AMBULATORY_CARE_PROVIDER_SITE_OTHER): Payer: BC Managed Care – PPO

## 2023-04-01 ENCOUNTER — Ambulatory Visit (INDEPENDENT_AMBULATORY_CARE_PROVIDER_SITE_OTHER): Payer: BC Managed Care – PPO | Admitting: Podiatry

## 2023-04-01 DIAGNOSIS — M205X1 Other deformities of toe(s) (acquired), right foot: Secondary | ICD-10-CM

## 2023-04-01 DIAGNOSIS — M25871 Other specified joint disorders, right ankle and foot: Secondary | ICD-10-CM | POA: Diagnosis not present

## 2023-04-01 DIAGNOSIS — G5781 Other specified mononeuropathies of right lower limb: Secondary | ICD-10-CM

## 2023-04-01 DIAGNOSIS — Z9889 Other specified postprocedural states: Secondary | ICD-10-CM

## 2023-04-01 NOTE — Progress Notes (Signed)
She presents today for her first postop visit date of surgery March 27, 2023.  She had a right foot Keller bunionectomy with silicone implant tibial sesamoidectomy and a neurectomy to the third interdigital space of the right foot.  She states that has been real sore and she has been taking a lot of pain medicine she states that I believe I have 5 pills left.  Objective: Vitals are stable she is alert and oriented x 3.  Pulses are palpable.  Presents with her cam boot on once removed demonstrates moderate edema no erythema to the dorsum of the foot however some to the medial aspect of the first metatarsophalangeal joint.  She has good range of motion passively first metatarsal phalangeal joint.  Good alignment of the toes.  Assessment: Well-healing surgical foot.  Plan: Redressed today dressed a compressive dressing follow-up with me in 1 week she will continue attempted range of motion's of the first metatarsophalangeal joint

## 2023-04-02 ENCOUNTER — Telehealth: Payer: Self-pay | Admitting: Podiatry

## 2023-04-02 NOTE — Telephone Encounter (Signed)
Received and completed the STD paperwork from [facility] ......   Faxed to [employer] @ [phone] .Marland Kitchen...     [name] -- 04/02/2023

## 2023-04-02 NOTE — Telephone Encounter (Signed)
s/w Terrence Dupont @ [phone] -- verified receipt of FMLA paperwork from [facility] HR department.  Patient verified DOS and provided fax# where to send completed FMLA docs ....   [phone] -- attn:  Concepcion Living ....      FMLA paperwork was faxed to HR today ....      Abbott -- 04/02/2023

## 2023-04-06 ENCOUNTER — Telehealth: Payer: Self-pay

## 2023-04-06 NOTE — Telephone Encounter (Signed)
Patient called, concerned that she is short of breath when she is up moving around. She has had DVT in the past after shoulder surgery. After discussing with Dr. Al Corpus, I advised patient that if her shortness of breath continues to worsen, she will need to proceed to the ED. We discussed the effects of anesthesia and high potency pain medications. She feels that her SOB is because she is moving around more and more, and she is just "out of shape". Advised to continue to monitor and if her breathing worsens or she develops any other concerning symptoms, she will proceed to the ED.

## 2023-04-14 ENCOUNTER — Ambulatory Visit (INDEPENDENT_AMBULATORY_CARE_PROVIDER_SITE_OTHER): Payer: BC Managed Care – PPO | Admitting: Podiatry

## 2023-04-14 ENCOUNTER — Encounter: Payer: Self-pay | Admitting: Podiatry

## 2023-04-14 DIAGNOSIS — M205X1 Other deformities of toe(s) (acquired), right foot: Secondary | ICD-10-CM

## 2023-04-14 DIAGNOSIS — G5781 Other specified mononeuropathies of right lower limb: Secondary | ICD-10-CM

## 2023-04-14 DIAGNOSIS — M25871 Other specified joint disorders, right ankle and foot: Secondary | ICD-10-CM | POA: Diagnosis not present

## 2023-04-14 NOTE — Progress Notes (Signed)
She presents today for her first postop visit date of surgery March 27, 2023.  She had a right foot Keller bunionectomy with silicone implant tibial sesamoidectomy and a neurectomy to the third interdigital space of the right foot her pain is more manageable she is experiencing some numbness.  Denies any other acute complaints  Objective: Vitals are stable she is alert and oriented x 3.  Pulses are palpable.  Presents with her cam boot on once removed demonstrates moderate edema no erythema to the dorsum of the foot however some to the medial aspect of the first metatarsophalangeal joint.  She has good range of motion passively first metatarsal phalangeal joint.  Good alignment of the toes.  Assessment: Well-healing surgical foot.  Plan: Redressed today dressed a compressive dressing.  Stitches were removed.  No complication noted.  Encouraged her to continue range of motion exercises of the first metatarsophalangeal joint.  She will see Dr. Al Corpus back in 3 weeks for final follow-up

## 2023-04-29 ENCOUNTER — Telehealth: Payer: Self-pay | Admitting: Podiatry

## 2023-04-29 ENCOUNTER — Telehealth: Payer: Self-pay

## 2023-04-29 NOTE — Telephone Encounter (Signed)
Patient called - she is about 4 and a half weeks out from surgery. She is still having issues with pain,numbness, tingling and stability. Is this normal at this point? Her next post op is 12/18

## 2023-04-29 NOTE — Telephone Encounter (Signed)
Received call from patient - sd [employer] disability claim sd they need additional information to extend the RTW date to 06/01/2023 ...  Advised the previous paperwork was submitted with this RTW date.  Advised will call [employer]  ....    <>  <>  <>      Called [employer] C/S @ [phone] -- s/w Nanette who said Ms Audree Bane is the patient's claim specialist and can be reached at same pn# - no ext# avail.   Said if we fax them the most recent office visit notes (04/14/23) she will have them reviewed by the claims specialist and she will reach out to the patient.  Called Ms Mcnorton and advised the same .Marland Kitchen...    [name] -- 04/29/2023

## 2023-05-06 ENCOUNTER — Ambulatory Visit (INDEPENDENT_AMBULATORY_CARE_PROVIDER_SITE_OTHER): Payer: BC Managed Care – PPO

## 2023-05-06 ENCOUNTER — Encounter: Payer: Self-pay | Admitting: Podiatry

## 2023-05-06 ENCOUNTER — Ambulatory Visit (INDEPENDENT_AMBULATORY_CARE_PROVIDER_SITE_OTHER): Payer: BC Managed Care – PPO | Admitting: Podiatry

## 2023-05-06 DIAGNOSIS — M25871 Other specified joint disorders, right ankle and foot: Secondary | ICD-10-CM

## 2023-05-06 DIAGNOSIS — M205X1 Other deformities of toe(s) (acquired), right foot: Secondary | ICD-10-CM | POA: Diagnosis not present

## 2023-05-06 DIAGNOSIS — G5781 Other specified mononeuropathies of right lower limb: Secondary | ICD-10-CM

## 2023-05-06 DIAGNOSIS — Z9889 Other specified postprocedural states: Secondary | ICD-10-CM

## 2023-05-06 NOTE — Progress Notes (Signed)
She presents today for postop visit date of surgery March 27, 2023.  She is status post Keller implant with sesamoidectomy and a neurectomy to the third interdigital space.  She says it is not doing as good as I would like for her to be at the swelling has come down.  It feels tight around my forefoot and I cannot make the time to move really well.  She states that she is still unable to get a shoe on.  When she cannot stand on her foot for very long period of time.  She states that the neurectomy has not really bothered her at all.  Objective: Vital signs are stable alert and oriented x 3 pulses are palpable left foot normal right foot swollen first intermetatarsal space she has restricted range of motion of the first metatarsophalangeal joint.  Radiographs taken today demonstrate osseously mature individual with significant demineralization.  Also demonstrates single silicone implant is intact.  Complete excision of tibial sesamoid.  Assessment: Well-healing surgical foot slow for [facility] implant.  Wounds have gone on to heal uneventfully.  Plan: At this point I am going to send her to physical therapy and [place].  She is scheduled to go back to work the first week or so of January however I do believe he is going to be at least another month before she will be able to get his shoe on and ambulate enough to perform her duties at work.  I will follow-up with her once physical therapy has completed their evaluation and treatment plan.  I did recommend that she try wider open shoes so that she could start practicing walking and forcing herself to bend the toe.

## 2023-06-01 ENCOUNTER — Telehealth: Payer: Self-pay | Admitting: Podiatry

## 2023-06-01 NOTE — Telephone Encounter (Signed)
 Received call from patient -- sd [employer] told her they were missing documents and claim was on hold.  Ms. Meixner asked about RTW.   I advised (per notes) the RTW date is 06/01/2023 <today> ....   She is doing P/T each Monday and Wednesday during January.  She has not had a follow up visit with Dr. Verta -- she said was never given one -- since that started.  Sent a message to provider to get additional information on RTW .SABRA...     [name] -- 06/01/2023

## 2023-06-01 NOTE — Telephone Encounter (Signed)
 Called [employer] [phone]) -- s/w Irving Burton who said they faxed over documents but was the wrong fax# -- she will re-fax to [phone] .-- it s a questionnaire ......     [name] -- 06/01/2023

## 2023-06-02 ENCOUNTER — Encounter: Payer: Self-pay | Admitting: Podiatry

## 2023-06-02 ENCOUNTER — Telehealth: Payer: Self-pay | Admitting: Podiatry

## 2023-06-02 NOTE — Telephone Encounter (Signed)
 Per Dr. Janit, the patient should remain out of work until 06/12/2023 --  when she can be seen and evaluated by him -- (patient still having paid - Dr. Verta if out of office).  Faxed a Dr's letter with new/tentative RTW date to patient's HR Deanne Norvelt @ [phone]) and faxed same to [facility] - [phone]) ....     [name] -- 06/02/2023

## 2023-06-04 ENCOUNTER — Telehealth: Payer: Self-pay | Admitting: Podiatry

## 2023-06-04 NOTE — Telephone Encounter (Signed)
Per Ms Sanyah, [employer] still needs the provider name and confirmation of extended RTW (06/12/2023) .Marland Kitchen...   Faxed to [employer] but they said not received, per patient.  I called [employer] and got an e-mail address (fax# has been having issues) ....   E-mail [email or web address] ....   Sent the e-mail with provider name, address and copy of the 05/06/2023 office notes.  The patients next appt is 06/12/2023 .Marland Kitchen...    [name] -- 06/04/2023

## 2023-06-12 ENCOUNTER — Telehealth: Payer: Self-pay | Admitting: Podiatry

## 2023-06-12 ENCOUNTER — Ambulatory Visit (INDEPENDENT_AMBULATORY_CARE_PROVIDER_SITE_OTHER): Payer: BC Managed Care – PPO | Admitting: Podiatry

## 2023-06-12 ENCOUNTER — Encounter: Payer: Self-pay | Admitting: Podiatry

## 2023-06-12 ENCOUNTER — Ambulatory Visit (INDEPENDENT_AMBULATORY_CARE_PROVIDER_SITE_OTHER): Payer: BC Managed Care – PPO

## 2023-06-12 DIAGNOSIS — Z9889 Other specified postprocedural states: Secondary | ICD-10-CM | POA: Diagnosis not present

## 2023-06-12 DIAGNOSIS — G5761 Lesion of plantar nerve, right lower limb: Secondary | ICD-10-CM | POA: Diagnosis not present

## 2023-06-12 DIAGNOSIS — G5762 Lesion of plantar nerve, left lower limb: Secondary | ICD-10-CM

## 2023-06-12 MED ORDER — GABAPENTIN 100 MG PO CAPS: 100.0000 mg | ORAL_CAPSULE | Freq: Three times a day (TID) | ORAL | 1 refills | Status: DC

## 2023-06-12 NOTE — Progress Notes (Signed)
Chief Complaint  Patient presents with   Routine Post Op    POV DOS 03/27/2023  Lorenz Coaster implant with sesamoidectomy and a neurectomy to the third interdigital space right "It's not doing as good as I wanted."    Subjective:  Patient presents today status post right great toe arthroplasty with implant and tibial sesamoidectomy.  Neurectomy third intermetatarsal space right.  DOS: 03/27/2023.  Dr. Al Corpus.  Patient states that the only shoes that she can wear currently are crocs.  She experiences numbness with tingling and tight sensation to all of the toes of the right forefoot.  She says after standing for about 20 minutes the entire forefoot goes numb.  She has been going to physical therapy but there has been no improvement.  Past Medical History:  Diagnosis Date   Acid reflux    H/O PRIOR TO GASTRC BYPASS   Anemia    H/O   Anxiety    Arthritis    Bladder spasm    Depression    Headache    H/O MIGRAINES   History of kidney stones    Left ureteral stone    Leg DVT (deep venous thromboembolism), acute (HCC)    x 2-both times after a surgery-went to see hematologist 2017 (Dr Orlie Dakin)  and no clotting disorder ever found   Restless leg syndrome    Sepsis (HCC)    FROM KIDNEY STONES   Sleep apnea    HAD GASTRIC BYPASS AND DOES NOT HAVE SLEEP APNEA   Urge incontinence     Past Surgical History:  Procedure Laterality Date   ABDOMINAL HYSTERECTOMY     TOTAL ABDOMINAL HYSTERECTOMY W/ BILATERAL SALPINGOOPHORECTOMY   BARIATRIC SURGERY  05/15/2016   CHOLECYSTECTOMY     ESOPHAGOGASTRODUODENOSCOPY  05/15/2016   KIDNEY STONE SURGERY     KNEE ARTHROSCOPY Right    X 2   KNEE ARTHROSCOPY Right 11/11/2018   Procedure: Partial right knee arthroscopy,arthroscopic medial and lateral menisectomy, plica excision;  Surgeon: Kennedy Bucker, MD;  Location: [facility];  Service: Orthopedics;  Laterality: Right;   KNEE SURGERY Left 2006   SHOULDER ARTHROSCOPY WITH OPEN ROTATOR CUFF REPAIR Right  11/22/2015   Procedure: SHOULDER ARTHROSCOPY, DEBRIDEMENT, DECOMPRESSION, SLAP REPAIR, POSSIBLE BICEP TENODESIS;  Surgeon: Christena Flake, MD;  Location: [facility];  Service: Orthopedics;  Laterality: Right;   SINUS EXPLORATION      Allergies  Allergen Reactions   Alcohol Other (See Comments)    wool   Augmentin [Amoxicillin-Pot Clavulanate] Tinitus   Bacitracin-Neomycin-Polymyxin Other (See Comments)   Bacitracin-Polymyxin B     Other reaction(s): rash   Codeine Nausea Only and Other (See Comments)    "headache" Other reaction(s): rash   Hydrocodone Other (See Comments)    Other reaction(s): rash   Hydrocortisone Other (See Comments)   Lanolin Other (See Comments)    "rash" Other reaction(s): Unknown   Neomycin    Norepinephrine Bitartrate    Other Other (See Comments)    Bitartrate/black rubber dye/elastic Other reaction(s): Unknown   Oxycodone-Acetaminophen     Other reaction(s): rash   Penicillin G Sodium     Other reaction(s): rash   Tape Other (See Comments)    Other reaction(s): Unknown-OK TO USE PAPER TAPE   Tapentadol Other (See Comments)   Tramadol Other (See Comments)    "headache"   Tramadol Hcl     Other reaction(s): headaches   Trimethoprim    Wound Dressings     Other reaction(s): rash   Bacitracin  Rash   Clarithromycin Rash   Latex Rash    Other reaction(s): rash Other reaction(s): rash   Nickel Rash and Other (See Comments)   Oxycodone Rash   Penicillins Rash    Has patient had a PCN reaction causing immediate rash, facial/tongue/throat swelling, SOB or lightheadedness with hypotension: yes Has patient had a PCN reaction causing severe rash involving mucus membranes or skin necrosis: no Has patient had a PCN reaction that required hospitalization No Has patient had a PCN reaction occurring within the last 10 years: No If all of the above answers are "NO", then may proceed with Cephalosporin use.    Sulfamethoxazole-Trimethoprim Rash     Objective/Physical Exam Neurovascular status intact.  Incisions are nicely healed.  No edema or erythema noted.  There is some limited range of motion of the first MTP of the right foot.  Numbness with tingling sensation noted to all the digits of the right foot beginning around the MTP forward  Radiographic Exam RT foot 06/12/2023:  Arthroplasty implant noted to the first MTP of the right foot with metallic grommets which appear to be well-seated.  Normal osseous mineralization.  No acute fractures identified.  Assessment: 1. s/p right great toe arthroplasty with implant.  Morton's neurectomy third intermetatarsal space right. DOS: 03/27/2023.  Dr. Al Corpus. 2.  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 X-rays reviewed - I do believe the patient would benefit from gabapentin to see if this helps with her neuritis type symptoms to the forefoot -Prescription for gabapentin 100 mg TID -Also discussed custom orthotics.  I do believe orthotics to help support the medial longitudinal arch of the foot and offload pressure from the forefoot would be beneficial as well.  This should help alleviate some of the pressure applied to the forefoot -Today the patient was molded for custom orthotics by our Pedorthist -Patient is not currently in a position to return to work.  We will extend this x 4 weeks. -Discontinue physical therapy.  Patient has been going and there has been no improvement and she states that it actually is causing more pain to the forefoot -Return to clinic 4 weeks Dr. Al Corpus   Felecia Shelling, DPM [facility]  Dr. Felecia Shelling, DPM    [address] [place], [place] [address]                Office [phone]  Fax [phone]

## 2023-06-12 NOTE — Telephone Encounter (Signed)
Per Dr. Logan Bores, this patient is not ready to return to work and the RTW date needs to be updated to four (4) weeks out.  The patient is to follow up with Dr. Al Corpus at that time.  Sent a fax to [employer] [phone]) with a doctors letter advising same .Marland Kitchen...      [name] -- 06/12/2023

## 2023-06-14 ENCOUNTER — Encounter: Payer: Self-pay | Admitting: Emergency Medicine

## 2023-06-14 ENCOUNTER — Ambulatory Visit
Admission: EM | Admit: 2023-06-14 | Discharge: 2023-06-14 | Disposition: A | Discharge status: Home / Self Care | Payer: BC Managed Care – PPO | Attending: Family Medicine | Admitting: Family Medicine

## 2023-06-14 DIAGNOSIS — J0140 Acute pansinusitis, unspecified: Secondary | ICD-10-CM | POA: Diagnosis not present

## 2023-06-14 MED ORDER — AZITHROMYCIN 250 MG PO TABS: ORAL_TABLET | ORAL | 0 refills | Status: DC

## 2023-06-14 MED ORDER — PREDNISONE 10 MG (21) PO TBPK: ORAL_TABLET | Freq: Every day | ORAL | 0 refills | Status: DC

## 2023-06-14 NOTE — ED Triage Notes (Signed)
Patient reports congestion and drainage that started 3 weeks ago.  Patient has tried OTC sinus medicine with no relief.

## 2023-06-14 NOTE — Discharge Instructions (Signed)
Stop by the pharmacy to pick up your prescriptions.  Follow up with your primary care provider as needed.

## 2023-06-14 NOTE — ED Provider Notes (Signed)
[facility]    CSN: [id number] Arrival date & time: 06/14/23  1430      History   Chief Complaint Chief Complaint  Patient presents with   Sinus Problem    HPI Catrice A Passarella is a 63 y.o. female.   HPI  History obtained from the patient. Laurel presents for ongoing thick green nasal congestion that started about month ago.  She used OTC medications which initially worked but now things are getting worse.  No fever. Started getting a headache on Thursday.  Drainage has her coughing at night.       Past Medical History:  Diagnosis Date   Acid reflux    H/O PRIOR TO GASTRC BYPASS   Anemia    H/O   Anxiety    Arthritis    Bladder spasm    Depression    Headache    H/O MIGRAINES   History of kidney stones    Left ureteral stone    Leg DVT (deep venous thromboembolism), acute (HCC)    x 2-both times after a surgery-went to see hematologist 2017 (Dr Orlie Dakin)  and no clotting disorder ever found   Restless leg syndrome    Sepsis (HCC)    FROM KIDNEY STONES   Sleep apnea    HAD GASTRIC BYPASS AND DOES NOT HAVE SLEEP APNEA   Urge incontinence     Patient Active Problem List   Diagnosis Date Noted   Preoperative evaluation to rule out surgical contraindication 12/02/2021   Pulmonary nodules 12/02/2021   Allergic rhinitis 07/04/2021   Allergic rhinitis due to animal (cat) (dog) hair and dander 07/04/2021   Allergic rhinitis due to pollen 07/04/2021   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/2021   Urticaria 07/04/2021   Acquired pes planus of left foot 07/01/2021   Pain in left foot 07/01/2021   Tendinitis of left foot 07/01/2021   Anxiety 03/30/2019   Depression 03/30/2019   Migraine 03/30/2019   Marginal ulcer 03/24/2017   S/P gastric bypass 03/24/2017   Sepsis (HCC) 11/15/2016   Hydronephrosis 11/15/2016   GERD (gastroesophageal reflux disease) 11/15/2016   UTI (urinary tract infection) 11/15/2016   Right leg DVT (HCC) 01/24/2016    Acute deep vein thrombosis (DVT) of popliteal vein of right lower extremity (HCC) 01/04/2016   Incomplete tear of right rotator cuff 11/22/2015   Moderate obstructive sleep apnea 09/21/2015   Kidney stones 09/10/2015   Urge incontinence 09/10/2015   Nocturia 09/10/2015   Joint pain 09/05/2015   S/P laparoscopic cholecystectomy 09/05/2015   Paralabral cyst of shoulder, right, subsequent encounter 01/10/2015   Rotator cuff tendinitis, right 01/10/2015   Tear of right glenoid labrum 01/10/2015   Acute ankle pain 11/16/2013   Acute shoulder pain 11/16/2013    Past Surgical History:  Procedure Laterality Date   ABDOMINAL HYSTERECTOMY     TOTAL ABDOMINAL HYSTERECTOMY W/ BILATERAL SALPINGOOPHORECTOMY   BARIATRIC SURGERY  05/15/2016   CHOLECYSTECTOMY     ESOPHAGOGASTRODUODENOSCOPY  05/15/2016   KIDNEY STONE SURGERY     KNEE ARTHROSCOPY Right    X 2   KNEE ARTHROSCOPY Right 11/11/2018   Procedure: Partial right knee arthroscopy,arthroscopic medial and lateral menisectomy, plica excision;  Surgeon: Kennedy Bucker, MD;  Location: [facility];  Service: Orthopedics;  Laterality: Right;   KNEE SURGERY Left 2006   SHOULDER ARTHROSCOPY WITH OPEN ROTATOR CUFF REPAIR Right 11/22/2015   Procedure: SHOULDER ARTHROSCOPY, DEBRIDEMENT, DECOMPRESSION, SLAP REPAIR, POSSIBLE BICEP TENODESIS;  Surgeon: Christena Flake, MD;  Location: [facility];  Service: Orthopedics;  Laterality: Right;   SINUS EXPLORATION      OB History   No obstetric history on file.      Home Medications    Prior to Admission medications   Medication Sig Start Date End Date Taking? Authorizing Provider  azithromycin (ZITHROMAX Z-PAK) 250 MG tablet Take 2 tablets on day 1 then 1 tablet daily 06/14/23  Yes Evelisse Szalkowski, DO  predniSONE (STERAPRED UNI-PAK 21 TAB) 10 MG (21) TBPK tablet Take by mouth daily. Take 6 tabs by mouth daily for 1, then 5 tabs for 1 day, then 4 tabs for 1 day, then 3 tabs for 1 day, then 2 tabs for 1 day, then 1 tab  for 1 day. 06/14/23  Yes Daking Westervelt, DO  gabapentin (NEURONTIN) 100 MG capsule Take 1 capsule (100 mg total) by mouth 3 (three) times daily. 06/12/23   Felecia Shelling, DPM  levocetirizine (XYZAL) 5 MG tablet SMARTSIG:1 Tablet(s) By Mouth Every Evening 03/04/23   [provider]  montelukast (SINGULAIR) 10 MG tablet Take 10 mg by mouth at bedtime.    [provider]  triamcinolone (NASACORT) 55 MCG/ACT AERO nasal inhaler 1 spray in each nostril    [provider]  apixaban (ELIQUIS) 5 MG TABS tablet Take 1 tablet (5 mg total) by mouth 2 (two) times daily. 11/11/18 04/15/19  Kennedy Bucker, MD    Family History Family History  Problem Relation Age of Onset   Cancer Mother        rectal   Lung cancer Father    Hematuria Brother    Prostate cancer Brother    Kidney Stones Brother    Kidney disease Paternal Grandfather    Bladder Cancer Neg Hx    Kidney cancer Neg Hx     Social History Social History   Tobacco Use   Smoking status: Never   Smokeless tobacco: Never  Vaping Use   Vaping status: Never Used  Substance Use Topics   Alcohol use: No    Alcohol/week: 0.0 standard drinks of alcohol   Drug use: No     Allergies   Alcohol, Augmentin [amoxicillin-pot clavulanate], Bacitracin-neomycin-polymyxin, Bacitracin-polymyxin b, Codeine, Hydrocodone, Hydrocortisone, Lanolin, Neomycin, Norepinephrine bitartrate, Other, Oxycodone-acetaminophen, Penicillin g sodium, Tape, Tapentadol, Tramadol, Tramadol hcl, Trimethoprim, Wound dressings, Bacitracin, Clarithromycin, Latex, Nickel, Oxycodone, Penicillins, and Sulfamethoxazole-trimethoprim   Review of Systems Review of Systems: negative unless otherwise stated in HPI.      Physical Exam Triage Vital Signs ED Triage Vitals  Encounter Vitals Group     BP 06/14/23 1438 (!) 104/53     Systolic BP Percentile --      Diastolic BP Percentile --      Pulse Rate 06/14/23 1438 79     Resp 06/14/23 1438 15      Temp 06/14/23 1438 98.2 F (36.8 C)     Temp Source 06/14/23 1438 Oral     SpO2 06/14/23 1438 97 %     Weight 06/14/23 1436 216 lb 0.8 oz (98 kg)     Height 06/14/23 1436 5\' 6"  (1.676 m)     Head Circumference --      Peak Flow --      Pain Score 06/14/23 1436 3     Pain Loc --      Pain Education --      Exclude from Growth Chart --    No data found.  Updated Vital Signs BP (!) 104/53 (BP Location: Left Arm)   Pulse  79   Temp 98.2 F (36.8 C) (Oral)   Resp 15   Ht 5\' 6"  (1.676 m)   Wt 98 kg   SpO2 97%   BMI 34.87 kg/m   Visual Acuity Right Eye Distance:   Left Eye Distance:   Bilateral Distance:    Right Eye Near:   Left Eye Near:    Bilateral Near:     Physical Exam GEN:     alert, non-toxic appearing female in no distress   HENT:  mucus membranes moist, moderate erythematous edematous turbinates, clear nasal discharge, maxillary greater than frontal and ethmoid sinus tenderness EYES:   no scleral injection or discharge RESP:  no increased work of breathing Skin:   warm and dry    UC Treatments / Results  Labs (all labs ordered are listed, but only abnormal results are displayed) Labs Reviewed - No data to display  EKG   Radiology No results found.  Procedures Procedures (including critical care time)  Medications Ordered in UC Medications - No data to display  Initial Impression / Assessment and Plan / UC Course  I have reviewed the triage vital signs and the nursing notes.  Pertinent labs & imaging results that were available during my care of the patient were reviewed by me and considered in my medical decision making (see chart for details).       Pt is a 63 y.o. female who presents for sinus congestion and pain for past month. Wayne is afebrile without recent use of antipyretics. Satting well on room air. Overall pt is well appearing, well hydrated, without respiratory distress. COVID test deferred due to duration of symptoms.  Treat  with azithromycin as this has helped her in the past.  Recommended steroids as well and she is agreeable.  Descriptions sent to pharmacy. Discussed typical duration of symptoms. Ensure adequate fluid intake and rest.   Reviewed expectations re: course of current medical issues. Questions answered. Return and ED precautions given.  Patient verbalized understanding. After Visit Summary given.  Discussed MDM, treatment plan and plan for follow-up with patient who agrees with plan.     Final Clinical Impressions(s) / UC Diagnoses   Final diagnoses:  Acute non-recurrent pansinusitis     Discharge Instructions      Stop by the pharmacy to pick up your prescriptions.  Follow up with your primary care provider as needed.      ED Prescriptions     Medication Sig Dispense Auth. Provider   azithromycin (ZITHROMAX Z-PAK) 250 MG tablet Take 2 tablets on day 1 then 1 tablet daily 6 tablet Jahari Billy, DO   predniSONE (STERAPRED UNI-PAK 21 TAB) 10 MG (21) TBPK tablet Take by mouth daily. Take 6 tabs by mouth daily for 1, then 5 tabs for 1 day, then 4 tabs for 1 day, then 3 tabs for 1 day, then 2 tabs for 1 day, then 1 tab for 1 day. 21 tablet Katha Cabal, DO      PDMP not reviewed this encounter.   Katha Cabal, DO 06/14/23 1457

## 2023-06-23 ENCOUNTER — Encounter: Payer: Self-pay | Admitting: Podiatry

## 2023-06-23 ENCOUNTER — Telehealth: Payer: Self-pay | Admitting: Podiatry

## 2023-06-23 NOTE — Telephone Encounter (Signed)
 Called patient (she left msg) -- was confirming the [employer] paperwork extending her RTW to 02/21.  I advised [employer] was e-mailed 06/04/23 and her empl was faxed same.  She found that her next appt is 07/13/23 -- but RTW date was (before) on 07/10/2023 ... Was concerned that she was going back to work BEFORE her next appt .Sharon [name]...   Reached out to Dr. Verta [facility]) who approved moving the RTW to 07/15/2023 (after appt) .Sharon [name]...  Advised patient the same ....      [name] -- 06/23/2023

## 2023-06-25 ENCOUNTER — Telehealth: Payer: Self-pay

## 2023-06-25 NOTE — Telephone Encounter (Signed)
 Spoke with pt taking orthotics to  for her appt on 2/24 with dr Lara Plants. Asked about insurance told her they paid a small amount.

## 2023-07-06 ENCOUNTER — Telehealth: Payer: Self-pay | Admitting: Podiatry

## 2023-07-06 NOTE — Telephone Encounter (Signed)
 Completed STD paperwork from [facility] ....  Faxed to [phone] .Marland Kitchen...      J., Abbott -- 07/06/2023

## 2023-07-13 ENCOUNTER — Encounter: Payer: Self-pay | Admitting: Podiatry

## 2023-07-13 ENCOUNTER — Ambulatory Visit (INDEPENDENT_AMBULATORY_CARE_PROVIDER_SITE_OTHER): Payer: BC Managed Care – PPO | Admitting: Podiatry

## 2023-07-13 ENCOUNTER — Telehealth: Payer: Self-pay | Admitting: Podiatry

## 2023-07-13 DIAGNOSIS — G5781 Other specified mononeuropathies of right lower limb: Secondary | ICD-10-CM

## 2023-07-13 DIAGNOSIS — M205X1 Other deformities of toe(s) (acquired), right foot: Secondary | ICD-10-CM

## 2023-07-13 DIAGNOSIS — Z9889 Other specified postprocedural states: Secondary | ICD-10-CM

## 2023-07-13 DIAGNOSIS — M25871 Other specified joint disorders, right ankle and foot: Secondary | ICD-10-CM

## 2023-07-13 NOTE — Telephone Encounter (Signed)
 Spoke with patient today -- had appt with Dr. Al Corpus -- he confirmed her BTW as of 07/15/2023.  This was the same date listed on her paperwork to her HR and to [employer].  Her HR was notified of this date 06/23/2023 and [employer] notified 07/06/23.  Patient asked that we send a reminder to [employer] [phone])  ...  Sent fax to [facility] to confirm RTW 07/15/2023 .Marland Kitchen...     [name] -- 07/13/2023

## 2023-07-14 NOTE — Progress Notes (Signed)
 She presents today date of surgery 03/27/2023 status post Lorenz Coaster implant with sesamoidectomy and neurectomy.  She is also here to pick up her orthotics that Dr. Logan Bores had prescribed.  Objective: Vital signs are stable alert oriented x 3.  Pulses are palpable.  There is some numbness this remaining this aggravating her.  I did explain to her once again that the numbness around the third and fourth toes are completely normal she is going to have some numbness in the forefoot until the swelling goes down completely.  She states that the numbness feels as it did prior to surgery.  I expressed to her that it may be not from surgery that she is numb and that we may need to once again perform an i nerve conduction velocity exam.  She does have good range of motion of the first metatarsal phalangeal joint.  Incision site is gone on to heal uneventfully and swelling is reduced considerably.  She has no pain on palpation of the forefoot.  Assessment: Residual numbness.  Plan: I recommended that she continue her gabapentin and the use of her orthotics and get back to her regular routine.

## 2023-07-19 ENCOUNTER — Other Ambulatory Visit: Payer: Self-pay | Admitting: Podiatry

## 2023-07-21 ENCOUNTER — Telehealth: Payer: Self-pay | Admitting: Podiatry

## 2023-07-21 DIAGNOSIS — Z9889 Other specified postprocedural states: Secondary | ICD-10-CM

## 2023-07-21 NOTE — Telephone Encounter (Signed)
 Faxed STD forms and notes to [employer] to [phone]

## 2023-07-22 ENCOUNTER — Ambulatory Visit
Admission: EM | Admit: 2023-07-22 | Discharge: 2023-07-22 | Disposition: A | Discharge status: Home / Self Care | Attending: Physician Assistant | Admitting: Physician Assistant

## 2023-07-22 DIAGNOSIS — N390 Urinary tract infection, site not specified: Secondary | ICD-10-CM | POA: Insufficient documentation

## 2023-07-22 DIAGNOSIS — R300 Dysuria: Secondary | ICD-10-CM | POA: Diagnosis present

## 2023-07-22 LAB — URINALYSIS, W/ REFLEX TO CULTURE (INFECTION SUSPECTED)
Bilirubin Urine: NEGATIVE
Glucose, UA: NEGATIVE mg/dL
Hgb urine dipstick: NEGATIVE
Ketones, ur: NEGATIVE mg/dL
Nitrite: NEGATIVE
Protein, ur: NEGATIVE mg/dL
RBC / HPF: NONE SEEN RBC/hpf (ref 0–5)
Specific Gravity, Urine: 1.02 (ref 1.005–1.030)
pH: 5.5 (ref 5.0–8.0)

## 2023-07-22 MED ORDER — NITROFURANTOIN MONOHYD MACRO 100 MG PO CAPS: 100.0000 mg | ORAL_CAPSULE | Freq: Two times a day (BID) | ORAL | 0 refills | Status: AC

## 2023-07-22 NOTE — ED Provider Notes (Signed)
 [facility]    CSN: [id number] Arrival date & time: 07/22/23  1336      History   Chief Complaint Chief Complaint  Patient presents with   Dysuria    HPI Sharon [name] is a 63 y.o. female.   63 year old female, Sharon [name], presents to urgent care for evaluation of urinary frequency for 5 days.  Patient denies any hematuria, reports lower right sided back pain.  PMH: kidney stones,urosepsis, UTI, last uti was 10/24  The history is provided by the patient. No language interpreter was used.    Past Medical History:  Diagnosis Date   Acid reflux    H/O PRIOR TO GASTRC BYPASS   Anemia    H/O   Anxiety    Arthritis    Bladder spasm    Depression    Headache    H/O MIGRAINES   History of kidney stones    Left ureteral stone    Leg DVT (deep venous thromboembolism), acute (HCC)    x 2-both times after a surgery-went to see hematologist 2017 (Dr Orlie Dakin)  and no clotting disorder ever found   Restless leg syndrome    Sepsis (HCC)    FROM KIDNEY STONES   Sleep apnea    HAD GASTRIC BYPASS AND DOES NOT HAVE SLEEP APNEA   Urge incontinence     Patient Active Problem List   Diagnosis Date Noted   Dysuria 07/22/2023   Preoperative evaluation to rule out surgical contraindication 12/02/2021   Pulmonary nodules 12/02/2021   Allergic rhinitis 07/04/2021   Allergic rhinitis due to animal (cat) (dog) hair and dander 07/04/2021   Allergic rhinitis due to pollen 07/04/2021   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/2021   Urticaria 07/04/2021   Acquired pes planus of left foot 07/01/2021   Pain in left foot 07/01/2021   Tendinitis of left foot 07/01/2021   Anxiety 03/30/2019   Depression 03/30/2019   Migraine 03/30/2019   Marginal ulcer 03/24/2017   S/P gastric bypass 03/24/2017   Sepsis (HCC) 11/15/2016   Hydronephrosis 11/15/2016   GERD (gastroesophageal reflux disease) 11/15/2016   Acute UTI 11/15/2016   Right leg DVT (HCC) 01/24/2016   Acute  deep vein thrombosis (DVT) of popliteal vein of right lower extremity (HCC) 01/04/2016   Incomplete tear of right rotator cuff 11/22/2015   Moderate obstructive sleep apnea 09/21/2015   Kidney stones 09/10/2015   Urge incontinence 09/10/2015   Nocturia 09/10/2015   Joint pain 09/05/2015   S/P laparoscopic cholecystectomy 09/05/2015   Paralabral cyst of shoulder, right, subsequent encounter 01/10/2015   Rotator cuff tendinitis, right 01/10/2015   Tear of right glenoid labrum 01/10/2015   Acute ankle pain 11/16/2013   Acute shoulder pain 11/16/2013    Past Surgical History:  Procedure Laterality Date   ABDOMINAL HYSTERECTOMY     TOTAL ABDOMINAL HYSTERECTOMY W/ BILATERAL SALPINGOOPHORECTOMY   BARIATRIC SURGERY  05/15/2016   CHOLECYSTECTOMY     ESOPHAGOGASTRODUODENOSCOPY  05/15/2016   KIDNEY STONE SURGERY     KNEE ARTHROSCOPY Right    X 2   KNEE ARTHROSCOPY Right 11/11/2018   Procedure: Partial right knee arthroscopy,arthroscopic medial and lateral menisectomy, plica excision;  Surgeon: Kennedy Bucker, MD;  Location: [facility];  Service: Orthopedics;  Laterality: Right;   KNEE SURGERY Left 2006   SHOULDER ARTHROSCOPY WITH OPEN ROTATOR CUFF REPAIR Right 11/22/2015   Procedure: SHOULDER ARTHROSCOPY, DEBRIDEMENT, DECOMPRESSION, SLAP REPAIR, POSSIBLE BICEP TENODESIS;  Surgeon: Christena Flake, MD;  Location: [facility];  Service: Orthopedics;  Laterality: Right;   SINUS EXPLORATION      OB History   No obstetric history on file.      Home Medications    Prior to Admission medications   Medication Sig Start Date End Date Taking? Authorizing Provider  gabapentin (NEURONTIN) 100 MG capsule TAKE 1 CAPSULE(100 MG) BY MOUTH THREE TIMES DAILY 07/20/23  Yes Felecia Shelling, DPM  levocetirizine (XYZAL) 5 MG tablet SMARTSIG:1 Tablet(s) By Mouth Every Evening 03/04/23  Yes [provider]  montelukast (SINGULAIR) 10 MG tablet Take 10 mg by mouth at bedtime.   Yes [provider]   nitrofurantoin, macrocrystal-monohydrate, (MACROBID) 100 MG capsule Take 1 capsule (100 mg total) by mouth 2 (two) times daily for 7 days. 07/22/23 07/29/23 Yes Ruchi Stoney, Para March, NP  triamcinolone (NASACORT) 55 MCG/ACT AERO nasal inhaler 1 spray in each nostril   Yes [provider]  apixaban (ELIQUIS) 5 MG TABS tablet Take 1 tablet (5 mg total) by mouth 2 (two) times daily. 11/11/18 04/15/19  Kennedy Bucker, MD    Family History Family History  Problem Relation Age of Onset   Cancer Mother        rectal   Lung cancer Father    Hematuria Brother    Prostate cancer Brother    Kidney Stones Brother    Kidney disease Paternal Grandfather    Bladder Cancer Neg Hx    Kidney cancer Neg Hx     Social History Social History   Tobacco Use   Smoking status: Never   Smokeless tobacco: Never  Vaping Use   Vaping status: Never Used  Substance Use Topics   Alcohol use: No    Alcohol/week: 0.0 standard drinks of alcohol   Drug use: No     Allergies   Alcohol, Augmentin [amoxicillin-pot clavulanate], Bacitracin-neomycin-polymyxin, Bacitracin-polymyxin b, Codeine, Hydrocodone, Hydrocortisone, Lanolin, Neomycin, Norepinephrine bitartrate, Other, Oxycodone-acetaminophen, Penicillin g sodium, Tape, Tapentadol, Tramadol, Tramadol hcl, Trimethoprim, Wound dressings, Bacitracin, Clarithromycin, Latex, Nickel, Oxycodone, Penicillins, and Sulfamethoxazole-trimethoprim   Review of Systems Review of Systems  Constitutional:  Negative for fever.  Gastrointestinal:  Positive for abdominal pain. Negative for nausea and vomiting.  Genitourinary:  Positive for dysuria and frequency. Negative for flank pain and hematuria.  Musculoskeletal:  Positive for back pain.  All other systems reviewed and are negative.    Physical Exam Triage Vital Signs ED Triage Vitals  Encounter Vitals Group     BP 07/22/23 1350 133/76     Systolic BP Percentile --      Diastolic BP Percentile --      Pulse  Rate 07/22/23 1350 70     Resp 07/22/23 1350 16     Temp 07/22/23 1350 98.2 F (36.8 C)     Temp Source 07/22/23 1350 Oral     SpO2 07/22/23 1350 97 %     Weight 07/22/23 1349 214 lb (97.1 kg)     Height 07/22/23 1349 5\' 6"  (1.676 m)     Head Circumference --      Peak Flow --      Pain Score 07/22/23 1352 4     Pain Loc --      Pain Education --      Exclude from Growth Chart --    No data found.  Updated Vital Signs BP 133/76 (BP Location: Left Wrist)   Pulse 70   Temp 98.2 F (36.8 C) (Oral)   Resp 16   Ht 5\' 6"  (1.676 m)   Wt 214 lb (  97.1 kg)   SpO2 97%   BMI 34.54 kg/m   Visual Acuity Right Eye Distance:   Left Eye Distance:   Bilateral Distance:    Right Eye Near:   Left Eye Near:    Bilateral Near:     Physical Exam Vitals and nursing note reviewed.  Abdominal:     Tenderness: There is abdominal tenderness in the suprapubic area.  Musculoskeletal:     Lumbar back: Tenderness present.       Back:  Neurological:     General: No focal deficit present.     Mental Status: She is alert and oriented to person, place, and time.     GCS: GCS eye subscore is 4. GCS verbal subscore is 5. GCS motor subscore is 6.     Cranial Nerves: No cranial nerve deficit.     Sensory: No sensory deficit.  Psychiatric:        Attention and Perception: Attention normal.        Mood and Affect: Mood normal.        Speech: Speech normal.        Behavior: Behavior normal.      UC Treatments / Results  Labs (all labs ordered are listed, but only abnormal results are displayed) Labs Reviewed  URINALYSIS, W/ REFLEX TO CULTURE (INFECTION SUSPECTED) - Abnormal; Notable for the following components:      Result Value   Leukocytes,Ua TRACE (*)    Bacteria, UA FEW (*)    All other components within normal limits  URINE CULTURE    EKG   Radiology No results found.  Procedures Procedures (including critical care time)  Medications Ordered in UC Medications - No data  to display  Initial Impression / Assessment and Plan / UC Course  I have reviewed the triage vital signs and the nursing notes.  Pertinent labs & imaging results that were available during my care of the patient were reviewed by me and considered in my medical decision making (see chart for details).  Clinical Course as of 07/22/23 2054  Wed Jul 22, 2023  1420 UA is positive for leukocyes and bacteria, 11-20, discussed exam findings and plan of care with patient, will treat with antibiotic (macrobid) and send culture, no abx in last month,last uti was 10/24.  Strict go to ER precautions given.  Pt verbalized understanding to this provider [JD]    Clinical Course User Index [JD] Altariq Goodall, Para March, NP  Discussed exam findings and plan of care with patient, strict go to ER precautions given.   Patient verbalized understanding to this provider.  Ddx: Acute UTI, dysuria, kidney stone Final Clinical Impressions(s) / UC Diagnoses   Final diagnoses:  Acute UTI  Dysuria     Discharge Instructions      You urine is positive for UTI. Take antibiotic as directed. Drink plenty of water, follow up with PCP.   Go to Er for new or worsening issues or concerns(fever, nausea, vomiting, unable to keep meds down, muscle aches, etc)     ED Prescriptions     Medication Sig Dispense Auth. Provider   nitrofurantoin, macrocrystal-monohydrate, (MACROBID) 100 MG capsule Take 1 capsule (100 mg total) by mouth 2 (two) times daily for 7 days. 14 capsule Amirah Goerke, Para March, NP      PDMP not reviewed this encounter.   Clancy Gourd, NP 07/22/23 2055

## 2023-07-22 NOTE — Discharge Instructions (Signed)
You urine is positive for UTI. Take antibiotic as directed. Drink plenty of water, follow up with PCP.   Go to Er for new or worsening issues or concerns(fever, nausea, vomiting, unable to keep meds down, muscle aches, etc)

## 2023-07-22 NOTE — ED Triage Notes (Signed)
 Pt c/o urinary freq & pain x5 days. Denies any burning or hematuria.

## 2023-07-25 LAB — URINE CULTURE: Culture: 20000 — AB

## 2023-08-17 ENCOUNTER — Ambulatory Visit
Admission: RE | Admit: 2023-08-17 | Discharge: 2023-08-17 | Disposition: A | Discharge status: Home / Self Care | Source: Ambulatory Visit | Attending: Family Medicine | Admitting: Family Medicine

## 2023-08-17 VITALS — BP 118/84 | HR 97 | Temp 99.1°F | Resp 15

## 2023-08-17 DIAGNOSIS — U071 COVID-19: Secondary | ICD-10-CM

## 2023-08-17 LAB — RESP PANEL BY RT-PCR (FLU A&B, COVID) ARPGX2
Influenza A by PCR: NEGATIVE
Influenza B by PCR: NEGATIVE
SARS Coronavirus 2 by RT PCR: POSITIVE — AB

## 2023-08-17 MED ORDER — ALBUTEROL SULFATE HFA 108 (90 BASE) MCG/ACT IN AERS: 2.0000 | INHALATION_SPRAY | RESPIRATORY_TRACT | 0 refills | Status: AC | PRN

## 2023-08-17 NOTE — Discharge Instructions (Signed)

## 2023-08-17 NOTE — ED Provider Notes (Signed)
 [facility]    CSN: [id number] Arrival date & time: 08/17/23  1338      History   Chief Complaint Chief Complaint  Patient presents with   Cough    Chills, sore throat, drainage, headache, sinus issuses - Entered by patient   Headache    HPI Valery A Kooi is a 63 y.o. female.   HPI  History obtained from the patient. Josefine presents for cough, body aches, headache, sore throat, chills, nasal congestion that started on Saturday.  No known fever. Denies vomiting and diarrhea.      Past Medical History:  Diagnosis Date   Acid reflux    H/O PRIOR TO GASTRC BYPASS   Anemia    H/O   Anxiety    Arthritis    Bladder spasm    Depression    Headache    H/O MIGRAINES   History of kidney stones    Left ureteral stone    Leg DVT (deep venous thromboembolism), acute (HCC)    x 2-both times after a surgery-went to see hematologist 2017 (Dr Orlie Dakin)  and no clotting disorder ever found   Restless leg syndrome    Sepsis (HCC)    FROM KIDNEY STONES   Sleep apnea    HAD GASTRIC BYPASS AND DOES NOT HAVE SLEEP APNEA   Urge incontinence     Patient Active Problem List   Diagnosis Date Noted   Dysuria 07/22/2023   Preoperative evaluation to rule out surgical contraindication 12/02/2021   Pulmonary nodules 12/02/2021   Allergic rhinitis 07/04/2021   Allergic rhinitis due to animal (cat) (dog) hair and dander 07/04/2021   Allergic rhinitis due to pollen 07/04/2021   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/2021   Urticaria 07/04/2021   Acquired pes planus of left foot 07/01/2021   Pain in left foot 07/01/2021   Tendinitis of left foot 07/01/2021   Anxiety 03/30/2019   Depression 03/30/2019   Migraine 03/30/2019   Marginal ulcer 03/24/2017   S/P gastric bypass 03/24/2017   Sepsis (HCC) 11/15/2016   Hydronephrosis 11/15/2016   GERD (gastroesophageal reflux disease) 11/15/2016   Acute UTI 11/15/2016   Right leg DVT (HCC) 01/24/2016   Acute deep vein  thrombosis (DVT) of popliteal vein of right lower extremity (HCC) 01/04/2016   Incomplete tear of right rotator cuff 11/22/2015   Moderate obstructive sleep apnea 09/21/2015   Kidney stones 09/10/2015   Urge incontinence 09/10/2015   Nocturia 09/10/2015   Joint pain 09/05/2015   S/P laparoscopic cholecystectomy 09/05/2015   Paralabral cyst of shoulder, right, subsequent encounter 01/10/2015   Rotator cuff tendinitis, right 01/10/2015   Tear of right glenoid labrum 01/10/2015   Acute ankle pain 11/16/2013   Acute shoulder pain 11/16/2013    Past Surgical History:  Procedure Laterality Date   ABDOMINAL HYSTERECTOMY     TOTAL ABDOMINAL HYSTERECTOMY W/ BILATERAL SALPINGOOPHORECTOMY   BARIATRIC SURGERY  05/15/2016   CHOLECYSTECTOMY     ESOPHAGOGASTRODUODENOSCOPY  05/15/2016   KIDNEY STONE SURGERY     KNEE ARTHROSCOPY Right    X 2   KNEE ARTHROSCOPY Right 11/11/2018   Procedure: Partial right knee arthroscopy,arthroscopic medial and lateral menisectomy, plica excision;  Surgeon: Kennedy Bucker, MD;  Location: [facility];  Service: Orthopedics;  Laterality: Right;   KNEE SURGERY Left 2006   SHOULDER ARTHROSCOPY WITH OPEN ROTATOR CUFF REPAIR Right 11/22/2015   Procedure: SHOULDER ARTHROSCOPY, DEBRIDEMENT, DECOMPRESSION, SLAP REPAIR, POSSIBLE BICEP TENODESIS;  Surgeon: Christena Flake, MD;  Location: [facility];  Service:  Orthopedics;  Laterality: Right;   SINUS EXPLORATION      OB History   No obstetric history on file.      Home Medications    Prior to Admission medications   Medication Sig Start Date End Date Taking? Authorizing Provider  levocetirizine (XYZAL) 5 MG tablet SMARTSIG:1 Tablet(s) By Mouth Every Evening 03/04/23  Yes [provider]  montelukast (SINGULAIR) 10 MG tablet Take 10 mg by mouth at bedtime.   Yes [provider]  phentermine (ADIPEX-P) 37.5 MG tablet Take 37.5 mg by mouth daily. 07/31/23  Yes [provider]  gabapentin (NEURONTIN) 100 MG  capsule TAKE 1 CAPSULE(100 MG) BY MOUTH THREE TIMES DAILY 07/20/23   Felecia Shelling, DPM  triamcinolone (NASACORT) 55 MCG/ACT AERO nasal inhaler 1 spray in each nostril    [provider]  apixaban (ELIQUIS) 5 MG TABS tablet Take 1 tablet (5 mg total) by mouth 2 (two) times daily. 11/11/18 04/15/19  Kennedy Bucker, MD    Family History Family History  Problem Relation Age of Onset   Cancer Mother        rectal   Lung cancer Father    Hematuria Brother    Prostate cancer Brother    Kidney Stones Brother    Kidney disease Paternal Grandfather    Bladder Cancer Neg Hx    Kidney cancer Neg Hx     Social History Social History   Tobacco Use   Smoking status: Never   Smokeless tobacco: Never  Vaping Use   Vaping status: Never Used  Substance Use Topics   Alcohol use: No    Alcohol/week: 0.0 standard drinks of alcohol   Drug use: No     Allergies   Alcohol, Augmentin [amoxicillin-pot clavulanate], Bacitracin-neomycin-polymyxin, Bacitracin-polymyxin b, Codeine, Hydrocodone, Hydrocortisone, Lanolin, Neomycin, Norepinephrine bitartrate, Other, Oxycodone-acetaminophen, Penicillin g sodium, Tape, Tapentadol, Tramadol, Tramadol hcl, Trimethoprim, Wound dressings, Bacitracin, Clarithromycin, Latex, Nickel, Oxycodone, Penicillins, and Sulfamethoxazole-trimethoprim   Review of Systems Review of Systems: negative unless otherwise stated in HPI.      Physical Exam Triage Vital Signs ED Triage Vitals  Encounter Vitals Group     BP 08/17/23 1405 118/84     Systolic BP Percentile --      Diastolic BP Percentile --      Pulse Rate 08/17/23 1405 97     Resp 08/17/23 1405 15     Temp 08/17/23 1405 99.1 F (37.3 C)     Temp Source 08/17/23 1405 Oral     SpO2 08/17/23 1405 95 %     Weight --      Height --      Head Circumference --      Peak Flow --      Pain Score 08/17/23 1404 8     Pain Loc --      Pain Education --      Exclude from Growth Chart --    No data  found.  Updated Vital Signs BP 118/84 (BP Location: Left Arm)   Pulse 97   Temp 99.1 F (37.3 C) (Oral)   Resp 15   SpO2 95%   Visual Acuity Right Eye Distance:   Left Eye Distance:   Bilateral Distance:    Right Eye Near:   Left Eye Near:    Bilateral Near:     Physical Exam GEN:     alert, non-toxic appearing female in no distress ***   HENT:  mucus membranes moist, oropharyngeal ***without lesions or ***erythema,  no*** tonsillar hypertrophy or exudates, *** moderate erythematous edematous turbinates, ***clear nasal discharge, ***bilateral TM normal EYES:   pupils equal and reactive, ***no scleral injection or discharge NECK:  normal ROM, no ***lymphadenopathy, ***no meningismus   RESP:  no increased work of breathing, ***clear to auscultation bilaterally CVS:   regular rate ***and rhythm Skin:   warm and dry, no rash on visible skin***    UC Treatments / Results  Labs (all labs ordered are listed, but only abnormal results are displayed) Labs Reviewed  RESP PANEL BY RT-PCR (FLU A&B, COVID) [id number]    EKG   Radiology No results found.  Procedures Procedures (including critical care time)  Medications Ordered in UC Medications - No data to display  Initial Impression / Assessment and Plan / UC Course  I have reviewed the triage vital signs and the nursing notes.  Pertinent labs & imaging results that were available during my care of the patient were reviewed by me and considered in my medical decision making (see chart for details).       Pt is a 63 y.o. female who presents for *** days of respiratory symptoms. Vivienne is ***afebrile here without recent antipyretics. Satting well on room air. Overall pt is ***non-toxic appearing, well hydrated, without respiratory distress. Pulmonary exam ***is unremarkable.  COVID and influenza panel obtained ***and was negative. ***Pt to quarantine until COVID test results or longer if positive.  I will call patient with  test results, if positive. History consistent with ***viral respiratory illness. Discussed symptomatic treatment.  Explained lack of efficacy of antibiotics in viral disease.  Typical duration of symptoms discussed.   Return and ED precautions given and voiced understanding. Discussed MDM, treatment plan and plan for follow-up with patient*** who agrees with plan.     Final Clinical Impressions(s) / UC Diagnoses   Final diagnoses:  None   Discharge Instructions   None    ED Prescriptions   None    PDMP not reviewed this encounter.

## 2023-08-17 NOTE — ED Triage Notes (Signed)
 Sx since Sat night  Bodyaches Headache Chills Post nasal drip cough

## 2023-10-05 ENCOUNTER — Ambulatory Visit: Admitting: Podiatry

## 2023-10-05 ENCOUNTER — Encounter: Payer: Self-pay | Admitting: Podiatry

## 2023-10-05 VITALS — Ht 66.0 in | Wt 214.0 lb

## 2023-10-05 DIAGNOSIS — R200 Anesthesia of skin: Secondary | ICD-10-CM

## 2023-10-05 MED ORDER — PREGABALIN 50 MG PO CAPS: 50.0000 mg | ORAL_CAPSULE | Freq: Three times a day (TID) | ORAL | 3 refills | Status: DC

## 2023-10-05 NOTE — Progress Notes (Signed)
 Sharon [name] presents today for a postop visit date of surgery consisting of a Keller arthroplasty with implant sesamoidectomy and a neurectomy to the third interdigital space which has resulted in numbness to the distal aspect of her right foot from the most proximal area of the surgical scar of the hallux and first metatarsal all the way across the foot distally.  She states that this area is numb but it is painful.  She states that is numb circumferentially and every toe is completely numb.  She states that it seems to be progressing proximally now moving past the initial incision sites.  She states that if she is on her feet it starts to affect her ability to continue shopping or working with swelling and ultimately pain.  She states that she has had only 1 other neurological issue in her left foot while she was driving which left her with left lower extremity paralysis for 24 hours before returning completely to normal.  She states that she was not able to take the gabapentin  because it kept her awake and "really was not doing anything".  Objective: Vital signs are stable alert and oriented x 3.  Pulses are palpable.  She has restricted range of motion of the first metatarsophalangeal joint due to scar tissue most likely.  But she states that she is actively unable to raise her toes or bend her toes down.  She has some tenderness on palpation of the fourth interdigital space but otherwise all of the remaining toes are not and nontender.  There is no discoloration or temperature change when compared to the contralateral foot.  She walks with an antalgic gait.  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  Plan: Discussed etiology pathology conservative versus surgical therapies at this point I like to engage neurology for evaluation and possible treatment I would also request an EMG.  I will start her on Lyrica  and lieu of gabapentin , 50 mg by mouth twice daily.  I will  follow-up with her in a few weeks once neurology has completed their evaluation.

## 2023-11-16 NOTE — Procedures (Signed)
 [facility] - Neurology Department [address]  [place], [place] [address] [phone] (Phone);  [phone] (Fax) Test Date:  11/09/2023  Patient: Sharon Eckles DOB: 24-Oct-1960 Physician: Dr. Arthea Farrow  Chart#: [id number] Sex: Female Ref Phys: Dr. Verta   Patient History: Patient is a 63 year-old female who presents with right foot numbness and tingling, pain.  Had foot surgery in 11/8 that ended with her having a implant put in the big toe.  Some mild back pain.  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    Exam: Decreased sensation to all modalities in a length dependent gradient in the lower extremities.    EMG & NCV Findings: Evaluation of the Right peroneal motor and the Left sural sensory nerves showed reduced amplitude (R1.9, L4.7 V).  The Left tibial motor nerve showed no response (Knee).  The Left superficial peroneal sensory and the Right superficial peroneal sensory nerves showed no response (14 cm).  The Right sural sensory nerve showed no response (Calf).  All remaining nerves (as indicated in the following tables) were within normal limits.   EMG   Side Muscle Nerve Root Ins Act Fibs Psw Amp Dur Poly Recrt Int Bruna Comment  Left Gastroc Tibial S1-2 Nml Nml Nml Nml Nml 0 Nml Nml   Left AntTibialis Dp Br Peron L4-5 Nml Nml Nml Nml Nml 0 Nml Nml   Left Peroneus Long Sup Br Peron L5-S1 Nml Nml Nml Nml Nml 0 Nml Nml   Left VastusLat Femoral L2-4 Nml Nml Nml Nml Nml 0 Nml Nml   Left TensorFascLat SupGluteal L4-5, S1 Nml Nml Nml Nml Nml 0 Nml Nml     Impression: Abnormal study.  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    Thank you for the referral of this patient. It was our privilege to participate in care of your patient.  Feel free to contact us  with any further questions.   _____________________________ Arthea Farrow, M.D.  Nerve Conduction Studies Anti Sensory  Summary Table   Stim Site NR Peak (ms) Norm Peak (ms) P-T Amp (V) Norm P-T Amp Site1 Site2 Dist (cm) Vel (m/s) Norm Vel (m/s)  Left Sup Peron Anti Sensory (Ant Lat Mall)  14 cm NR  <4.4  >5.0 14 cm Ant Lat Mall 14.0  >32  Right Sup Peron Anti Sensory (Ant Lat Mall)  14 cm NR  <4.4  >5.0 14 cm Ant Lat Mall 14.0  >32  Left Sural Anti Sensory (Lat Mall)  Calf    4.0 <4.0 4.7 >5.0 Calf Lat Mall 14.0 35 >35  Right Sural Anti Sensory (Lat Mall)  Calf NR  <4.0  >5.0 Calf Lat Mall 14.0  >35   Motor Summary Table   Stim Site NR Onset (ms) Norm Onset (ms) O-P Amp (mV) Norm O-P Amp Site1 Site2 Dist (cm) Vel (m/s) Norm Vel (m/s)  Left Peroneal Motor (Ext Dig Brev)  Ankle    5.2 <6.6 5.2 >2.0 B Fib Ankle 32.0 43 >38  B Fib    12.7  4.6  Poplt B Fib 10.0 83 >40  Poplt    13.9  4.4        Right Peroneal Motor (Ext Dig Brev)  Ankle    5.0 <6.6 1.9 >2.0 B Fib Ankle 34.0 49 >38  B Fib    12.0  1.8  Poplt B Fib 9.0 53 >40  Poplt    13.7  1.5  Left Tibial Motor (Abd Hall Brev)  Ankle    3.0 <6.6 6.5 >2.0 Knee Ankle 0.0  >42  Knee NR           Right Tibial Motor (Abd Hall Brev)  Ankle    5.3 <6.6 2.5 >2.0 Knee Ankle 40.0 47 >42  Knee    13.8  0.0          Waveforms:                    I have reviewed, edited and added to the note as needed to reflect my best personal medical judgment.    Dr. Arthea Farrow, MD [facility] [place], [place] Ph:  [phone] Fax:  [phone]

## 2023-11-18 ENCOUNTER — Telehealth: Payer: Self-pay

## 2023-11-18 NOTE — Telephone Encounter (Signed)
 Patient called and left a message - she is asking about the nerve conduction study results -you had referred her to Dr. Lane -she has a follow up appointment scheduled with you on 12/02/23. [phone]

## 2023-11-19 ENCOUNTER — Other Ambulatory Visit: Payer: Self-pay

## 2023-11-19 ENCOUNTER — Emergency Department

## 2023-11-19 ENCOUNTER — Emergency Department
Admission: EM | Admit: 2023-11-19 | Discharge: 2023-11-19 | Disposition: A | Discharge status: Home / Self Care | Attending: Emergency Medicine | Admitting: Emergency Medicine

## 2023-11-19 DIAGNOSIS — K253 Acute gastric ulcer without hemorrhage or perforation: Secondary | ICD-10-CM | POA: Diagnosis not present

## 2023-11-19 DIAGNOSIS — R1013 Epigastric pain: Secondary | ICD-10-CM | POA: Diagnosis present

## 2023-11-19 LAB — URINALYSIS, ROUTINE W REFLEX MICROSCOPIC
Bacteria, UA: NONE SEEN
Bilirubin Urine: NEGATIVE
Glucose, UA: NEGATIVE mg/dL
Hgb urine dipstick: NEGATIVE
Ketones, ur: NEGATIVE mg/dL
Nitrite: NEGATIVE
Protein, ur: 30 mg/dL — AB
Specific Gravity, Urine: 1.032 — ABNORMAL HIGH (ref 1.005–1.030)
WBC, UA: >50 WBC/hpf (ref 0–5)
pH: 5 (ref 5.0–8.0)

## 2023-11-19 LAB — COMPREHENSIVE METABOLIC PANEL WITH GFR
ALT: 15 U/L (ref 0–44)
AST: 15 U/L (ref 15–41)
Albumin: 3.6 g/dL (ref 3.5–5.0)
Alkaline Phosphatase: 58 U/L (ref 38–126)
Anion gap: 9 (ref 5–15)
BUN: 23 mg/dL (ref 8–23)
CO2: 25 mmol/L (ref 22–32)
Calcium: 8.9 mg/dL (ref 8.9–10.3)
Chloride: 107 mmol/L (ref 98–111)
Creatinine, Ser: 0.54 mg/dL (ref 0.44–1.00)
GFR, Estimated: >60 mL/min (ref >60)
Glucose, Bld: 99 mg/dL (ref 70–99)
Potassium: 3.9 mmol/L (ref 3.5–5.1)
Sodium: 141 mmol/L (ref 135–145)
Total Bilirubin: 0.6 mg/dL (ref 0.0–1.2)
Total Protein: 7 g/dL (ref 6.5–8.1)

## 2023-11-19 LAB — LIPASE, BLOOD: Lipase: 27 U/L (ref 11–51)

## 2023-11-19 LAB — CBC
HCT: 37.4 % (ref 36.0–46.0)
Hemoglobin: 12.3 g/dL (ref 12.0–15.0)
MCH: 28.4 pg (ref 26.0–34.0)
MCHC: 32.9 g/dL (ref 30.0–36.0)
MCV: 86.4 fL (ref 80.0–100.0)
Platelets: 255 10*3/uL (ref 150–400)
RBC: 4.33 MIL/uL (ref 3.87–5.11)
RDW: 12.8 % (ref 11.5–15.5)
WBC: 7.1 10*3/uL (ref 4.0–10.5)
nRBC: 0 % (ref 0.0–0.2)

## 2023-11-19 MED ORDER — SODIUM CHLORIDE 0.9 % IV BOLUS
1000.0000 mL | Freq: Once | INTRAVENOUS | Status: AC
  Administered 2023-11-19: 1000 mL via INTRAVENOUS

## 2023-11-19 MED ORDER — PANTOPRAZOLE SODIUM 40 MG IV SOLR
40.0000 mg | Freq: Once | INTRAVENOUS | Status: AC
  Administered 2023-11-19: 40 mg via INTRAVENOUS
  Filled 2023-11-19: qty 10

## 2023-11-19 MED ORDER — IOHEXOL 300 MG/ML  SOLN
100.0000 mL | Freq: Once | INTRAMUSCULAR | Status: AC | PRN
  Administered 2023-11-19: 100 mL via INTRAVENOUS

## 2023-11-19 MED ORDER — FENTANYL CITRATE PF 50 MCG/ML IJ SOSY
50.0000 ug | PREFILLED_SYRINGE | Freq: Once | INTRAMUSCULAR | Status: AC
  Administered 2023-11-19: 50 ug via INTRAVENOUS
  Filled 2023-11-19: qty 1

## 2023-11-19 MED ORDER — PANTOPRAZOLE SODIUM 40 MG PO TBEC
40.0000 mg | DELAYED_RELEASE_TABLET | Freq: Every day | ORAL | 1 refills | Status: DC
Start: 2023-11-19 — End: 2024-03-15

## 2023-11-19 MED ORDER — ALUM & MAG HYDROXIDE-SIMETH 200-200-20 MG/5ML PO SUSP
30.0000 mL | Freq: Once | ORAL | Status: AC
  Administered 2023-11-19: 30 mL via ORAL
  Filled 2023-11-19: qty 30

## 2023-11-19 MED ORDER — SUCRALFATE 1 G PO TABS
1.0000 g | ORAL_TABLET | Freq: Three times a day (TID) | ORAL | 1 refills | Status: AC
Start: 2023-11-19 — End: 2024-11-18

## 2023-11-19 NOTE — ED Provider Notes (Signed)
 [facility] Provider Note    Event Date/Time   First MD Initiated Contact with Patient 11/19/23 1925     (approximate)   History   Abdominal Pain   HPI  Sharon [name] is a 63 y.o. female who presents to the emergency department today because of concerns for abdominal pain.  Symptoms started 3 days ago.  Initially the pain was only after she tried eating or drinking although today it was worse and has been more present.  Pain is located in the epigastric region.  It does radiate to her back.  It reminds her of when she had gallstones and had her gallbladder out.  Also has history of gastric bypass surgery.  Is not currently on any antacids.     Physical Exam   Triage Vital Signs: ED Triage Vitals [11/19/23 1753]  Encounter Vitals Group     BP 134/84     Girls Systolic BP Percentile      Girls Diastolic BP Percentile      Boys Systolic BP Percentile      Boys Diastolic BP Percentile      Pulse Rate 97     Resp 18     Temp 98.2 F (36.8 C)     Temp src      SpO2 99 %     Weight      Height      Head Circumference      Peak Flow      Pain Score      Pain Loc      Pain Education      Exclude from Growth Chart     Most recent vital signs: Vitals:   11/19/23 1753  BP: 134/84  Pulse: 97  Resp: 18  Temp: 98.2 F (36.8 C)  SpO2: 99%   General: Awake, alert, oriented. CV:  Good peripheral perfusion. Regular rate and rhythm. Resp:  Normal effort. Lungs clear. Abd:  No distention. Minimally tender in the epigastric region.   ED Results / Procedures / Treatments   Labs (all labs ordered are listed, but only abnormal results are displayed) Labs Reviewed  URINALYSIS, ROUTINE W REFLEX MICROSCOPIC - Abnormal; Notable for the following components:      Result Value   Color, Urine YELLOW (*)    APPearance CLOUDY (*)    Specific Gravity, Urine 1.032 (*)    Protein, ur 30 (*)    Leukocytes,Ua MODERATE (*)    All other components within  normal limits  LIPASE, BLOOD  COMPREHENSIVE METABOLIC PANEL WITH GFR  CBC     EKG  I, Guadalupe Eagles, attending physician, personally viewed and interpreted this EKG  EKG Time: 1801 Rate: 79 Rhythm: normal sinus rhythm Axis: normal Intervals: qtc 424 QRS: narrow ST changes: no st elevation Impression: normal ekg  RADIOLOGY I independently interpreted and visualized the CT abd/pel. My interpretation: No free air Radiology interpretation:  IMPRESSION:  1. Postoperative change of Roux-en-Y gastric bypass. Thinning and  focal outpouching of the wall of the gastric pouch concerning for  ulcer. This abuts the inflamed wall of the excluded stomach. No  definite gastro gastric fistula however this could be further  evaluated with fluoroscopic upper GI study.  2. Hepatic steatosis.      PROCEDURES:  Critical Care performed: No    MEDICATIONS ORDERED IN ED: Medications - No data to display   IMPRESSION / MDM / ASSESSMENT AND PLAN / ED COURSE  I reviewed the  triage vital signs and the nursing notes.                              Differential diagnosis includes, but is not limited to, SBO, gastroenteritis, biliary stone, ulcer  Patient's presentation is most consistent with acute presentation with potential threat to life or bodily function.   Patient presented to the emergency department today because of concerns for abdominal pain.  Patient with history of gastric bypass surgery.  Blood work here without concerning leukocytosis.  CT scan was obtained which was concerning for ulcer to the gastric pouch.  I do think this would explain the patient's symptoms.  Patient was given antacids here and did feel some improvement.  Will plan on discharging to follow-up with GI.     FINAL CLINICAL IMPRESSION(S) / ED DIAGNOSES   Final diagnoses:  Epigastric pain  Acute gastric ulcer, unspecified whether gastric ulcer hemorrhage or perforation present    Note:  This document  was prepared using Dragon voice recognition software and may include unintentional dictation errors.    Floy Roberts, MD 11/19/23 2155

## 2023-11-19 NOTE — ED Notes (Signed)
 Patient transported to CT

## 2023-11-19 NOTE — ED Notes (Signed)
 Discharge instructions reviewed.   Newly prescribed medications discussed. Pharmacy verified.   Opportunity for questions and concerns provided.   Alert, oriented and ambulatory.   Displays no signs of distress.   Escorted home by daughter.

## 2023-11-19 NOTE — ED Triage Notes (Addendum)
 Pt comes with epigastric pain that started on Monday. Pt states pain after eating.  Pt states left shoulder and back pain. Pt states anything she eats or drinks is instant pain.  Pt denies any N/V.

## 2023-11-21 LAB — URINE CULTURE: Culture: >=100000 — AB

## 2023-11-22 NOTE — Progress Notes (Addendum)
 ED Antimicrobial Stewardship Positive Culture Follow Up   Sharon [name] is an 63 y.o. female who presented to [facility] on 11/19/2023 with a chief complaint of  Chief Complaint  Patient presents with   Abdominal Pain  No urinary symptoms or complaints noted,  WBC 7.1  Afebrile   Recent Results (from the past 720 hours)  Urine Culture     Status: Abnormal   Collection Time: 11/19/23  6:06 PM   Specimen: Urine, Clean Catch  Result Value Ref Range Status   Specimen Description   Final    URINE, CLEAN CATCH Performed at [facility], [address] [place], [place] [address]    Special Requests   Final    NONE Performed at [facility], [address] [place], [place] [address]    Culture >=100,000 COLONIES/mL ESCHERICHIA COLI (A)  Final   Report Status 11/21/2023 FINAL  Final   Organism ID, Bacteria ESCHERICHIA COLI (A)  Final      Susceptibility   Escherichia coli - MIC*    AMPICILLIN 4 SENSITIVE Sensitive     CEFAZOLIN <=4 SENSITIVE Sensitive     CEFEPIME <=0.12 SENSITIVE Sensitive     CEFTRIAXONE  <=0.25 SENSITIVE Sensitive     CIPROFLOXACIN  <=0.25 SENSITIVE Sensitive     GENTAMICIN <=1 SENSITIVE Sensitive     IMIPENEM <=0.25 SENSITIVE Sensitive     NITROFURANTOIN  <=16 SENSITIVE Sensitive     TRIMETH/SULFA <=20 SENSITIVE Sensitive     AMPICILLIN/SULBACTAM <=2 SENSITIVE Sensitive     PIP/TAZO <=4 SENSITIVE Sensitive ug/mL    * >=100,000 COLONIES/mL ESCHERICHIA COLI     [x]  Patient discharged originally without antimicrobial agent and treatment may now be indicated  New antibiotic prescription (if patient has developed symptoms per MD): cephalexin 500mg  po BID x 5 days  ED Provider: Guadalupe Eagles  -11/22/23 1515  Called pt # listed in chart [phone]. Left Voicemail   -11/22/23 1550  Called and was able to speak to patient. She has had no urinary symptoms, no fever. Was just able to get medication on 7/5 that was prescribed for GI issue.  Still w/ abd pain.  Did not call in abx Rx at this time   [name] A 11/22/2023, 3:28 PM Clinical Pharmacist

## 2023-11-26 ENCOUNTER — Telehealth: Payer: Self-pay | Admitting: Emergency Medicine

## 2023-11-26 ENCOUNTER — Telehealth: Payer: Self-pay

## 2023-11-26 NOTE — Telephone Encounter (Signed)
 Pateitn had left message asking about her urine culture and whether she needs antibiotics due to ecoli.  I called her back yesterday, but we were unable to connect yesterday.  She called me back today--I explained that the pharmacist had already discussed this with the ED physician and they did not want antibiotics.  She does not have a pcp. She has a urologist dr penne for fairly frequent utis.  I told her that she can try to do follow up with urology, but that she should not wait too long.  I told her that she can do her ED follow up at [facility] walk in.  She will be seen either there or at urology.

## 2023-11-26 NOTE — Telephone Encounter (Signed)
 Pt called the triage line stating she would like for Dr.Brandon to send in antibiotics. Pt was informed that Dr.Brandon is not working with us  anymore. Pt was seen in the ER on 11/19/2023, MD did not send antibiotics. Pt was advise to go to urgent care because our next opening is in two weeks. Pt states she does not have UTI symptoms. Pt states she feel some pressure after urinating but feels like she is emptying well. Appt was made with one of our PAs for 12/08/2023, pt was advise to go to the ER or urgent care if she feels like is is not emptying well or develops UTI symptoms. Pt voiced understanding.

## 2023-11-27 ENCOUNTER — Ambulatory Visit
Admission: EM | Admit: 2023-11-27 | Discharge: 2023-11-27 | Disposition: A | Discharge status: Home / Self Care | Attending: Family Medicine | Admitting: Family Medicine

## 2023-11-27 ENCOUNTER — Encounter: Payer: Self-pay | Admitting: Emergency Medicine

## 2023-11-27 DIAGNOSIS — N3001 Acute cystitis with hematuria: Secondary | ICD-10-CM | POA: Insufficient documentation

## 2023-11-27 DIAGNOSIS — R1030 Lower abdominal pain, unspecified: Secondary | ICD-10-CM | POA: Insufficient documentation

## 2023-11-27 LAB — URINALYSIS, W/ REFLEX TO CULTURE (INFECTION SUSPECTED)
Glucose, UA: NEGATIVE mg/dL
Ketones, ur: NEGATIVE mg/dL
Nitrite: NEGATIVE
Protein, ur: 100 mg/dL — AB
Specific Gravity, Urine: >1.03 — ABNORMAL HIGH (ref 1.005–1.030)
WBC, UA: >50 WBC/hpf (ref 0–5)
pH: 5.5 (ref 5.0–8.0)

## 2023-11-27 MED ORDER — NITROFURANTOIN MONOHYD MACRO 100 MG PO CAPS: 100.0000 mg | ORAL_CAPSULE | Freq: Two times a day (BID) | ORAL | 0 refills | Status: DC

## 2023-11-27 MED ORDER — CEFTRIAXONE SODIUM 1 G IJ SOLR
1.0000 g | Freq: Once | INTRAMUSCULAR | Status: AC
  Administered 2023-11-27: 1 g via INTRAMUSCULAR

## 2023-11-27 NOTE — ED Provider Notes (Signed)
 [facility]    CSN: [id number] Arrival date & time: 11/27/23  1856      History   Chief Complaint Chief Complaint  Patient presents with   Abdominal Pain     HPI HPI Sharon [name] is a 63 y.o. female.    Sharon [name] presents for abdominal pain and concern for UTI. Last Thursday, she got sick at work and was evaluated at the ED.  She was told told she has an ulcer.  She had a urine test that said she may have a UTI but they didn't treat her because she didn't have any symptoms.  However, Wednesday she started having symptoms.  Has bladder pressure and urinary frequency with small amounts of urine.  Has been taking AZO.  Has history of kidney stones. Did noticed that she had some blood on the toilet paper when she wiped.    - Abnormal vaginal discharge: no - vaginal odor: no - vaginal bleeding: no - Dysuria: no - Hematuria: yes  - Urinary urgency: yes  - Urinary frequency: yes  - Fever: no - Abdominal pain: yes  - Pelvic pain: no - Rash/Skin lesions/mouth ulcers: no - Nausea: no  - Vomiting: no  - Back Pain: no        Past Medical History:  Diagnosis Date   Acid reflux    H/O PRIOR TO GASTRC BYPASS   Anemia    H/O   Anxiety    Arthritis    Bladder spasm    Depression    Headache    H/O MIGRAINES   History of kidney stones    Left ureteral stone    Leg DVT (deep venous thromboembolism), acute (HCC)    x 2-both times after a surgery-went to see hematologist 2017 (Dr Jacobo)  and no clotting disorder ever found   Restless leg syndrome    Sepsis (HCC)    FROM KIDNEY STONES   Sleep apnea    HAD GASTRIC BYPASS AND DOES NOT HAVE SLEEP APNEA   Urge incontinence     Patient Active Problem List   Diagnosis Date Noted   Dysuria 07/22/2023   Preoperative evaluation to rule out surgical contraindication 12/02/2021   Pulmonary nodules 12/02/2021   Allergic rhinitis 07/04/2021   Allergic rhinitis due to animal (cat) (dog) hair and  dander 07/04/2021   Allergic rhinitis due to pollen 07/04/2021   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/2021   Urticaria 07/04/2021   Acquired pes planus of left foot 07/01/2021   Pain in left foot 07/01/2021   Tendinitis of left foot 07/01/2021   Anxiety 03/30/2019   Depression 03/30/2019   Migraine 03/30/2019   Marginal ulcer 03/24/2017   S/P gastric bypass 03/24/2017   Sepsis (HCC) 11/15/2016   Hydronephrosis 11/15/2016   GERD (gastroesophageal reflux disease) 11/15/2016   Acute UTI 11/15/2016   Right leg DVT (HCC) 01/24/2016   Acute deep vein thrombosis (DVT) of popliteal vein of right lower extremity (HCC) 01/04/2016   Incomplete tear of right rotator cuff 11/22/2015   Moderate obstructive sleep apnea 09/21/2015   Kidney stones 09/10/2015   Urge incontinence 09/10/2015   Nocturia 09/10/2015   Joint pain 09/05/2015   S/P laparoscopic cholecystectomy 09/05/2015   Paralabral cyst of shoulder, right, subsequent encounter 01/10/2015   Rotator cuff tendinitis, right 01/10/2015   Tear of right glenoid labrum 01/10/2015   Acute ankle pain 11/16/2013   Acute shoulder pain 11/16/2013    Past Surgical History:  Procedure Laterality Date  ABDOMINAL HYSTERECTOMY     TOTAL ABDOMINAL HYSTERECTOMY W/ BILATERAL SALPINGOOPHORECTOMY   BARIATRIC SURGERY  05/15/2016   CHOLECYSTECTOMY     ESOPHAGOGASTRODUODENOSCOPY  05/15/2016   KIDNEY STONE SURGERY     KNEE ARTHROSCOPY Right    X 2   KNEE ARTHROSCOPY Right 11/11/2018   Procedure: Partial right knee arthroscopy,arthroscopic medial and lateral menisectomy, plica excision;  Surgeon: Kathlynn Sharper, MD;  Location: [facility];  Service: Orthopedics;  Laterality: Right;   KNEE SURGERY Left 2006   SHOULDER ARTHROSCOPY WITH OPEN ROTATOR CUFF REPAIR Right 11/22/2015   Procedure: SHOULDER ARTHROSCOPY, DEBRIDEMENT, DECOMPRESSION, SLAP REPAIR, POSSIBLE BICEP TENODESIS;  Surgeon: Norleen JINNY Maltos, MD;  Location: [facility] OR[facility];  Service: Orthopedics;  Laterality:  Right;   SINUS EXPLORATION      OB History   No obstetric history on file.      Home Medications    Prior to Admission medications   Medication Sig Start Date End Date Taking? Authorizing Provider  nitrofurantoin , macrocrystal-monohydrate, (MACROBID ) 100 MG capsule Take 1 capsule (100 mg total) by mouth 2 (two) times daily. 11/27/23  Yes Marina Boerner, DO  albuterol  (VENTOLIN  HFA) 108 (90 Base) MCG/ACT inhaler Inhale 2 puffs into the lungs every 4 (four) hours as needed. 08/17/23   Stacia Feazell, DO  levocetirizine (XYZAL) 5 MG tablet SMARTSIG:1 Tablet(s) By Mouth Every Evening 03/04/23   [provider]  montelukast  (SINGULAIR ) 10 MG tablet Take 10 mg by mouth at bedtime.    [provider]  pantoprazole  (PROTONIX ) 40 MG tablet Take 1 tablet (40 mg total) by mouth daily. 11/19/23 11/18/24  Goodman, Graydon, MD  phentermine (ADIPEX-P) 37.5 MG tablet Take 37.5 mg by mouth daily. 07/31/23   [provider]  pregabalin  (LYRICA ) 50 MG capsule Take 1 capsule (50 mg total) by mouth 3 (three) times daily. 10/05/23   Hyatt, Max T, DPM  sucralfate  (CARAFATE ) 1 g tablet Take 1 tablet (1 g total) by mouth 4 (four) times daily -  with meals and at bedtime. 11/19/23 11/18/24  Floy Roberts, MD  triamcinolone  (NASACORT ) 55 MCG/ACT AERO nasal inhaler 1 spray in each nostril    [provider]  apixaban  (ELIQUIS ) 5 MG TABS tablet Take 1 tablet (5 mg total) by mouth 2 (two) times daily. 11/11/18 04/15/19  Kathlynn Sharper, MD    Family History Family History  Problem Relation Age of Onset   Cancer Mother        rectal   Lung cancer Father    Hematuria Brother    Prostate cancer Brother    Kidney Stones Brother    Kidney disease Paternal Grandfather    Bladder Cancer Neg Hx    Kidney cancer Neg Hx     Social History Social History   Tobacco Use   Smoking status: Never   Smokeless tobacco: Never  Vaping Use   Vaping status: Never Used  Substance Use Topics    Alcohol use: No    Alcohol/week: 0.0 standard drinks of alcohol   Drug use: No     Allergies   Alcohol, Augmentin  [amoxicillin -pot clavulanate], Bacitracin-neomycin-polymyxin, Bacitracin-polymyxin b, Codeine, Hydrocodone, Hydrocortisone, Lanolin, Neomycin, Norepinephrine bitartrate, Other, Oxycodone-acetaminophen , Penicillin g sodium, Tape, Tapentadol, Tramadol, Tramadol hcl, Trimethoprim, Wound dressings, Bacitracin, Clarithromycin, Latex, Nickel, Oxycodone, Penicillins, and Sulfamethoxazole-trimethoprim   Review of Systems Review of Systems: :negative unless otherwise stated in HPI.      Physical Exam Triage Vital Signs ED Triage Vitals  Encounter Vitals Group     BP 11/27/23 1923 107/75  Girls Systolic BP Percentile --      Girls Diastolic BP Percentile --      Boys Systolic BP Percentile --      Boys Diastolic BP Percentile --      Pulse Rate 11/27/23 1923 85     Resp 11/27/23 1923 16     Temp 11/27/23 1923 97.8 F (36.6 C)     Temp Source 11/27/23 1923 Oral     SpO2 11/27/23 1923 98 %     Weight 11/27/23 1921 214 lb 1.1 oz (97.1 kg)     Height 11/27/23 1921 5' 6 (1.676 m)     Head Circumference --      Peak Flow --      Pain Score 11/27/23 1921 8     Pain Loc --      Pain Education --      Exclude from Growth Chart --    No data found.  Updated Vital Signs BP 107/75 (BP Location: Right Arm)   Pulse 85   Temp 97.8 F (36.6 C) (Oral)   Resp 16   Ht 5' 6 (1.676 m)   Wt 97.1 kg   SpO2 98%   BMI 34.55 kg/m   Visual Acuity Right Eye Distance:   Left Eye Distance:   Bilateral Distance:    Right Eye Near:   Left Eye Near:    Bilateral Near:     Physical Exam GEN: well appearing female in no acute distress  CVS: well perfused  RESP: speaking in full sentences without pause      UC Treatments / Results  Labs (all labs ordered are listed, but only abnormal results are displayed) Labs Reviewed  URINE CULTURE - Abnormal; Notable for the following  components:      Result Value   Culture MULTIPLE SPECIES PRESENT, SUGGEST RECOLLECTION (*)    All other components within normal limits  URINALYSIS, W/ REFLEX TO CULTURE (INFECTION SUSPECTED) - Abnormal; Notable for the following components:   APPearance HAZY (*)    Specific Gravity, Urine >1.030 (*)    Hgb urine dipstick SMALL (*)    Bilirubin Urine SMALL (*)    Protein, ur 100 (*)    Leukocytes,Ua SMALL (*)    Non Squamous Epithelial PRESENT (*)    Bacteria, UA MANY (*)    All other components within normal limits    EKG   Radiology No results found.  Procedures Procedures (including critical care time)  Medications Ordered in UC Medications  cefTRIAXone  (ROCEPHIN ) injection 1 g (1 g Intramuscular Given 11/27/23 1956)    Initial Impression / Assessment and Plan / UC Course  I have reviewed the triage vital signs and the nursing notes.  Pertinent labs & imaging results that were available during my care of the patient were reviewed by me and considered in my medical decision making (see chart for details).       Acute cystitis:  Patient is a 63 y.o. female  who presents for lower abdominal pain and urinary urgency that started 3 days ago.  Overall patient is well-appearing and afebrile.  Vital signs stable.  UA consistent with acute cystitis.  Hematuria supported on microscopy.  Given Rocephin  IM 1 g as she wanted quick relief.  Treat with Macrobid  2 times daily for 5 days.   Urine culture obtained.  Follow-up sensitivities and change/stop antibiotics, if needed.   - Return precautions including abdominal pain, fever, chills, nausea, or vomiting given. Follow-up,  if symptoms  not improving or getting worse. Discussed MDM, treatment plan and plan for follow-up with patient who agrees with plan.        Final Clinical Impressions(s) / UC Diagnoses   Final diagnoses:  Acute cystitis with hematuria  Lower abdominal pain     Discharge Instructions      Stop by  the pharmacy to pick up your prescriptions.  Follow up with your primary care provider or return to the urgent care, if not improving.        ED Prescriptions     Medication Sig Dispense Auth. Provider   nitrofurantoin , macrocrystal-monohydrate, (MACROBID ) 100 MG capsule Take 1 capsule (100 mg total) by mouth 2 (two) times daily. 10 capsule Jailan Trimm, DO      PDMP not reviewed this encounter.   Hibah Odonnell, DO 11/30/23 1047

## 2023-11-27 NOTE — ED Triage Notes (Signed)
 Patient states that she was seen in the ED on 11/19/23.  Patient was treated for stomach ulcer.  Patient reports bladder pressure and urinary urgency that started Tuesday night.

## 2023-11-27 NOTE — Discharge Instructions (Addendum)
 Stop by the pharmacy to pick up your prescriptions.  Follow up with your primary care provider or return to the urgent care, if not improving.

## 2023-11-29 LAB — URINE CULTURE

## 2023-11-30 ENCOUNTER — Ambulatory Visit (HOSPITAL_COMMUNITY): Payer: Self-pay

## 2023-12-02 ENCOUNTER — Ambulatory Visit: Admitting: Podiatry

## 2023-12-08 ENCOUNTER — Ambulatory Visit: Admitting: Physician Assistant

## 2024-02-09 ENCOUNTER — Other Ambulatory Visit: Payer: Self-pay

## 2024-02-09 ENCOUNTER — Encounter: Payer: Self-pay | Admitting: Family Medicine

## 2024-02-09 ENCOUNTER — Ambulatory Visit: Admitting: Family Medicine

## 2024-02-09 VITALS — BP 138/81 | HR 81 | Temp 98.0°F | Resp 18 | Ht 66.0 in | Wt 208.0 lb

## 2024-02-09 DIAGNOSIS — R262 Difficulty in walking, not elsewhere classified: Secondary | ICD-10-CM | POA: Insufficient documentation

## 2024-02-09 DIAGNOSIS — K289 Gastrojejunal ulcer, unspecified as acute or chronic, without hemorrhage or perforation: Secondary | ICD-10-CM

## 2024-02-09 NOTE — Assessment & Plan Note (Signed)
 Right foot pain that she is unable to walk significant distances.  Filled out a handicap placard form for her.  Recommended she try Voltaren gel, Salonpas and ice to see if she can improve her inflammation and pain level.

## 2024-02-09 NOTE — Progress Notes (Signed)
 New Patient Office Visit  Subjective    Patient ID: Sharon [name], female    DOB: September 06, 1960  Age: 63 y.o. MRN: [id number]  CC:  Chief Complaint  Patient presents with   Establish Care   Numbness    Right foot   stomach ucler    HPI [name] presents to establish care Discussed the use of AI scribe software for clinical note transcription with the patient, who gave verbal consent to proceed.  History of Present Illness   Sharon [name] is a 63 year old female with a history of gastric bypass who presents for evaluation of a suspected recurrent gastric ulcer and to establish care.    She underwent a full gastric bypass in December 2017 and developed a marginal ulcer by January-February 2018, which was treated with medication. Initially, she adhered to the recommended vitamin regimen but switched to [employer] vitamins in 2018 due to pill size. She thinks that she has an ulcer again because of pain.  She is scheduled for an endoscopy on February 26, 2024. She denies nay black of tarry stools.  She has not vomited.  Current medications include sucralfate  and omeprazole for ulcer management.  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   She has a history of pulmonary nodules, which have shown no recent growth on scans, and she no longer follows up with a lung specialist unless issues arise. No smoking, alcohol, or drug use.  She has osteoporosis and receives Prolia injections twice a year (in [place]) with monitoring of her vitamin D levels, which have been low in the past.  She has a history of migraines, which have improved, and sleep apnea, which is no longer an issue.   She has experienced multiple falls due to numbness and tingling in her right foot following foot surgery in November 2024, which involved removal of a morton's neuroma and bunion  surgery. She reports persistent pain and swelling in the foot, impacting her ability to work and perform daily activities. She is taking nortriptyline and Lyrica  for neuropathy and pain management. 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  Discussed topical anti inflammatories like Salonas (pain patch) and Voltaren gel    She has a history of DVTs following surgeries, for which she received anticoagulation therapy. She has also had multiple surgeries, including gallbladder removal, shoulder surgery, and knee surgeries. She reports allergies to several medications, including narcotics, Neosporin, cortisone cream, and lanolin.  She feels discouraged and uncertain about her ability to continue working due to her foot pain and mobility issues. She asked for  a handicap placard.  Her family history includes colon cancer in her mother, who died at age 54, and lung disease in her father, who died at age 72.      Outpatient Encounter Medications as of 02/09/2024  Medication Sig   albuterol  (VENTOLIN  HFA) 108 (90 Base) MCG/ACT inhaler Inhale 2 puffs into the lungs every 4 (four) hours as needed.   levocetirizine (XYZAL) 5 MG tablet SMARTSIG:1 Tablet(s) By Mouth Every Evening   misoprostol  (CYTOTEC ) 100 MCG tablet Take 100 mcg by mouth in the morning, at noon, and at bedtime.   mometasone (ELOCON) 0.1 % cream Apply 1 Application topically daily.   montelukast  (SINGULAIR ) 10 MG tablet Take 10 mg by mouth at bedtime.  nortriptyline (PAMELOR) 10 MG capsule Take 10 mg by mouth at bedtime.   Olopatadine HCl (PATADAY) 0.2 % SOLN Apply 1 drop to eye daily.   omeprazole (PRILOSEC) 40 MG capsule Take 40 mg by mouth 2 (two) times daily.   pantoprazole  (PROTONIX ) 40 MG tablet Take 1 tablet (40 mg total) by mouth daily.   pregabalin  (LYRICA ) 50 MG capsule Take 1 capsule (50 mg total) by mouth 3 (three) times daily.   sucralfate  (CARAFATE ) 1 g tablet Take 1  tablet (1 g total) by mouth 4 (four) times daily -  with meals and at bedtime.   triamcinolone  (NASACORT ) 55 MCG/ACT AERO nasal inhaler 1 spray in each nostril   nitrofurantoin , macrocrystal-monohydrate, (MACROBID ) 100 MG capsule Take 1 capsule (100 mg total) by mouth 2 (two) times daily.   phentermine (ADIPEX-P) 37.5 MG tablet Take 37.5 mg by mouth daily.   [DISCONTINUED] apixaban  (ELIQUIS ) 5 MG TABS tablet Take 1 tablet (5 mg total) by mouth 2 (two) times daily.   No facility-administered encounter medications on file as of 02/09/2024.    Past Medical History:  Diagnosis Date   Acid reflux    H/O PRIOR TO GASTRC BYPASS   Anemia    H/O   Anxiety    Arthritis    Bladder spasm    Depression    Headache    H/O MIGRAINES   History of kidney stones    Left ureteral stone    Leg DVT (deep venous thromboembolism), acute (HCC)    x 2-both times after a surgery-went to see hematologist 2017 (Dr Jacobo)  and no clotting disorder ever found   Osteoporosis 03/27/2023   During foot surgery my big toe was being cut and it crumpled in dr hand   Restless leg syndrome    Sepsis (HCC)    FROM KIDNEY STONES   Sleep apnea    HAD GASTRIC BYPASS AND DOES NOT HAVE SLEEP APNEA   Ulcer 11/19/2023   On meds now and I'm scheduled for endoscopy Feb 26, 2024   Urge incontinence     Past Surgical History:  Procedure Laterality Date   ABDOMINAL HYSTERECTOMY     TOTAL ABDOMINAL HYSTERECTOMY W/ BILATERAL SALPINGOOPHORECTOMY   BARIATRIC SURGERY  05/15/2016   CHOLECYSTECTOMY     ESOPHAGOGASTRODUODENOSCOPY  05/15/2016   KIDNEY STONE SURGERY     KNEE ARTHROSCOPY Right    X 2   KNEE ARTHROSCOPY Right 11/11/2018   Procedure: Partial right knee arthroscopy,arthroscopic medial and lateral menisectomy, plica excision;  Surgeon: Kathlynn Sharper, MD;  Location: [facility];  Service: Orthopedics;  Laterality: Right;   KNEE SURGERY Left 2006   SHOULDER ARTHROSCOPY WITH OPEN ROTATOR CUFF REPAIR Right 11/22/2015    Procedure: SHOULDER ARTHROSCOPY, DEBRIDEMENT, DECOMPRESSION, SLAP REPAIR, POSSIBLE BICEP TENODESIS;  Surgeon: Norleen JINNY Maltos, MD;  Location: [facility];  Service: Orthopedics;  Laterality: Right;   SINUS EXPLORATION      Family History  Problem Relation Age of Onset   Cancer Mother        rectal   Lung cancer Father    Hematuria Brother    Prostate cancer Brother    Kidney Stones Brother    Kidney disease Paternal Grandfather    Bladder Cancer Neg Hx    Kidney cancer Neg Hx     Social History   Socioeconomic History   Marital status: Divorced    Spouse name: Not on file   Number of children: Not on file   Years of education:  Not on file   Highest education level: Associate degree: academic program  Occupational History   Not on file  Tobacco Use   Smoking status: Never   Smokeless tobacco: Never  Vaping Use   Vaping status: Never Used  Substance and Sexual Activity   Alcohol use: No   Drug use: No   Sexual activity: Not Currently    Birth control/protection: None  Other Topics Concern   Not on file  Social History Narrative   Not on file   Social Drivers of Health   Financial Resource Strain: Low Risk  (02/09/2024)   Overall Financial Resource Strain (CARDIA)    Difficulty of Paying Living Expenses: Not hard at all  Food Insecurity: No Food Insecurity (02/09/2024)   Hunger Vital Sign    Worried About Running Out of Food in the Last Year: Never true    Ran Out of Food in the Last Year: Never true  Transportation Needs: No Transportation Needs (02/09/2024)   PRAPARE - Administrator, Civil Service (Medical): No    Lack of Transportation (Non-Medical): No  Physical Activity: Inactive (02/09/2024)   Exercise Vital Sign    Days of Exercise per Week: 0 days    Minutes of Exercise per Session: Not on file  Stress: Stress Concern Present (02/09/2024)   [employer] of Occupational Health - Occupational Stress Questionnaire    Feeling of Stress: To some  extent  Social Connections: Moderately Isolated (02/09/2024)   Social Connection and Isolation Panel    Frequency of Communication with Friends and Family: More than three times a week    Frequency of Social Gatherings with Friends and Family: Three times a week    Attends Religious Services: More than 4 times per year    Active Member of Clubs or Organizations: No    Attends Engineer, structural: Not on file    Marital Status: Divorced  Catering manager Violence: Not on file    ROS      Objective   BP 138/81 (BP Location: Left Arm, Patient Position: Sitting, Cuff Size: Normal)   Pulse 81   Temp 98 F (36.7 C) (Oral)   Resp 18   Ht 5' 6 (1.676 m)   Wt 208 lb (94.3 kg)   SpO2 96%   BMI 33.57 kg/m    Physical Exam Vitals and nursing note reviewed.  Constitutional:      Appearance: Normal appearance.  HENT:     Head: Normocephalic and atraumatic.  Eyes:     Conjunctiva/sclera: Conjunctivae normal.  Cardiovascular:     Rate and Rhythm: Normal rate and regular rhythm.  Pulmonary:     Effort: Pulmonary effort is normal.     Breath sounds: Normal breath sounds.  Musculoskeletal:     Right lower leg: No edema.     Left lower leg: No edema.  Feet:     Comments: Right foot is swollen and warm to touch over ball, great toe and third toe.  No erythema. Tender to touch Skin:    General: Skin is warm and dry.  Neurological:     Mental Status: She is alert and oriented to person, place, and time.  Psychiatric:        Mood and Affect: Mood normal.        Behavior: Behavior normal.        Thought Content: Thought content normal.        Judgment: Judgment normal.   She has some  The ASCVD Risk score (Arnett DK, et al., 2019) failed to calculate for the following reasons:   Cannot find a previous HDL lab   Cannot find a previous total cholesterol lab     Assessment & Plan:  Marginal ulcer Assessment & Plan: She has a history of marginal ulcer and  is being treated for an ulcer now with misoprostol , Carafate  and omeprazole.  Has FOLLOW-UP appointment with GI   Inability to walk Assessment & Plan: Right foot pain that she is unable to walk significant distances.  Filled out a handicap placard form for her.  Recommended she try Voltaren gel, Salonpas and ice to see if she can improve her inflammation and pain level.     Return in about 3 months (around 05/10/2024).   Lanie Schelling K Benedetta Sundstrom, MD

## 2024-02-09 NOTE — Assessment & Plan Note (Signed)
 She has a history of marginal ulcer and is being treated for an ulcer now with misoprostol , Carafate  and omeprazole.  Has FOLLOW-UP appointment with GI

## 2024-02-11 ENCOUNTER — Ambulatory Visit: Payer: Self-pay | Admitting: *Deleted

## 2024-02-11 NOTE — Telephone Encounter (Signed)
 Copied from CRM [id number]. Topic: Clinical - Red Word Triage >> Feb 11, 2024  1:38 PM Antony RAMAN wrote: Red Word that prompted transfer to Nurse Triage: thinks she has sinus infection with colored mucus   ----------------------------------------------------------------------- From previous Reason for Contact - Scheduling: Patient/patient representative is calling to schedule an appointment. Refer to attachments for appointment information. Reason for Disposition  [1] Sinus congestion (pressure, fullness) AND [2] present > 10 days  Answer Assessment - Initial Assessment Questions 1. LOCATION: Where does it hurt?      I'm a new pt  I was in on Monday.   I saw Dr. Ziglar.    The mucus is thick with color.   I'm for an endoscopy so I may need to antibiotics. It was yellow on Monday but it got darker and this morning it's really dark green color.    2. ONSET: When did the sinus pain start?  (e.g., hours, days)      Sunday 3. SEVERITY: How bad is the pain?   (Scale 0-10; or none, mild, moderate or severe)     I'm having headaches and facial pressure today. 4. RECURRENT SYMPTOM: Have you ever had sinus problems before? If Yes, ask: When was the last time? and What happened that time?      Yes I have have allergies. 5. NASAL CONGESTION: Is the nose blocked? If Yes, ask: Can you open it or must you breathe through your mouth?     It's stopped up 6. NASAL DISCHARGE: Do you have discharge from your nose? If so ask, What color?     No It's stopped up. 7. FEVER: Do you have a fever? If Yes, ask: What is it, how was it measured, and when did it start?      No 8. OTHER SYMPTOMS: Do you have any other symptoms? (e.g., sore throat, cough, earache, difficulty breathing)     No coughing,    My throat is irritated from the drainage going down the back of my throat. 9. PREGNANCY: Is there any chance you are pregnant? When was your last menstrual period?     N/A due to  age  Protocols used: Sinus Pain or Congestion-A-AH FYI Only or Action Required?: FYI only for provider.  Patient was last seen in primary care on 02/09/2024 by Ziglar, Susan K, MD.  Called Nurse Triage reporting Sinusitis.For endoscopy and wanted to be treated so this is not postponed.    Symptoms began several days ago.  Interventions attempted: OTC medications: decongestants and Netti Pot without relief.  Symptoms are: gradually worseningHeadaches and facial pressure.  Triage Disposition: See PCP When Office is Open (Within 3 Days)Appt made with Dr Ziglar for 02/15/2024 at 3:10  Patient/caregiver understands and will follow disposition?: Yes

## 2024-02-13 ENCOUNTER — Other Ambulatory Visit: Payer: Self-pay | Admitting: Podiatry

## 2024-02-15 ENCOUNTER — Encounter: Payer: Self-pay | Admitting: Family Medicine

## 2024-02-15 ENCOUNTER — Ambulatory Visit: Admitting: Family Medicine

## 2024-02-15 VITALS — BP 133/88 | HR 75 | Temp 98.0°F | Resp 18 | Ht 66.0 in | Wt 209.0 lb

## 2024-02-15 DIAGNOSIS — J0110 Acute frontal sinusitis, unspecified: Secondary | ICD-10-CM

## 2024-02-15 DIAGNOSIS — J019 Acute sinusitis, unspecified: Secondary | ICD-10-CM | POA: Insufficient documentation

## 2024-02-15 MED ORDER — DOXYCYCLINE HYCLATE 100 MG PO CAPS: 100.0000 mg | ORAL_CAPSULE | Freq: Two times a day (BID) | ORAL | 0 refills | Status: DC

## 2024-02-15 NOTE — Progress Notes (Signed)
   Established Patient Office Visit  Subjective   Patient ID: Sharon [name], female    DOB: [birth year]  Age: 63 y.o. MRN: [id number]  Chief Complaint  Patient presents with   Sinusitis    Green mucus     Sinusitis   She has been having trouble with her sinuses and allergies for the last couple weeks but now she believes she has a sinus infection.  She has developed a frontal headache, sore throat and is has mucopurulent discharge from her nares.  She denies fever, chills and night sweats.  She denies nausea and is an body aches.  She denies SOB and wheezing.  She denies a cough.      ROS    Objective:     BP 133/88 (BP Location: Left Arm, Patient Position: Sitting, Cuff Size: Normal)   Pulse 75   Temp 98 F (36.7 C) (Oral)   Resp 18   Ht 5' 6 (1.676 m)   Wt 209 lb (94.8 kg)   SpO2 98%   BMI 33.73 kg/m    Physical Exam Vitals and nursing note reviewed.  Constitutional:      Appearance: Normal appearance.  HENT:     Head: Normocephalic and atraumatic.     Right Ear: Tympanic membrane, ear canal and external ear normal. There is no impacted cerumen.     Left Ear: Tympanic membrane, ear canal and external ear normal. There is no impacted cerumen.     Nose: Congestion present.     Right Turbinates: Swollen.     Left Turbinates: Swollen.     Mouth/Throat:     Pharynx: Oropharynx is clear. Posterior oropharyngeal erythema present.  Eyes:     Conjunctiva/sclera: Conjunctivae normal.  Cardiovascular:     Rate and Rhythm: Normal rate and regular rhythm.  Pulmonary:     Effort: Pulmonary effort is normal.     Breath sounds: Normal breath sounds.  Musculoskeletal:     Cervical back: Neck supple. No tenderness.  Lymphadenopathy:     Cervical: No cervical adenopathy.  Skin:    General: Skin is warm and dry.  Neurological:     Mental Status: She is alert and oriented to person, place, and time.  Psychiatric:        Mood and Affect: Mood normal.        Behavior:  Behavior normal.        Thought Content: Thought content normal.        Judgment: Judgment normal.          No results found for any visits on 02/15/24.    The ASCVD Risk score (Arnett DK, et al., 2019) failed to calculate for the following reasons:   Cannot find a previous HDL lab   Cannot find a previous total cholesterol lab    Assessment & Plan:  Acute frontal sinusitis, recurrence not specified Assessment & Plan: She is allergic to penicillins and Bactrim.  Will try doxycycline  100 mg twice daily for 10 days.  Please stay out of the sun while you are on that.  Orders: -     Doxycycline  Hyclate; Take 1 capsule (100 mg total) by mouth 2 (two) times daily.  Dispense: 20 capsule; Refill: 0     Return if symptoms worsen or fail to improve.    Leshae Mcclay K Mazey Mantell, MD

## 2024-02-15 NOTE — Assessment & Plan Note (Signed)
 She is allergic to penicillins and Bactrim.  Will try doxycycline  100 mg twice daily for 10 days.  Please stay out of the sun while you are on that.

## 2024-02-16 NOTE — Telephone Encounter (Signed)
 Patient calling again following up on RX refill for Lyrica 

## 2024-02-16 NOTE — Telephone Encounter (Signed)
Refill sent by provider

## 2024-03-08 ENCOUNTER — Telehealth: Payer: Self-pay | Admitting: Family Medicine

## 2024-03-08 NOTE — Telephone Encounter (Signed)
 Called pt to move appt up due to needing a HFU. Appt has now been scheduled for 03/15/2024 as a HFU. (40 minute appt)

## 2024-03-10 DIAGNOSIS — E538 Deficiency of other specified B group vitamins: Secondary | ICD-10-CM | POA: Insufficient documentation

## 2024-03-10 DIAGNOSIS — K922 Gastrointestinal hemorrhage, unspecified: Secondary | ICD-10-CM | POA: Insufficient documentation

## 2024-03-11 ENCOUNTER — Encounter: Payer: Self-pay | Admitting: Family Medicine

## 2024-03-15 ENCOUNTER — Other Ambulatory Visit: Payer: Self-pay | Admitting: Family Medicine

## 2024-03-15 ENCOUNTER — Encounter: Payer: Self-pay | Admitting: Family Medicine

## 2024-03-15 ENCOUNTER — Ambulatory Visit: Admitting: Family Medicine

## 2024-03-15 VITALS — BP 123/84 | HR 84 | Temp 97.8°F | Resp 18 | Ht 66.0 in | Wt 204.0 lb

## 2024-03-15 DIAGNOSIS — R7989 Other specified abnormal findings of blood chemistry: Secondary | ICD-10-CM | POA: Insufficient documentation

## 2024-03-15 DIAGNOSIS — K922 Gastrointestinal hemorrhage, unspecified: Secondary | ICD-10-CM

## 2024-03-15 DIAGNOSIS — K253 Acute gastric ulcer without hemorrhage or perforation: Secondary | ICD-10-CM

## 2024-03-15 DIAGNOSIS — K289 Gastrojejunal ulcer, unspecified as acute or chronic, without hemorrhage or perforation: Secondary | ICD-10-CM

## 2024-03-15 DIAGNOSIS — E538 Deficiency of other specified B group vitamins: Secondary | ICD-10-CM | POA: Diagnosis not present

## 2024-03-15 DIAGNOSIS — M810 Age-related osteoporosis without current pathological fracture: Secondary | ICD-10-CM | POA: Insufficient documentation

## 2024-03-15 DIAGNOSIS — K259 Gastric ulcer, unspecified as acute or chronic, without hemorrhage or perforation: Secondary | ICD-10-CM | POA: Insufficient documentation

## 2024-03-15 DIAGNOSIS — M8080XD Other osteoporosis with current pathological fracture, unspecified site, subsequent encounter for fracture with routine healing: Secondary | ICD-10-CM

## 2024-03-15 MED ORDER — PANTOPRAZOLE SODIUM 40 MG PO TBEC: 40.0000 mg | DELAYED_RELEASE_TABLET | Freq: Two times a day (BID) | ORAL | 1 refills | Status: DC

## 2024-03-15 NOTE — Assessment & Plan Note (Signed)
 She has osteoporosis with a T-score of -2.8.  She is on Prolia IV every 6 months.  Her calcium level is low during this hospitalization.  She has started vitamin D supplements at 50,000 international units weekly.  Encouraging her to take calcium 600 mg twice a day.

## 2024-03-15 NOTE — Assessment & Plan Note (Signed)
 She had a marginal gastric ulcer from her Roux-en-Y.  She was prescribed omeprazole 20 mg twice daily and pantoprazole  40 mg daily.  Stop the omeprazole 20 mg and just take the pantoprazole  40 mg daily.

## 2024-03-15 NOTE — Assessment & Plan Note (Signed)
 In the hospital her TSH was slightly low at 0.232.  Will check her TSH level in a month.

## 2024-03-15 NOTE — Progress Notes (Signed)
 Established Patient Office Visit  Subjective   Patient ID: Sharon [name], female    DOB: [birth year]  Age: 63 y.o. MRN: [id number]  Chief Complaint  Patient presents with   Hospitalization Follow-up    HPI  Discussed the use of AI scribe software for clinical note transcription with the patient, who gave verbal consent to proceed.  History of Present Illness   Sharon [name] is a 63 year old female with a history of gastric bypass who presents with concerns about vitamin deficiencies and recent rectal bleeding.  She was recently hospitalized twice, first from September 28 to February 19, 2024, for abdominal pain, and then again for rectal bleeding on 03/10/24.   A colonoscopy during her hospitalization revealed one polyp, which was sent for pathology. She has a family history of colon cancer, as her mother died at age 78 from colon cancer, prompting screenings every five years. She also has diverticulosis and hemorrhoids. She reported significant rectal bleeding, with blood present in her stool, causing discomfort.  She has a history of gastric bypass surgery, leading to multiple vitamin deficiencies, including vitamin B12 and vitamin D. Her vitamin B12 level was low at 153, and she received B12 injections during her hospital stay. She is currently taking oral B12 supplements at 1000 mcg daily, with only three pills left. She also takes vitamin D at 50,000 IU once a week. She experiences 'brain fog,' which she associates with her B12 deficiency.  She is concerned about her TSH level, which was slightly low at 0.232, and her vitamin B6 level, which was noted to be low. She is not currently taking B6 supplements. Her vitamin D level was 23, and she takes calcium chews twice daily. She uses Miralax, taking one capful daily in her coffee, to manage constipation.  She has a history of osteoporosis, with a bone density scan showing a T-score of -2.8. She is on Prolia injections every 6  months.   She reports a history of brittle bones, as evidenced by a previous foot surgery where her bone crumbled during the procedure.  She was previously on omeprazole and pantoprazole  for a marginal ulcer diagnosed on November 19, 2023, but there was confusion regarding her current medication regimen. She purchased over-the-counter omeprazole. She also takes Xyzal and montelukast  for allergies.        Objective:     BP 123/84 (BP Location: Left Arm, Patient Position: Sitting, Cuff Size: Normal)   Pulse 84   Temp 97.8 F (36.6 C) (Oral)   Resp 18   Ht 5' 6 (1.676 m)   Wt 204 lb (92.5 kg)   SpO2 98%   BMI 32.93 kg/m    Physical Exam Vitals and nursing note reviewed.  Constitutional:      Appearance: Normal appearance.  HENT:     Head: Normocephalic and atraumatic.  Eyes:     Conjunctiva/sclera: Conjunctivae normal.  Cardiovascular:     Rate and Rhythm: Normal rate and regular rhythm.  Pulmonary:     Effort: Pulmonary effort is normal.     Breath sounds: Normal breath sounds.  Musculoskeletal:     Right lower leg: No edema.     Left lower leg: No edema.  Skin:    General: Skin is warm and dry.  Neurological:     Mental Status: She is alert and oriented to person, place, and time.  Psychiatric:        Mood and Affect: Mood normal.  Behavior: Behavior normal.        Thought Content: Thought content normal.        Judgment: Judgment normal.          No results found for any visits on 03/15/24.    The ASCVD Risk score (Arnett DK, et al., 2019) failed to calculate for the following reasons:   Cannot find a previous HDL lab   Cannot find a previous total cholesterol lab    Assessment & Plan:  Marginal ulcer -     Pantoprazole  Sodium; Take 1 tablet (40 mg total) by mouth 2 (two) times daily.  Dispense: 60 tablet; Refill: 1  Acute lower GI bleeding Assessment & Plan: Has significant hemorrhoids that have bled.  Hgb dropped from 12 to 11.2.  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  Vitamin B12 was 153 in the hospital.  Advised she can have neurologic symptoms with the number that is low.  She received B12 injections during her hospitalization and is on oral supplement now.  Take your oral supplement for a month and then we will check your B12 level to see if you are capable of absorbing enough through oral supplementation.  If your B12 levels are inadequate we will switch to B12 injections monthly.   Acute gastric ulcer without hemorrhage or perforation Assessment & Plan: She had a marginal gastric ulcer from her Roux-en-Y.  She was prescribed omeprazole 20 mg twice daily and pantoprazole  40 mg daily.  Stop the omeprazole 20 mg and just take the pantoprazole  40 mg daily.   Other osteoporosis with current pathological fracture with routine healing, subsequent encounter Assessment & Plan: She has osteoporosis with a T-score of -2.8.  She is on Prolia IV every 6 months.  Her calcium level is low during this hospitalization.  She has started vitamin D supplements at 50,000 international units weekly.  Encouraging her to take calcium 600 mg twice a day.    Assessment and Plan    General Health Maintenance Does not receive flu vaccine due to past allergic reaction. On Xyzal and montelukast  for allergies, which effectively manage symptoms. - Continue Xyzal and montelukast  for allergy management.        Return in about 4 weeks (around 04/12/2024).    Daytona Hedman K Prateek Knipple, MD

## 2024-03-15 NOTE — Assessment & Plan Note (Signed)
 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  Vitamin B12 was 153 in the hospital.  Advised she can have neurologic symptoms with the number that is low.  She received B12 injections during her hospitalization and is on oral supplement now.  Take your oral supplement for a month and then we will check your B12 level to see if you are capable of absorbing enough through oral supplementation.  If your B12 levels are inadequate we will switch to B12 injections monthly.

## 2024-03-15 NOTE — Assessment & Plan Note (Signed)
 Has significant hemorrhoids that have bled.  Hgb dropped from 12 to 11.2.  Offered hemorrhoid banding as a potential treatment,

## 2024-03-16 ENCOUNTER — Other Ambulatory Visit: Payer: Self-pay | Admitting: Family Medicine

## 2024-03-16 ENCOUNTER — Encounter: Payer: Self-pay | Admitting: Family Medicine

## 2024-03-16 DIAGNOSIS — E538 Deficiency of other specified B group vitamins: Secondary | ICD-10-CM

## 2024-03-18 ENCOUNTER — Inpatient Hospital Stay: Admitting: Family Medicine

## 2024-03-22 ENCOUNTER — Other Ambulatory Visit: Payer: Self-pay

## 2024-03-22 DIAGNOSIS — E538 Deficiency of other specified B group vitamins: Secondary | ICD-10-CM

## 2024-03-23 ENCOUNTER — Ambulatory Visit

## 2024-03-23 ENCOUNTER — Telehealth: Payer: Self-pay | Admitting: Family Medicine

## 2024-03-23 NOTE — Telephone Encounter (Unsigned)
 Copied from CRM [id number]. Topic: Appointments - Scheduling Inquiry for Clinic >> Mar 21, 2024 11:24 AM Rosaria BRAVO wrote: Reason for CRM: Pt called reporting that she believes she is supposed to start taking B12 injections instead of pills. Please advise  Best contact: [phone]  Please leave voicemail  she will be working, says anytime works for her

## 2024-03-24 ENCOUNTER — Ambulatory Visit: Payer: Self-pay | Admitting: Family Medicine

## 2024-03-24 LAB — VITAMIN B12: Vitamin B-12: 765 pg/mL (ref 232–1245)

## 2024-03-25 NOTE — Telephone Encounter (Signed)
 Patient was informed that per office note from 03/15/24 that her B12 supplements may need to be switched from tablet to injections. Patient had labs drawn on 03/23/24. She will be updated once labs are resulted. Patient verbalized understanding. All questions and concerns have been addressed.

## 2024-03-30 ENCOUNTER — Inpatient Hospital Stay: Admitting: Family Medicine

## 2024-04-07 ENCOUNTER — Ambulatory Visit: Admitting: Family Medicine

## 2024-04-11 NOTE — Progress Notes (Signed)
 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 Lonell Portenier with weight loss of 75.2 lbs. 15 months from pre op (253.2 lbs., BMI 42.9) and 77%EWL based on ideal body weight of 155 lbs. (to have BMI 25) and excess weight of 98.2 lbs.  Patient is in clinic today for her long term post op nutrition follow up.       02/15/2024    9:43 AM 02/18/2024    1:58 PM 02/24/2024    9:30 AM 03/10/2024   11:28 AM 03/10/2024    8:40 PM 03/10/2024    8:45 PM 04/11/2024   12:00 PM  [place] BMI  Height 165.1 cm (5' 5) 165.1 cm (5' 5) 165.1 cm (5' 5)  167.6 cm (5' 6)  167.6 cm (5' 6)  Weight 204 lb 204 lb 203 lb 202 lb  204 lb 9.6 oz 207 lb 6.4 oz  BMI (Calculated) 33.95 33.95 33.78   33.04 33.49  Basal Metabolic Rate   1488    1510  Fat Mass   98 lb    101 lb  Fat Free Mass   105 lb    106 lb 6.4 oz  Total Body Water   82 lb 3.2 oz    81 lb 3.2 oz  Fat %   24.0-35.9    24.0-35.9  Fat Mass   33.2-58.8lb    33.6-59.6 lb     CURRENT MEDICAL CONDITIONS: Patient Active Problem List  Diagnosis  . Migraine  . Depression  . Anxiety  . Acute shoulder pain  . Acute ankle pain  . Tear of right glenoid labrum  . Rotator cuff tendinitis, right  . Paralabral cyst of shoulder, right, subsequent encounter  . Joint pain  . Acid reflux disease  . S/P laparoscopic cholecystectomy  . Moderate obstructive sleep apnea - overall AHI of 21/hr  . Incomplete tear of right rotator cuff  . Acute deep vein thrombosis (DVT) of popliteal vein of right lower extremity (CMS/HHS-HCC)  . S/P gastric bypass  . Marginal ulcer  . Acquired pes planus of left foot  . Allergic rhinitis  . Allergic rhinitis due to animal (cat) (dog) hair and dander  . Allergic rhinitis due to pollen  . Chronic allergic conjunctivitis  . Metatarsalgia of left foot  . Pain in left foot  . Urticaria  . Tendinitis of left foot  . Pulmonary nodules  .  Preoperative evaluation to rule out surgical contraindication  . Acute lower GI bleeding  . Vitamin B 12 deficiency    MEDICATIONS: Current Outpatient Medications  Medication Sig Dispense Refill  . acetaminophen  (TYLENOL ) 325 MG tablet Take by mouth Take 650 mg by mouth every 6 (six) hours as needed for moderate pain.    . albuterol  MDI, PROVENTIL , VENTOLIN , PROAIR , HFA 90 mcg/actuation inhaler Inhale 2 inhalations into the lungs every 4 (four) hours as needed (Patient not taking: Reported on 03/10/2024)    . cholecalciferol (VITAMIN D3) 1,250 mcg (50,000 unit) capsule Take 1 capsule (50,000 Units total) by mouth once a week Friday    . cyanocobalamin (VITAMIN B12) 1000 MCG tablet Take 1 tablet (1,000 mcg total) by mouth once daily 30 tablet 1  . GAVILYTE-G solution Take 4 L by mouth as directed    . levocetirizine (XYZAL) 5 MG tablet Take 1 tablet by mouth every evening (Patient not taking: Reported on 03/10/2024)    . mometasone (  ELOCON) 0.1 % cream Apply 1 Application topically once daily    . montelukast  (SINGULAIR ) 10 mg tablet Take 10 mg by mouth at bedtime  3  . nortriptyline (PAMELOR) 10 MG capsule 10 mg nightly for one week, then increase to 20 mg nightly and continue (Patient taking differently: Take 20 mg by mouth at bedtime 10 mg nightly for one week, then increase to 20 mg nightly and continue) 60 capsule 2  . omeprazole (PRILOSEC) 40 MG DR capsule TAKE 1 CAPSULE(40 MG) BY MOUTH TWICE DAILY BEFORE MEALS 60 capsule 0  . pantoprazole  (PROTONIX ) 40 MG DR tablet Take 40 mg by mouth once daily    . PATADAY 0.2 % ophthalmic solution Place 1 drop into the right eye once daily as needed for Allergies  3  . pregabalin  (LYRICA ) 50 MG capsule Take 1 capsule (50 mg total) by mouth 3 (three) times daily    . triamcinolone  (NASACORT ) 55 mcg nasal spray Place 1 spray into both nostrils once daily     No current facility-administered medications for this visit.    PAST MEDICAL HISTORY:  Past  Medical History:  Diagnosis Date  . Acid reflux   . Anxiety   . Arthritis   . Clotting disorder (HHS-HCC)   . Depression   . DVT (deep venous thrombosis) (CMS/HHS-HCC) 05/2006 12/2015   After my hysterectomy and shoulder surgery 2017  . GERD (gastroesophageal reflux disease)   . History of endometriosis   . History of kidney stones   . Kidney stone 06/2014   12/14/17, 12/16/17, 06/2014 hopitalized with sephis from 55m stone  . Migraine   . Polycystic ovary    complete hysterectomy 2008/cyst on ovaries  . Sleep apnea    uses cpap  . Urinary incontinence, mixed 09/06/2015   put on meds from Dr Brandon/Urology   PAST SURGICAL HISTORY:  Past Surgical History:  Procedure Laterality Date  . PR REMOVAL GALLBLADDER  02/2011   Gave out after losing 30 lbs in 2 mths  . ESOPHAGOGASTRODOUDENOSCOPY W/BIOPSY  10/22/2015   Procedure: ESOPHAGOGASTRODUODENOSCOPY, FLEXIBLE, TRANSORAL; WITH BIOPSY, SINGLE OR MULTIPLE;  Surgeon: Lonell Craze Portenier, MD;  Location: [facility] ENDO/BRONCH;  Service: General Surgery;;  .  Limited arthroscopic debridement, SLAP repair, arthroscopic subacromial decompression, mini-open repair of partial-thickness rotator cuff tear, and mini-open biceps tenodesis, right shoulder. Right 11/22/2015   Dr. Edie  . EGD N/A 05/15/2016   Procedure: ESOPHAGOGASTRODUODENOSCOPY, FLEXIBLE, TRANSORAL; DIAGNOSTIC, INCLUDING COLLECTION OF SPECIMEN(S) BY BRUSHING OR WASHING, WHEN PERFORMED (SEPARATE PROCEDURE);  Surgeon: Lonell Craze Fletcher, MD;  Location: [facility] OR;  Service: General Surgery;  Laterality: N/A;  . BARIATRIC SURGERY  05/15/2016   Gastric Bypass  . EGD N/A 02/23/2017   Procedure: EGD;  Surgeon: Portenier, Lonell Craze, MD;  Location: [facility] ENDO/BRONCH;  Service: General Surgery;  Laterality: N/A;  . EGD N/A 05/04/2017   Procedure: EGD;  Surgeon: Portenier, Lonell Craze, MD;  Location: [facility] ENDO/BRONCH;  Service: General Surgery;  Laterality: N/A;  . Partial right knee arthroscopy,arthroscopic  medial and lateral menisectomy, plica excision (Right) Right 11/11/2018   Dr. Kathlynn  . Foot surgery  2024  . ESOPHAGOGASTRODOUDENOSCOPY W/BIOPSY N/A 02/18/2024   Procedure: ESOPHAGOGASTRODUODENOSCOPY, FLEXIBLE, TRANSORAL; WITH BIOPSY, SINGLE OR MULTIPLE;  Surgeon: Cyrena Laneta Drown, MD;  Location: [facility] ENDO/BRONCH;  Service: Gastroenterology;  Laterality: N/A;  . COLONOSCOPY W/BIOPSY N/A 03/11/2024   Procedure: COLONOSCOPY;  Surgeon: Rudolph Rima, MD;  Location: [facility] ENDO/BRONCH;  Service: Gastroenterology;  Laterality: N/A;  . CHOLECYSTECTOMY    .  CYSTOSCOPY W/ URETEROSCOPY    . HYSTERECTOMY    . KNEE SURGERY Right    Arthroscopy x 2  . SINUS SURGERY    . TOTAL ABDOMINAL HYSTERECTOMY W/ BILATERAL SALPINGOOPHORECTOMY     For menometrorrhagia and family history of ovarian cancer   . vascular surgery leg     Diet:  Reports tolerating the stage 4 regular texture/solid food diet and reports intolerance to acidic food and spicy food at this time.  24 hour recall shows a meal plan with 3 meals and 2-3 snacks daily.  Meal volume is at 1/4-1/2 cup(s); denies feelings of hunger often.  Reports not planning meals; reports skipping meals and snacks sometimes.  Denies grazing, denies drinking with meals and denies eating mid sleep cycle.    Usual Meal Pattern: Wakes up at 6:15 AM 7:30 AM BF:  8-16 ounces regular coffee, cereal and [employer]life milk AM Snack:  skips 11:15-11:30 AM Lunch:  vegetable soup ([employer]), 1/2 PB s/w 2 PM Snack:  3/4 cup grapes or protein shake 7 PM Dinner:  [employer] chili w/ baked potato 9 PM HS Snack:  [employer] chips - 1.5 single serve bags  Hydration:  Reports drinking regular coffee and sugar free drinks; ~<64 ounces hydrating fluids daily.  Intake of alcoholic beverages:  No  Vitamin/Mineral Supplementation:  Reports taking 1 Bariatric Pal multivitamins daily; Viactiv calcium supplements daily; other supplements taken:  vitamin D2 weekly  Bowel Habits:  Reports no  issues   Physical Activity:  She is engaging in: ADLs only due to recent foot surgery    Nutrition Labs:   Need re-check of calcium, B12, albumin, TP, prealbumin, vitamin B6 and D   Weight/Weight Loss/Body Composition:  Weight regain of ~25 lbs. In the last 5 years.  Open to trying weight loss meds.  On Lyrica  and nortriptyline which have weight gain as side effect. Ms. NIKCOLE EISCHEID reports weighing self once a week.  Nutrition Concerns:  Weight regain and abnormal nutrition-related labs  Patient Identified Goals: Follow a healthy meal and snack pattern of eating a small protein-containing meal within 1 hour of waking up and every 3-4 hours until goals are met.  Use protein shakes or protein bars as meal/snack substitute. Track food and fluid intake on a food tracker app at least 3 days per week.   Take all specific multivitamins and other supplements as recommended.  Engage in strength training/resistance physical activity 2-3x/wk and cardio (such as walking) 3-5 days per week. Follow up with dietitian on 06/13/24 in clinic.  Intervention  1.  Ms. KETA VANVALKENBURGH was instructed to follow a 1,100 - 1,300 calorie high protein, low fat, lower carbohydrate meal plan.  Sample meal plan and menu were provided and reviewed.  Protein intake goal: 80 - 100 grams per day.  Fluid intake goal: 85 - 105 ounces daily. 2.  She will continue all recommended bariatric eating behaviors and meal planning principles to help maintain good health, prevent weight regain, achieve goal weight. 3.  Patient will resume tracking daily food and fluid intake, in an effort to stay on track with diet and meeting daily goals. 4.  Other recommendations:  Take bariatric surgery specific multivitamins with iron, calcium supplements and other vitamin or mineral supplements as prescribed/recommended.  Engage in structured cardio and strength training physical activity 3-5 days per week.  5.  Follow up with RD on  06/13/24 or prn is recommended.   Monitor Weight; Protein intake; Fluid intake; Post bariatric surgery  eating behaviors and meal planning principles; Vitamin and Mineral supplementation; Physical Activity  Hadassah Dollar, MPH RD LDN CDCES Licensed Registered Dietitian/Nutritionist  Certified Diabetes Care and Education Specialist

## 2024-04-12 ENCOUNTER — Ambulatory Visit: Admitting: Family Medicine

## 2024-04-13 ENCOUNTER — Ambulatory Visit: Admitting: Family Medicine

## 2024-04-13 ENCOUNTER — Encounter: Payer: Self-pay | Admitting: Family Medicine

## 2024-04-13 VITALS — BP 117/78 | HR 83 | Temp 98.1°F | Resp 18 | Ht 66.0 in | Wt 210.0 lb

## 2024-04-13 DIAGNOSIS — Z800 Family history of malignant neoplasm of digestive organs: Secondary | ICD-10-CM | POA: Diagnosis not present

## 2024-04-13 DIAGNOSIS — K909 Intestinal malabsorption, unspecified: Secondary | ICD-10-CM

## 2024-04-13 DIAGNOSIS — G6289 Other specified polyneuropathies: Secondary | ICD-10-CM | POA: Diagnosis not present

## 2024-04-14 LAB — VITAMIN B12: Vitamin B-12: 857 pg/mL (ref 232–1245)

## 2024-04-15 ENCOUNTER — Ambulatory Visit
Admission: EM | Admit: 2024-04-15 | Discharge: 2024-04-15 | Disposition: A | Discharge status: Home / Self Care | Attending: Emergency Medicine | Admitting: Emergency Medicine

## 2024-04-15 DIAGNOSIS — J0140 Acute pansinusitis, unspecified: Secondary | ICD-10-CM

## 2024-04-15 MED ORDER — LEVOFLOXACIN 500 MG PO TABS: 500.0000 mg | ORAL_TABLET | Freq: Every day | ORAL | 0 refills | Status: AC

## 2024-04-15 NOTE — ED Provider Notes (Signed)
 [facility]    CSN: [id number] Arrival date & time: 04/15/24  0915      History   Chief Complaint No chief complaint on file.   HPI Sharon [name] is a 63 y.o. female.   HPI  63 year old female with past medical history significant for anxiety, depression, restless leg syndrome, sleep apnea, reflux disease, and headaches presents for evaluation of frontal sinus headache, sinus pressure, postnasal drip, green nasal discharge that has been going on for the last week.  She denies any fever or cough.   Past Medical History:  Diagnosis Date   Acid reflux    H/O PRIOR TO GASTRC BYPASS   Allergy 35 years ago   Anemia    H/O   Anxiety    Arthritis    Bladder spasm    Depression    Headache    H/O MIGRAINES   History of kidney stones    Left ureteral stone    Leg DVT (deep venous thromboembolism), acute (HCC)    x 2-both times after a surgery-went to see hematologist 2017 (Dr Jacobo)  and no clotting disorder ever found   Neuromuscular disorder (HCC) 05/2023   Neurapathy (over 50% both legs below knees)   Osteoporosis 03/27/2023   During foot surgery my big toe was being cut and it crumpled in dr hand   Restless leg syndrome    Sepsis (HCC)    FROM KIDNEY STONES   Sleep apnea 15 years ago   HAD GASTRIC BYPASS AND DOES NOT HAVE SLEEP APNEA   Ulcer 11/19/2023   On meds now and I'm scheduled for endoscopy Feb 26, 2024   Urge incontinence     Patient Active Problem List   Diagnosis Date Noted   Gastric ulcer 03/15/2024   Osteoporosis 03/15/2024   Abnormal TSH 03/15/2024   Acute lower GI bleeding 03/10/2024   Vitamin B 12 deficiency 03/10/2024   Pulmonary nodules 12/02/2021   Allergic rhinitis due to animal (cat) (dog) hair and dander 07/04/2021   Allergic rhinitis due to pollen 07/04/2021   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/2021   Acquired pes planus of left foot 07/01/2021   Anxiety 03/30/2019   Depression 03/30/2019   Migraine 03/30/2019    Marginal ulcer 03/24/2017   S/P gastric bypass 03/24/2017   Hydronephrosis 11/15/2016   GERD (gastroesophageal reflux disease) 11/15/2016   Acute deep vein thrombosis (DVT) of popliteal vein of right lower extremity (HCC) 01/04/2016   Incomplete tear of right rotator cuff 11/22/2015   Moderate obstructive sleep apnea 09/21/2015   Kidney stones 09/10/2015   Urge incontinence 09/10/2015   S/P laparoscopic cholecystectomy 09/05/2015   Paralabral cyst of shoulder, right, subsequent encounter 01/10/2015    Past Surgical History:  Procedure Laterality Date   ABDOMINAL HYSTERECTOMY  05/2006   TOTAL ABDOMINAL HYSTERECTOMY W/ BILATERAL SALPINGOOPHORECTOMY   BARIATRIC SURGERY  05/15/2016   CHOLECYSTECTOMY     ESOPHAGOGASTRODUODENOSCOPY  05/15/2016   KIDNEY STONE SURGERY     KNEE ARTHROSCOPY Right    X 2   KNEE ARTHROSCOPY Right 11/11/2018   Procedure: Partial right knee arthroscopy,arthroscopic medial and lateral menisectomy, plica excision;  Surgeon: Kathlynn Sharper, MD;  Location: [facility] ORS;  Service: Orthopedics;  Laterality: Right;   KNEE SURGERY Left 2006   SHOULDER ARTHROSCOPY WITH OPEN ROTATOR CUFF REPAIR Right 11/22/2015   Procedure: SHOULDER ARTHROSCOPY, DEBRIDEMENT, DECOMPRESSION, SLAP REPAIR, POSSIBLE BICEP TENODESIS;  Surgeon: Norleen JINNY Maltos, MD;  Location: [facility] ORS;  Service: Orthopedics;  Laterality: Right;  SINUS EXPLORATION     SMALL INTESTINE SURGERY  05/14/2016   Gastric bypass    OB History   No obstetric history on file.      Home Medications    Prior to Admission medications   Medication Sig Start Date End Date Taking? Authorizing Provider  levofloxacin  (LEVAQUIN ) 500 MG tablet Take 1 tablet (500 mg total) by mouth daily. 04/15/24  Yes Bernardino Ditch, NP  albuterol  (VENTOLIN  HFA) 108 (90 Base) MCG/ACT inhaler Inhale 2 puffs into the lungs every 4 (four) hours as needed. 08/17/23   Brimage, Vondra, DO  levocetirizine (XYZAL) 5 MG tablet SMARTSIG:1 Tablet(s) By Mouth  Every Evening 03/04/23   [provider]  mometasone (ELOCON) 0.1 % cream Apply 1 Application topically daily.    [provider]  montelukast  (SINGULAIR ) 10 MG tablet Take 10 mg by mouth at bedtime.    [provider]  nortriptyline (PAMELOR) 10 MG capsule Take 10 mg by mouth at bedtime. 01/20/24   [provider]  Olopatadine HCl (PATADAY) 0.2 % SOLN Apply 1 drop to eye daily.    [provider]  pantoprazole  (PROTONIX ) 40 MG tablet Take 1 tablet (40 mg total) by mouth 2 (two) times daily. 03/15/24 03/15/25  Ziglar, Susan K, MD  phentermine (ADIPEX-P) 37.5 MG tablet Take 37.5 mg by mouth daily. 07/31/23   [provider]  pregabalin  (LYRICA ) 50 MG capsule TAKE 1 CAPSULE(50 MG) BY MOUTH THREE TIMES DAILY 02/16/24   Hyatt, Max T, DPM  sucralfate  (CARAFATE ) 1 g tablet Take 1 tablet (1 g total) by mouth 4 (four) times daily -  with meals and at bedtime. 11/19/23 11/18/24  Floy Roberts, MD  triamcinolone  (NASACORT ) 55 MCG/ACT AERO nasal inhaler 1 spray in each nostril    [provider]  apixaban  (ELIQUIS ) 5 MG TABS tablet Take 1 tablet (5 mg total) by mouth 2 (two) times daily. 11/11/18 04/15/19  Kathlynn Sharper, MD    Family History Family History  Problem Relation Age of Onset   Cancer Mother        rectal   Lung cancer Father    Early death Father    Hematuria Brother    Prostate cancer Brother    Kidney Stones Brother    Kidney disease Paternal Grandfather    Bladder Cancer Neg Hx    Kidney cancer Neg Hx     Social History Social History   Tobacco Use   Smoking status: Never    Passive exposure: Never   Smokeless tobacco: Never  Vaping Use   Vaping status: Never Used  Substance Use Topics   Alcohol use: No   Drug use: No     Allergies   Alcohol, Augmentin  [amoxicillin -pot clavulanate], Bacitracin-neomycin-polymyxin, Bacitracin-polymyxin b, Codeine, Hydrocodone, Hydrocortisone, Lanolin, Neomycin, Norepinephrine  bitartrate, Other, Oxycodone-acetaminophen , Penicillin g sodium, Tape, Tapentadol, Tramadol, Tramadol hcl, Trimethoprim, Wound dressings, Bacitracin, Clarithromycin, Latex, Nickel, Oxycodone, Penicillins, and Sulfamethoxazole-trimethoprim   Review of Systems Review of Systems  Constitutional:  Negative for fever.  HENT:  Positive for congestion, rhinorrhea, sinus pressure and sinus pain. Negative for ear pain.   Respiratory:  Negative for cough.      Physical Exam Triage Vital Signs ED Triage Vitals  Encounter Vitals Group     BP      Girls Systolic BP Percentile      Girls Diastolic BP Percentile      Boys Systolic BP Percentile      Boys Diastolic BP Percentile  Pulse      Resp      Temp      Temp src      SpO2      Weight      Height      Head Circumference      Peak Flow      Pain Score      Pain Loc      Pain Education      Exclude from Growth Chart    No data found.  Updated Vital Signs BP 139/86 (BP Location: Right Arm)   Pulse 77   Temp 98.2 F (36.8 C) (Oral)   Resp 18   SpO2 95%   Visual Acuity Right Eye Distance:   Left Eye Distance:   Bilateral Distance:    Right Eye Near:   Left Eye Near:    Bilateral Near:     Physical Exam Vitals and nursing note reviewed.  Constitutional:      Appearance: Normal appearance. She is not ill-appearing.  HENT:     Head: Normocephalic and atraumatic.     Right Ear: Tympanic membrane, ear canal and external ear normal. There is no impacted cerumen.     Left Ear: Tympanic membrane, ear canal and external ear normal. There is no impacted cerumen.     Nose: Congestion and rhinorrhea present.     Comments: Nasal mucosa edematous erythematous with green discharge in both nares.  Bilateral frontal and maxillary sinuses are tender to compression.    Mouth/Throat:     Mouth: Mucous membranes are moist.     Pharynx: Oropharynx is clear. No oropharyngeal exudate or posterior oropharyngeal erythema.   Cardiovascular:     Rate and Rhythm: Normal rate and regular rhythm.     Pulses: Normal pulses.     Heart sounds: Normal heart sounds. No murmur heard.    No friction rub. No gallop.  Pulmonary:     Effort: Pulmonary effort is normal.     Breath sounds: Normal breath sounds. No wheezing, rhonchi or rales.  Musculoskeletal:     Cervical back: Normal range of motion and neck supple. No tenderness.  Lymphadenopathy:     Cervical: No cervical adenopathy.  Skin:    General: Skin is warm and dry.     Capillary Refill: Capillary refill takes less than 2 seconds.     Findings: No erythema or rash.  Neurological:     General: No focal deficit present.     Mental Status: She is alert and oriented to person, place, and time.      UC Treatments / Results  Labs (all labs ordered are listed, but only abnormal results are displayed) Labs Reviewed - No data to display  EKG   Radiology No results found.  Procedures Procedures (including critical care time)  Medications Ordered in UC Medications - No data to display  Initial Impression / Assessment and Plan / UC Course  I have reviewed the triage vital signs and the nursing notes.  Pertinent labs & imaging results that were available during my care of the patient were reviewed by me and considered in my medical decision making (see chart for details).   Patient is a nontoxic-appearing 63 year old female presenting for evaluation 1 week worth of respiratory and sinus symptoms.  She reports that she was seen several months ago for frontal sinusitis and prescribed doxycycline  but had to have 2 rounds of antibiotics as the first round did not cut it.  She is  requesting a Z-Pak.  Have advised her that Z-Pak's are no longer advised for respiratory infections in adults as the bacteria has become resistant.  Given that she has had symptoms for a week I do feel that a trial of antibiotics is warranted.  The patient has a documented rash allergy  to clarithromycin.  I will do a course of Levaquin    Final Clinical Impressions(s) / UC Diagnoses   Final diagnoses:  Acute non-recurrent pansinusitis     Discharge Instructions      The Levaquin  daily with food for 7 days for treatment of your sinusitis.  Perform sinus irrigation 2-3 times a day with a [employer]Med sinus rinse kit and distilled water.  Do not use tap water.  You can use plain over-the-counter Mucinex every 6 hours to break up the stickiness of the mucus so your body can clear it.  Increase your oral fluid intake to thin out your mucus so that is also able for your body to clear more easily.  Take an over-the-counter probiotic, such as Culturelle-align-activia, 1 hour after each dose of antibiotic to prevent diarrhea.  If you develop any new or worsening symptoms return for reevaluation or see your primary care provider.      ED Prescriptions     Medication Sig Dispense Auth. Provider   levofloxacin  (LEVAQUIN ) 500 MG tablet Take 1 tablet (500 mg total) by mouth daily. 7 tablet Bernardino Ditch, NP      PDMP not reviewed this encounter.   Bernardino Ditch, NP 04/15/24 1032

## 2024-04-15 NOTE — Discharge Instructions (Addendum)
 The Levaquin  daily with food for 7 days for treatment of your sinusitis.  Perform sinus irrigation 2-3 times a day with a [employer]Med sinus rinse kit and distilled water.  Do not use tap water.  You can use plain over-the-counter Mucinex every 6 hours to break up the stickiness of the mucus so your body can clear it.  Increase your oral fluid intake to thin out your mucus so that is also able for your body to clear more easily.  Take an over-the-counter probiotic, such as Culturelle-align-activia, 1 hour after each dose of antibiotic to prevent diarrhea.  If you develop any new or worsening symptoms return for reevaluation or see your primary care provider.

## 2024-04-15 NOTE — ED Triage Notes (Signed)
 Pt reports sinus congestion, post nasal drip, sinus headache, drainage that is green in color x 1 week. Pt has tried otc allergy meds has found no relief.

## 2024-04-16 ENCOUNTER — Other Ambulatory Visit: Payer: Self-pay | Admitting: Family Medicine

## 2024-04-16 DIAGNOSIS — Z800 Family history of malignant neoplasm of digestive organs: Secondary | ICD-10-CM | POA: Insufficient documentation

## 2024-04-16 DIAGNOSIS — G629 Polyneuropathy, unspecified: Secondary | ICD-10-CM | POA: Insufficient documentation

## 2024-04-16 DIAGNOSIS — K289 Gastrojejunal ulcer, unspecified as acute or chronic, without hemorrhage or perforation: Secondary | ICD-10-CM

## 2024-04-16 DIAGNOSIS — K909 Intestinal malabsorption, unspecified: Secondary | ICD-10-CM | POA: Insufficient documentation

## 2024-04-16 NOTE — Assessment & Plan Note (Signed)
 Mother died of colon cancer at age 63.  Last colonoscopy 02/2024 one TA.  Follow-up in 5 years.

## 2024-04-16 NOTE — Progress Notes (Signed)
 Established Patient Office Visit  Subjective   Patient ID: Sharon [name], female    DOB: Jul 14, 1960  Age: 62 y.o. MRN: [id number]  Chief Complaint  Patient presents with   Medical Management of Chronic Issues    HPI Discussed the use of AI scribe software for clinical note transcription with the patient, who gave verbal consent to proceed.  History of Present Illness   Sharon [name] is a delightful 63 year old female s/p gastric bypass, vitamin B12 and D deficiencies, osteoporosis (T-score of -2.8, on Prolia), Hx marginal ulcer (11/2023), allergies, FMH colon cancer (mother died at age 16, colonoscopy 2024/04/06 1 TA polyp, 5-year follow-up), diverticulosis, hemorrhoids, who presents with worsening foot pain and neuropathy.  She has been experiencing worsening foot pain and neuropathy following foot surgery, which involved the removal of a tumor and the insertion of a silicone implant. Post-surgery, she has significant numbness and tingling in her foot, extending from the foot to the knee, and is now experiencing similar symptoms in the other leg. She reports pain in her foot, difficulty walking, and difficulty driving due to her symptoms. She has been prescribed Lyrica  and nortriptyline, but these medications have not alleviated her symptoms and have caused side effects such as foggy-headedness.  She has a family history of colon cancer, with her mother having died from the disease at age 76. She underwent a colonoscopy 02/2024 where a polyp was removed.  She has a 5-year follow-up for her colonoscopy.  She experienced severe constipation and rectal bleeding prior to the procedure, which was attributed to a blockage and exacerbated hemorrhoids.  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 She has experienced sepsis twice due to kidney stones, contributing to weight gain.  She feels bloated due to increased fiber intake to prevent constipation.  She has a history of low B12 levels, which dropped to 153, but has improved to 765 with oral supplementation. She also experiences severe dehydration, consuming only 20-30 ounces of fluid daily, leading to dizziness and fatigue. She acknowledges neglecting her health due to work demands.  She is currently taking Prolia for osteoporosis. She has a history of severe hemorrhoid pain and rectal bleeding, which was addressed during her recent hospitalization.       Objective:     BP 117/78 (BP Location: Left Arm, Patient Position: Sitting, Cuff Size: Large)   Pulse 83   Temp 98.1 F (36.7 C) (Oral)   Resp 18   Ht 5' 6 (1.676 m)   Wt 210 lb (95.3 kg)   SpO2 97%   BMI 33.89 kg/m    Physical Exam Vitals and nursing note reviewed.  Constitutional:      Appearance: Normal appearance.  HENT:     Head: Normocephalic and atraumatic.  Eyes:     Conjunctiva/sclera: Conjunctivae normal.  Cardiovascular:     Rate and Rhythm: Normal rate and regular rhythm.  Pulmonary:     Effort: Pulmonary effort is normal.     Breath sounds: Normal breath sounds.  Musculoskeletal:     Right lower leg: No edema.     Left lower leg: No edema.  Skin:    General: Skin is warm and dry.  Neurological:     Mental Status: She is alert and oriented to person, place, and time.  Psychiatric:        Mood and Affect: Mood  normal.        Behavior: Behavior normal.        Thought Content: Thought content normal.        Judgment: Judgment normal.          Results for orders placed or performed in visit on 04/13/24  Vitamin B12  Result Value Ref Range   Vitamin B-12 857 232 - 1,245 pg/mL      The ASCVD Risk score (Arnett DK, et al., 2019) failed to calculate for the following reasons:   Cannot find a previous HDL lab   Cannot find a previous total cholesterol lab    Assessment & Plan:  Intestinal malabsorption, unspecified  type Assessment & Plan: As a result of her Roux-en-Y surgery she has deficiencies in vitamins B12, D.  Will check B12, methylmalonic acid vitamin D.  Her weight loss surgeons office would like calcium, albumin and prealbumin checked as well.  Orders: -     Vitamin B6 -     CMP14+EGFR -     Prealbumin -     VITAMIN D 25 Hydroxy (Vit-D Deficiency, Fractures) -     Vitamin B12  Other polyneuropathy Assessment & Plan: Discussed nerve supplementation with alpha lipoic acid and MSN.   Family history of colon cancer in mother Assessment & Plan: Mother died of colon cancer at age 48.  Last colonoscopy 02/2024 one TA.  Follow-up in 5 years.      Return in about 6 months (around 10/11/2024).    Betsabe Iglesia K Xayvion Shirah, MD

## 2024-04-16 NOTE — Assessment & Plan Note (Signed)
 Discussed nerve supplementation with alpha lipoic acid and MSN.

## 2024-04-16 NOTE — Assessment & Plan Note (Signed)
 As a result of her Roux-en-Y surgery she has deficiencies in vitamins B12, D.  Will check B12, methylmalonic acid vitamin D.  Her weight loss surgeons office would like calcium, albumin and prealbumin checked as well.

## 2024-04-19 ENCOUNTER — Ambulatory Visit: Payer: Self-pay | Admitting: Family Medicine

## 2024-04-20 LAB — CMP14+EGFR
ALT: 21 IU/L (ref 0–32)
AST: 21 IU/L (ref 0–40)
Albumin: 3.9 g/dL (ref 3.9–4.9)
Alkaline Phosphatase: 68 IU/L (ref 49–135)
BUN/Creatinine Ratio: 28 (ref 12–28)
BUN: 20 mg/dL (ref 8–27)
Bilirubin Total: 0.3 mg/dL (ref 0.0–1.2)
CO2: 24 mmol/L (ref 20–29)
Calcium: 8.6 mg/dL — ABNORMAL LOW (ref 8.7–10.3)
Chloride: 103 mmol/L (ref 96–106)
Creatinine, Ser: 0.71 mg/dL (ref 0.57–1.00)
Globulin, Total: 2.3 g/dL (ref 1.5–4.5)
Glucose: 86 mg/dL (ref 70–99)
Potassium: 4.2 mmol/L (ref 3.5–5.2)
Sodium: 140 mmol/L (ref 134–144)
Total Protein: 6.2 g/dL (ref 6.0–8.5)
eGFR: 95 mL/min/1.73 (ref >59)

## 2024-04-20 LAB — VITAMIN D 25 HYDROXY (VIT D DEFICIENCY, FRACTURES): Vit D, 25-Hydroxy: 27.9 ng/mL — ABNORMAL LOW (ref 30.0–100.0)

## 2024-04-20 LAB — PREALBUMIN: PREALBUMIN: 19 mg/dL (ref 10–36)

## 2024-04-20 LAB — VITAMIN B6: Vitamin B6: 10.1 ug/L (ref 3.4–65.2)

## 2024-05-09 ENCOUNTER — Ambulatory Visit: Admitting: Family Medicine

## 2024-05-13 ENCOUNTER — Encounter: Payer: Self-pay | Admitting: Family Medicine

## 2024-05-13 ENCOUNTER — Ambulatory Visit: Admitting: Family Medicine

## 2024-05-13 VITALS — BP 120/79 | HR 72 | Resp 16 | Ht 66.0 in | Wt 210.0 lb

## 2024-05-13 DIAGNOSIS — M8080XD Other osteoporosis with current pathological fracture, unspecified site, subsequent encounter for fracture with routine healing: Secondary | ICD-10-CM | POA: Diagnosis not present

## 2024-05-13 DIAGNOSIS — G6289 Other specified polyneuropathies: Secondary | ICD-10-CM

## 2024-05-13 DIAGNOSIS — Z9181 History of falling: Secondary | ICD-10-CM | POA: Diagnosis not present

## 2024-05-13 MED ORDER — VITAMIN B-12 1000 MCG PO TABS: 1000.0000 ug | ORAL_TABLET | Freq: Every day | ORAL | 11 refills | Status: AC

## 2024-05-13 NOTE — Assessment & Plan Note (Signed)
 Advise she should not take Prolia until her vitamin D  level is normal and her calcium level is normal.

## 2024-05-13 NOTE — Progress Notes (Signed)
 "  Established Patient Office Visit  Subjective   Patient ID: Sharon [name], female    DOB: Jul 08, 1960  Age: 63 y.o. MRN: [id number]  Chief Complaint  Patient presents with   Follow-up    HPI Delightful 63 year old woman s/p gastric bypass, Hx marginal ulcer (11/2023), vitamins B12 and D deficiencies, osteoporosis (T-score of -2.8, on Prolia in (, allergies, FMH colon cancer (mother died at age 35, colonoscopy 14-Mar-2024 1 TA polyp, 5-year follow-up), diverticulosis, hemorrhoids, peripheral neuropathy as foot pain following foot surgery.  She complains of worsening of her foot pain and significant numbness and tingling.  She is seen neurology (Dr. Lane) and he has her taking nortriptyline 4 mg and Lyrica .  She also uses some Voltaren gel for the pain.  She is having difficulties driving because she has to drive with her heel because she does not feel things in her toes and she also develops pain if she drives with her toes.  She cannot take NSAIDs because she has had Roux-en-Y surgery.  She has also had a marginal ulcer. She is on B12 1000 mcg daily.  Her vitamin D  was 27.9 and she takes vitamin D3 50,000 international units weekly.  Her calcium was also low at 8.6.  She is taking 2 calcium tabs once a day. She complains of severe cramping in her leg at night. She has fallen twice on the same status steps.  She falls on the way up the stairs there is a railing but she has not been holding onto it.  She does not lift her right foot high enough and it catches on the stair which then makes her fall.  She has a cane and a rollator but she will use either one of these.   Objective:     BP 120/79   Pulse 72   Resp 16   Ht 5' 6 (1.676 m)   Wt 210 lb (95.3 kg)   SpO2 99%   BMI 33.89 kg/m    Physical Exam Vitals and nursing note reviewed.  Constitutional:      Appearance: Normal appearance.  HENT:     Head: Normocephalic and atraumatic.  Eyes:     Conjunctiva/sclera: Conjunctivae  normal.  Cardiovascular:     Rate and Rhythm: Normal rate and regular rhythm.  Pulmonary:     Effort: Pulmonary effort is normal.     Breath sounds: Normal breath sounds.  Musculoskeletal:     Right lower leg: No edema.     Left lower leg: No edema.  Skin:    General: Skin is warm and dry.  Neurological:     Mental Status: She is alert and oriented to person, place, and time.     Comments: Positive Romberg's test.  Psychiatric:        Mood and Affect: Mood normal.        Behavior: Behavior normal.        Thought Content: Thought content normal.        Judgment: Judgment normal.          No results found for any visits on 05/13/24.    The ASCVD Risk score (Arnett DK, et al., 2019) failed to calculate for the following reasons:   Cannot find a previous HDL lab   Cannot find a previous total cholesterol lab   * - Cholesterol units were assumed    Assessment & Plan:  Other polyneuropathy Assessment & Plan: Very painful peripheral neuropathy.  Had discussed alpha lipoic  acid and MSM as nutrients for her nerves but she has not tried this.  Have advised that is not safe for her to drive as her peripheral neuropathy affects her right foot.  Discussed walking with a cane or a rollator to keep her from falling.  She has a positive Romberg's test.  Orders: -     Vitamin B-12; Take 1 tablet (1,000 mcg total) by mouth daily.  Dispense: 30 tablet; Refill: 11  At risk for falls Assessment & Plan: She has fallen twice going up stairs because she caught her right foot.  Encouraging her to look at her feet because she has Romberg positive and she does not get significant feedback to walk up stairs without watching.  Encouraged her to turn the lights on when she gets up at night to void.  Concerned about falling because of her risk of fracture.  Her T-score is -2.7.   Other osteoporosis with current pathological fracture with routine healing, subsequent encounter Assessment &  Plan: Advise she should not take Prolia until her vitamin D  level is normal and her calcium level is normal.      Return in about 8 weeks (around 07/08/2024).    Dianna Deshler K Yann Biehn, MD "

## 2024-05-13 NOTE — Assessment & Plan Note (Signed)
 She has fallen twice going up stairs because she caught her right foot.  Encouraging her to look at her feet because she has Romberg positive and she does not get significant feedback to walk up stairs without watching.  Encouraged her to turn the lights on when she gets up at night to void.  Concerned about falling because of her risk of fracture.  Her T-score is -2.7.

## 2024-05-13 NOTE — Assessment & Plan Note (Signed)
 Very painful peripheral neuropathy.  Had discussed alpha lipoic acid and MSM as nutrients for her nerves but she has not tried this.  Have advised that is not safe for her to drive as her peripheral neuropathy affects her right foot.  Discussed walking with a cane or a rollator to keep her from falling.  She has a positive Romberg's test.

## 2024-05-25 ENCOUNTER — Encounter: Payer: Self-pay | Admitting: Family Medicine

## 2024-05-30 ENCOUNTER — Ambulatory Visit: Admitting: Family Medicine

## 2024-05-30 ENCOUNTER — Encounter: Payer: Self-pay | Admitting: Family Medicine

## 2024-05-30 VITALS — BP 115/76 | HR 90 | Temp 98.1°F | Ht 66.0 in | Wt 217.8 lb

## 2024-05-30 DIAGNOSIS — R252 Cramp and spasm: Secondary | ICD-10-CM

## 2024-05-30 DIAGNOSIS — M810 Age-related osteoporosis without current pathological fracture: Secondary | ICD-10-CM | POA: Diagnosis not present

## 2024-05-30 DIAGNOSIS — Z9884 Bariatric surgery status: Secondary | ICD-10-CM

## 2024-05-30 DIAGNOSIS — R2689 Other abnormalities of gait and mobility: Secondary | ICD-10-CM | POA: Diagnosis not present

## 2024-05-30 DIAGNOSIS — E559 Vitamin D deficiency, unspecified: Secondary | ICD-10-CM

## 2024-05-30 NOTE — Progress Notes (Signed)
 "  Established Patient Office Visit  Subjective   Patient ID: Sharon [name], female    DOB: 14-Jun-1960  Age: 64 y.o. MRN: [id number]  Chief Complaint  Patient presents with   Sore Throat    Patient stated over the weekend she started having a sore throat with some green mucus.     HPI Discussed the use of AI scribe software for clinical note transcription with the patient, who gave verbal consent to proceed.  History of Present Illness   Sharon [name] is a 64 year old female who presents with concerns about mobility and medication management.  She experienced a fall approximately four days after the new year while getting out of her vehicle, resulting in soreness in her spine. She has been using a cane for the last two weeks due to fear of falling. Her balance issues have worsened, and she is considering resuming physical therapy to improve her strength and balance. She previously underwent physical therapy a year ago but felt it worsened her condition due to the intensity of the exercises.  She has gained weight over the last three weeks, which she attributes to her decreased mobility. The weight gain is causing a strain on her feet, and her legs swell when she is up working for long periods but not when she is at home with her legs elevated. She is considering a new oral form of Wegovy for weight management, as her insurance denied coverage for Zepbound due to her lack of a diabetes diagnosis.  She experiences severe cramps, particularly when sitting for long periods, which start in her calf and can occur in either leg. These cramps are worse at night and are accompanied by swelling. She has increased her fluid intake from 25 ounces to 40 ounces daily, although she acknowledges this may still be insufficient. She is currently taking Lyrica  50 mg three times a day, nortriptyline 40 mg, and B12 1000 mcg daily. She has been on alpha lipoic acid for about three weeks but has not noticed  any difference. She has difficulty with nortriptyline, describing a 'hangover' feeling when waking up at night.  She reports a sore throat with drainage that started on Friday, without fever, and describes the nasal discharge as greenish with blood. She has been using nasal flushing and Mucinex in the past, which seemed to help.  She has a history of gastric bypass surgery and is unable to take ibuprofen  or other anti-inflammatory medications due to the risk of stomach issues.     Objective:     BP 115/76   Pulse 90   Temp 98.1 F (36.7 C)   Ht 5' 6 (1.676 m)   Wt 217 lb 12.8 oz (98.8 kg)   SpO2 97%   BMI 35.15 kg/m    Physical Exam Vitals and nursing note reviewed.  Constitutional:      Appearance: Normal appearance.  HENT:     Head: Normocephalic and atraumatic.     Right Ear: Tympanic membrane, ear canal and external ear normal. There is no impacted cerumen.     Left Ear: Tympanic membrane, ear canal and external ear normal. There is no impacted cerumen.     Nose: Congestion present.     Right Turbinates: Swollen.     Left Turbinates: Swollen.     Mouth/Throat:     Pharynx: Oropharynx is clear. Posterior oropharyngeal erythema present.  Eyes:     Conjunctiva/sclera: Conjunctivae normal.  Cardiovascular:     Rate  and Rhythm: Normal rate and regular rhythm.  Pulmonary:     Effort: Pulmonary effort is normal.     Breath sounds: Normal breath sounds.  Musculoskeletal:     Cervical back: Neck supple. No tenderness.  Lymphadenopathy:     Cervical: No cervical adenopathy.  Skin:    General: Skin is warm and dry.  Neurological:     Mental Status: She is alert and oriented to person, place, and time.  Psychiatric:        Mood and Affect: Mood normal.        Behavior: Behavior normal.        Thought Content: Thought content normal.        Judgment: Judgment normal.          Results for orders placed or performed in visit on 05/30/24  Vitamin D  (25 hydroxy)   Result Value Ref Range   Vit D, 25-Hydroxy 25.9 (L) 30.0 - 100.0 ng/mL  CMP14+EGFR  Result Value Ref Range   Glucose 87 70 - 99 mg/dL   BUN 17 8 - 27 mg/dL   Creatinine, Ser 9.30 0.57 - 1.00 mg/dL   eGFR 97 >40 fO/fpw/8.26   BUN/Creatinine Ratio 25 12 - 28   Sodium 142 134 - 144 mmol/L   Potassium 4.3 3.5 - 5.2 mmol/L   Chloride 106 96 - 106 mmol/L   CO2 22 20 - 29 mmol/L   Calcium 8.6 (L) 8.7 - 10.3 mg/dL   Total Protein 6.3 6.0 - 8.5 g/dL   Albumin 4.1 3.9 - 4.9 g/dL   Globulin, Total 2.2 1.5 - 4.5 g/dL   Bilirubin Total 0.4 0.0 - 1.2 mg/dL   Alkaline Phosphatase 73 49 - 135 IU/L   AST 23 0 - 40 IU/L   ALT 21 0 - 32 IU/L  Magnesium  Result Value Ref Range   Magnesium 2.3 1.6 - 2.3 mg/dL      The ASCVD Risk score (Arnett DK, et al., 2019) failed to calculate for the following reasons:   Cannot find a previous HDL lab   Cannot find a previous total cholesterol lab   * - Cholesterol units were assumed    Assessment & Plan:  Loss of balance Assessment & Plan: Has a positive Romberg test.  He is using a cane for stability but recommended a rollator for better stability.  Referral to physical therapy for gait training.  Instructed to ensure adequate lighting at night to prevent falls.  Orders: -     Ambulatory referral to Physical Therapy  Age related osteoporosis, unspecified pathological fracture presence Assessment & Plan: Has osteoporosis and is treated with Prolia every 6 months.  Checking her vitamin D  and calcium encouraged vitamin D  5000 IU daily and calcium 600 mg twice a day.  She is concerned she may not be able to continue Prolia because of insurance changes.  Orders: -     CMP14+EGFR  Vitamin D  deficiency -     VITAMIN D  25 Hydroxy (Vit-D Deficiency, Fractures)  Low calcium levels -     CMP14+EGFR  Muscle cramps at night -     Magnesium  Muscle cramps Assessment & Plan: Muscle cramps and spasms particularly in the legs possibly related to  dehydration and prolonged sitting.  Symptoms occur at night and are severe enough to wake her from sleep.  Will check magnesium and potassium levels.  Increase fluid intake   Status post bariatric surgery Assessment & Plan: Recent weight gain attributed to  decreased mobility and stress.  Insurance has denied coverage for Zepbound as she does not have diabetes.  Discussed the pill form of Wegovy which is $150 per month regardless of insurance status.      Return in about 3 months (around 08/28/2024).    Rosendo Couser K Chaynce Schafer, MD "

## 2024-05-31 LAB — CMP14+EGFR
ALT: 21 IU/L (ref 0–32)
AST: 23 IU/L (ref 0–40)
Albumin: 4.1 g/dL (ref 3.9–4.9)
Alkaline Phosphatase: 73 IU/L (ref 49–135)
BUN/Creatinine Ratio: 25 (ref 12–28)
BUN: 17 mg/dL (ref 8–27)
Bilirubin Total: 0.4 mg/dL (ref 0.0–1.2)
CO2: 22 mmol/L (ref 20–29)
Calcium: 8.6 mg/dL — ABNORMAL LOW (ref 8.7–10.3)
Chloride: 106 mmol/L (ref 96–106)
Creatinine, Ser: 0.69 mg/dL (ref 0.57–1.00)
Globulin, Total: 2.2 g/dL (ref 1.5–4.5)
Glucose: 87 mg/dL (ref 70–99)
Potassium: 4.3 mmol/L (ref 3.5–5.2)
Sodium: 142 mmol/L (ref 134–144)
Total Protein: 6.3 g/dL (ref 6.0–8.5)
eGFR: 97 mL/min/1.73

## 2024-05-31 LAB — MAGNESIUM: Magnesium: 2.3 mg/dL (ref 1.6–2.3)

## 2024-05-31 LAB — VITAMIN D 25 HYDROXY (VIT D DEFICIENCY, FRACTURES): Vit D, 25-Hydroxy: 25.9 ng/mL — ABNORMAL LOW (ref 30.0–100.0)

## 2024-06-05 DIAGNOSIS — R2689 Other abnormalities of gait and mobility: Secondary | ICD-10-CM | POA: Insufficient documentation

## 2024-06-05 DIAGNOSIS — R252 Cramp and spasm: Secondary | ICD-10-CM | POA: Insufficient documentation

## 2024-06-05 NOTE — Assessment & Plan Note (Signed)
 Has a positive Romberg test.  He is using a cane for stability but recommended a rollator for better stability.  Referral to physical therapy for gait training.  Instructed to ensure adequate lighting at night to prevent falls.

## 2024-06-05 NOTE — Assessment & Plan Note (Signed)
 Has osteoporosis and is treated with Prolia every 6 months.  Checking her vitamin D  and calcium encouraged vitamin D  5000 IU daily and calcium 600 mg twice a day.  She is concerned she may not be able to continue Prolia because of insurance changes.

## 2024-06-05 NOTE — Assessment & Plan Note (Signed)
 Muscle cramps and spasms particularly in the legs possibly related to dehydration and prolonged sitting.  Symptoms occur at night and are severe enough to wake her from sleep.  Will check magnesium and potassium levels.  Increase fluid intake

## 2024-06-05 NOTE — Assessment & Plan Note (Signed)
 Recent weight gain attributed to decreased mobility and stress.  Insurance has denied coverage for Zepbound as she does not have diabetes.  Discussed the pill form of Wegovy which is $150 per month regardless of insurance status.

## 2024-06-06 ENCOUNTER — Telehealth: Payer: Self-pay | Admitting: Family Medicine

## 2024-06-06 DIAGNOSIS — Z0279 Encounter for issue of other medical certificate: Secondary | ICD-10-CM

## 2024-06-06 NOTE — Telephone Encounter (Signed)
 Called and spoke with

## 2024-06-06 NOTE — Telephone Encounter (Signed)
 Spoke with pt. via phone call letting her know the Family and Medical Leave form has been completed and ready for pick-up

## 2024-06-07 ENCOUNTER — Ambulatory Visit: Payer: Self-pay | Admitting: Family Medicine

## 2024-06-11 ENCOUNTER — Other Ambulatory Visit: Payer: Self-pay | Admitting: Family Medicine

## 2024-06-11 DIAGNOSIS — G6289 Other specified polyneuropathies: Secondary | ICD-10-CM

## 2024-06-11 MED ORDER — AMITRIPTYLINE HCL 10 MG PO TABS: 40.0000 mg | ORAL_TABLET | Freq: Every day | ORAL | 1 refills | Status: AC

## 2024-06-11 MED ORDER — WEGOVY 1.5 MG PO TABS: 1.5000 mg | ORAL_TABLET | Freq: Every day | ORAL | 0 refills | Status: AC

## 2024-06-13 NOTE — Progress Notes (Signed)
 This video encounter was conducted with the patient's (or proxy's) verbal consent via secure, interactive audio and video telecommunications while away from clinic/office/hospital.  The patient (or proxy) was instructed to have this encounter in a suitably private space and to only have persons present to whom they give permission to participate. In addition, patient identity was confirmed by use of name plus two identifiers.  July 10, 2019 E&M) This visit was coded based on time. I spent a total of 60 minutes in both face-to-face and non-face-to-face activities for this visit on the date of this encounter.  LONG TERM POST OP NUTRITION FOLLOW UP   Ms. 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 Lonell Portenier with weight loss of 75.2 lbs. 15 months from pre op (253.2 lbs., BMI 42.9) and 77%EWL based on ideal body weight of 155 lbs. (to have BMI 25) and excess weight of 98.2 lbs. .  Patient had a VIDEO VISIT today for her long term post op nutrition follow up.       02/24/2024    9:30 AM 03/10/2024   11:28 AM 03/10/2024    8:40 PM 03/10/2024    8:45 PM 04/11/2024   12:00 PM 04/20/2024    2:54 PM 06/09/2024    9:00 AM  [place] BMI  Height 165.1 cm (5' 5)  167.6 cm (5' 6)  167.6 cm (5' 6) 167.6 cm (5' 6) 167.6 cm (5' 6)  Weight 203 lb 202 lb  204 lb 9.6 oz 207 lb 6.4 oz 207 lb 212 lb 8 oz  BMI (Calculated) 33.78   33.04 33.49 33.43 34.31  Basal Metabolic Rate 1488    1510    Fat Mass 98 lb    101 lb    Fat Free Mass 105 lb    106 lb 6.4 oz    Total Body Water 82 lb 3.2 oz    81 lb 3.2 oz    Fat % 24.0-35.9    24.0-35.9    Fat Mass 33.2-58.8lb    33.6-59.6 lb       CURRENT MEDICAL CONDITIONS: Patient Active Problem List  Diagnosis   Migraine   Depression   Anxiety   Acute shoulder pain   Acute ankle pain   Tear of right glenoid labrum   Rotator cuff tendinitis, right   Paralabral cyst of shoulder, right, subsequent encounter   Joint pain    Acid reflux disease   S/P laparoscopic cholecystectomy   Moderate obstructive sleep apnea - overall AHI of 21/hr   Incomplete tear of right rotator cuff   Acute deep vein thrombosis (DVT) of popliteal vein of right lower extremity (CMS/HHS-HCC)   S/P gastric bypass   Marginal ulcer   Acquired pes planus of left foot   Allergic rhinitis   Allergic rhinitis due to animal (cat) (dog) hair and dander   Allergic rhinitis due to pollen   Chronic allergic conjunctivitis   Metatarsalgia of left foot   Pain in left foot   Urticaria   Tendinitis of left foot   Pulmonary nodules   Preoperative evaluation to rule out surgical contraindication   Acute lower GI bleeding   Vitamin B 12 deficiency     MEDICATIONS: Current Outpatient Medications  Medication Sig Dispense Refill   acetaminophen  (TYLENOL ) 325 MG tablet Take by mouth Take 650 mg by mouth every 6 (six) hours as needed for moderate pain.     albuterol  MDI, PROVENTIL , VENTOLIN , PROAIR , HFA 90 mcg/actuation inhaler  Inhale 2 inhalations into the lungs every 4 (four) hours as needed (Patient not taking: Reported on 03/10/2024)     cholecalciferol (VITAMIN D3) 1,250 mcg (50,000 unit) capsule Take 1 capsule (50,000 Units total) by mouth once a week Friday     cyanocobalamin (VITAMIN B12) 1000 MCG tablet Take 1 tablet (1,000 mcg total) by mouth once daily 30 tablet 1   ergocalciferol , vitamin D2, 1,250 mcg (50,000 unit) capsule Take 1 capsule (50,000 Units total) by mouth once a week for 180 days 12 capsule 1   GAVILYTE-G solution Take 4 L by mouth as directed (Patient not taking: Reported on 06/09/2024)     levocetirizine (XYZAL) 5 MG tablet Take 1 tablet by mouth every evening (Patient not taking: Reported on 03/10/2024)     mometasone (ELOCON) 0.1 % cream Apply 1 Application topically once daily     montelukast  (SINGULAIR ) 10 mg tablet Take 10 mg by mouth at bedtime  3   nortriptyline (PAMELOR) 10 MG capsule 10  mg nightly for one week, then increase to 20 mg nightly and continue (Patient taking differently: Take 40 mg by mouth at bedtime) 60 capsule 2   nortriptyline (PAMELOR) 10 MG capsule Take 30 mg at night for two weeks then increase to 40 mg at night and continue that dose (Patient taking differently: Take 40 mg by mouth at bedtime) 60 capsule 3   omeprazole (PRILOSEC) 40 MG DR capsule TAKE 1 CAPSULE(40 MG) BY MOUTH TWICE DAILY BEFORE MEALS (Patient not taking: Reported on 06/09/2024) 60 capsule 0   pantoprazole  (PROTONIX ) 40 MG DR tablet Take 40 mg by mouth once daily     PATADAY 0.2 % ophthalmic solution Place 1 drop into the right eye once daily as needed for Allergies  3   pregabalin  (LYRICA ) 50 MG capsule Take 1 capsule (50 mg total) by mouth 3 (three) times daily     pregabalin  (LYRICA ) 50 MG capsule Take 1 capsule (50 mg total) by mouth 4 (four) times daily 120 capsule 4   tirzepatide (ZEPBOUND) 2.5 mg/0.5 mL pen injector Inject 0.5 mLs (2.5 mg total) subcutaneously once a week (Patient not taking: Reported on 06/09/2024) 2 mL 0   triamcinolone  (NASACORT ) 55 mcg nasal spray Place 1 spray into both nostrils once daily as needed     No current facility-administered medications for this visit.    PAST MEDICAL HISTORY:  Past Medical History:  Diagnosis Date   Acid reflux    Anxiety    Arthritis    Clotting disorder (HHS-HCC)    Depression    DVT (deep venous thrombosis) (CMS/HHS-HCC) 05/2006 12/2015   After my hysterectomy and shoulder surgery 2017   GERD (gastroesophageal reflux disease)    History of endometriosis    History of kidney stones    Kidney stone 06/2014   12/14/17, 12/16/17, 06/2014 hopitalized with sephis from 55m stone   Migraine    Polycystic ovary    complete hysterectomy 2008/cyst on ovaries   Sleep apnea    uses cpap   Urinary incontinence, mixed 09/06/2015   put on meds from Dr Brandon/Urology    PAST SURGICAL HISTORY:  Past Surgical History:   Procedure Laterality Date   PR CHOLECYSTECTOMY  02/2011   Gave out after losing 30 lbs in 2 mths   ESOPHAGOGASTRODOUDENOSCOPY W/BIOPSY  10/22/2015   Procedure: ESOPHAGOGASTRODUODENOSCOPY, FLEXIBLE, TRANSORAL; WITH BIOPSY, SINGLE OR MULTIPLE;  Surgeon: Lonell Cheryl Fletcher, MD;  Location: [facility] ENDO/BRONCH;  Service: General Surgery;;    Limited  arthroscopic debridement, SLAP repair, arthroscopic subacromial decompression, mini-open repair of partial-thickness rotator cuff tear, and mini-open biceps tenodesis, right shoulder. Right 11/22/2015   Dr. Edie   EGD N/A 05/15/2016   Procedure: ESOPHAGOGASTRODUODENOSCOPY, FLEXIBLE, TRANSORAL; DIAGNOSTIC, INCLUDING COLLECTION OF SPECIMEN(S) BY BRUSHING OR WASHING, WHEN PERFORMED (SEPARATE PROCEDURE);  Surgeon: Lonell Cheryl Fletcher, MD;  Location: [facility];  Service: General Surgery;  Laterality: N/A;   BARIATRIC SURGERY  05/15/2016   Gastric Bypass   EGD N/A 02/23/2017   Procedure: EGD;  Surgeon: Portenier, Lonell Cheryl, MD;  Location: [facility] ENDO/BRONCH;  Service: General Surgery;  Laterality: N/A;   EGD N/A 05/04/2017   Procedure: EGD;  Surgeon: Portenier, Lonell Cheryl, MD;  Location: [facility] ENDO/BRON[facility];  Service: General Surgery;  Laterality: N/A;   Partial right knee arthroscopy,arthroscopic medial and lateral menisectomy, plica excision (Right) Right 11/11/2018   Dr. Kathlynn   Foot surgery  2024   ESOPHAGOGASTRODOUDENOSCOPY W/BIOPSY N/A 02/18/2024   Procedure: ESOPHAGOGASTRODUODENOSCOPY, FLEXIBLE, TRANSORAL; WITH BIOPSY, SINGLE OR MULTIPLE;  Surgeon: Cyrena Laneta Drown, MD;  Location: [facility] ENDO/BRONCH;  Service: Gastroenterology;  Laterality: N/A;   COLONOSCOPY W/BIOPSY N/A 03/11/2024   Procedure: COLONOSCOPY;  Surgeon: Rudolph Rima, MD;  Location: [facility] ENDO/BRONCH;  Service: Gastroenterology;  Laterality: N/A;   CHOLECYSTECTOMY     CYSTOSCOPY W/ URETEROSCOPY     HYSTERECTOMY     KNEE SURGERY Right    Arthroscopy x 2   SINUS SURGERY     TOTAL  ABDOMINAL HYSTERECTOMY W/ BILATERAL SALPINGOOPHORECTOMY     For menometrorrhagia and family history of ovarian cancer    vascular surgery leg     Diet:  Reports tolerating the stage 4 regular texture/solid food diet and reports intolerance to acidic food and spicy food at this time.  24 hour recall shows a meal plan with 3 meals and 2 snacks daily.  Meal volume is ~1/2 cup; reports stress-eating.  Reports not planning meals; reports skipping meals and snacks sometimes due to busy work schedule.  Denies grazing, denies drinking with meals and denies eating mid sleep cycle.     Usual Meal Pattern: Wakes up at 6:15 AM 7:00 AM BF:  8-16 ounces regular coffee, cereal and Fairlife milk 10:30 AM Snack:  cashews 1 PM Lunch:  fast food ([employer] or [employer]), vegetable soup ([employer]), 1 s/w 3:30 PM Snack:  3/4 cup grapes or banana or 1/2 (50 gram) protein shake every other day 7:30 PM Dinner:  slow cooked meal - chicken and noodles, vegetable soup HS Snack: None due to eating dinner later   Hydration:  Reports drinking regular coffee, water with Crystal Light; ~<64 ounces hydrating fluids daily.  Intake of alcoholic beverages:  No   Vitamin/Mineral Supplementation:  Reports taking 1 Bariatric Pal multivitamins daily; 2 Viactiv calcium supplements daily; other supplements taken:  vitamin D2 weekly and vitamin B12 daily   Bowel Habits:  Reports no issues    Physical Activity:  She is engaging in: ADLs only due to recent foot surgery    Nutrition Labs:   Vit D, 25-Hydroxy 30.0 - 100.0 ng/mL 25.9 Low    Calcium 8.7 - 10.3 mg/dL 8.6 Low    Vitamin A-87 232 - 1245 pg/mL 857   Vitamin B6 3.4 - 65.2 ug/L 10.1    Latest Reference Range & Units Most Recent  Ferritin 11 - 204 ng/mL 10 (L) 03/10/24 11:45   Need re-check of calcium, B12, ferritin, and vitamin D     Weight/Weight Loss/Body Composition:  Weight regain of ~  25 lbs. In the last 5 years.  Open to trying weight loss meds.  On  Lyrica  and nortriptyline which have weight gain as side effect. Ms. LESSA HUGE reports weighing self once a week.  She was prescribed a GLP-1 medication but insurance has denied it.   Nutrition Concerns:  Weight regain and abnormal nutrition-related labs   Patient Identified Goals: Follow a healthy meal and snack pattern of eating a small protein-containing meal within 1 hour of waking up and every 3-4 hours until goals are met.  Use protein shakes or protein bars as meal/snack substitute. Track food and fluid intake on a food tracker app at least 3 days per week.   Take multivitamins with iron at bed time.  Take 5,000 IU vitamin D3 with Viactiv calcium chew at lunch and dinner.  Take B12 supplement with bariatric multivitamins at bed time. Follow up with dietitian on 07/21/24 in clinic.   Intervention   1.  Ms. KIMETHA TRULSON was instructed to follow a 1,100 - 1,300 calorie high protein, low fat, lower carbohydrate meal plan.  Sample meal plan and menu were provided and reviewed.  Protein intake goal: 80 - 100 grams per day.  Fluid intake goal: 85 - 105 ounces daily. 2.  She will continue all recommended bariatric eating behaviors and meal planning principles to help maintain good health, prevent weight regain, achieve goal weight. 3.  Patient will resume tracking daily food and fluid intake, in an effort to stay on track with diet and meeting daily goals. 4.  Other recommendations:  Take bariatric surgery specific multivitamins with iron, calcium supplements and other vitamin or mineral supplements as prescribed/recommended.  Engage in structured cardio and strength training physical activity 3-5 days per week.  5.  Follow up with RD on 07/21/24 or prn is recommended. Licensed Producer, Television/film/video Diabetes Care and Pharmacist, Community

## 2024-06-20 ENCOUNTER — Other Ambulatory Visit (HOSPITAL_COMMUNITY): Payer: Self-pay

## 2024-06-20 ENCOUNTER — Telehealth: Payer: Self-pay

## 2024-06-20 NOTE — Telephone Encounter (Signed)
 Pharmacy Patient Advocate Encounter   Received notification from [facility] that prior authorization for Wegovy  is required/requested.   Insurance verification completed.   The patient is insured through [facility].   Per test claim: Per test claim, medication is not covered due to plan/benefit exclusion, PA not submitted at this time

## 2024-06-23 ENCOUNTER — Ambulatory Visit

## 2024-06-23 DIAGNOSIS — R2689 Other abnormalities of gait and mobility: Secondary | ICD-10-CM

## 2024-06-23 DIAGNOSIS — R208 Other disturbances of skin sensation: Secondary | ICD-10-CM

## 2024-06-23 DIAGNOSIS — M6281 Muscle weakness (generalized): Secondary | ICD-10-CM

## 2024-06-23 NOTE — Therapy (Signed)
 " OUTPATIENT PHYSICAL THERAPY NEURO EVALUATION   Patient Name: Sharon [name] MRN: [id number] DOB:27-Apr-1961, 64 y.o., female Today's Date: 06/23/2024   PCP: Onita Devere POUR, MD  REFERRING PROVIDER: Lane Arthea BRAVO, MD   END OF SESSION:  PT End of Session - 06/23/24 1559     Visit Number 1    Date for Recertification  09/15/24    PT Start Time 1450    PT Stop Time 1550    PT Time Calculation (min) 60 min    Equipment Utilized During Treatment Gait belt    Activity Tolerance Patient tolerated treatment well    Behavior During Therapy WFL for tasks assessed/performed          Past Medical History:  Diagnosis Date   Acid reflux    H/O PRIOR TO GASTRC BYPASS   Allergy 35 years ago   Anemia    H/O   Anxiety    Arthritis    Bladder spasm    Depression    Headache    H/O MIGRAINES   History of kidney stones    Left ureteral stone    Leg DVT (deep venous thromboembolism), acute (HCC)    x 2-both times after a surgery-went to see hematologist 2017 (Dr Jacobo)  and no clotting disorder ever found   Neuromuscular disorder (HCC) 05/2023   Neurapathy (over 50% both legs below knees)   Osteoporosis 03/27/2023   During foot surgery my big toe was being cut and it crumpled in dr hand   Restless leg syndrome    Sepsis (HCC)    FROM KIDNEY STONES   Sleep apnea 15 years ago   HAD GASTRIC BYPASS AND DOES NOT HAVE SLEEP APNEA   Ulcer 11/19/2023   On meds now and Im scheduled for endoscopy Feb 26, 2024   Urge incontinence    Past Surgical History:  Procedure Laterality Date   ABDOMINAL HYSTERECTOMY  05/2006   TOTAL ABDOMINAL HYSTERECTOMY W/ BILATERAL SALPINGOOPHORECTOMY   BARIATRIC SURGERY  05/15/2016   CHOLECYSTECTOMY     ESOPHAGOGASTRODUODENOSCOPY  05/15/2016   KIDNEY STONE SURGERY     KNEE ARTHROSCOPY Right    X 2   KNEE ARTHROSCOPY Right 11/11/2018   Procedure: Partial right knee arthroscopy,arthroscopic medial and lateral menisectomy, plica excision;  Surgeon:  Kathlynn Sharper, MD;  Location: [facility];  Service: Orthopedics;  Laterality: Right;   KNEE SURGERY Left 2006   SHOULDER ARTHROSCOPY WITH OPEN ROTATOR CUFF REPAIR Right 11/22/2015   Procedure: SHOULDER ARTHROSCOPY, DEBRIDEMENT, DECOMPRESSION, SLAP REPAIR, POSSIBLE BICEP TENODESIS;  Surgeon: Norleen JINNY Maltos, MD;  Location: [facility];  Service: Orthopedics;  Laterality: Right;   SINUS EXPLORATION     SMALL INTESTINE SURGERY  05/14/2016   Gastric bypass   Patient Active Problem List   Diagnosis Date Noted   Loss of balance 06/05/2024   Muscle cramps 06/05/2024   At risk for falls 05/13/2024   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   Peripheral neuropathy 04/16/2024   Family history of colon cancer in mother 04/16/2024   Gastric ulcer 03/15/2024   Osteoporosis 03/15/2024   Abnormal TSH 03/15/2024   Acute lower GI bleeding 03/10/2024   Vitamin B 12 deficiency 03/10/2024   Pulmonary nodules 12/02/2021   Allergic rhinitis due to animal (cat) (dog) hair and dander 07/04/2021   Allergic rhinitis due to pollen 07/04/2021   Acquired pes planus of left foot 07/01/2021   Anxiety 03/30/2019   Depression 03/30/2019   Migraine 03/30/2019   Marginal ulcer 03/24/2017  Status post bariatric surgery 03/24/2017   Hydronephrosis 11/15/2016   GERD (gastroesophageal reflux disease) 11/15/2016   Acute deep vein thrombosis (DVT) of popliteal vein of right lower extremity (HCC) 01/04/2016   Moderate obstructive sleep apnea 09/21/2015   Kidney stones 09/10/2015   Urge incontinence 09/10/2015   S/P laparoscopic cholecystectomy 09/05/2015   Paralabral cyst of shoulder, right, subsequent encounter 01/10/2015    ONSET DATE: 2024  REFERRING DIAG: R26.89 (ICD-10-CM) - Imbalance   THERAPY DIAG:  Muscle weakness (generalized)  Decreased sensation of leg  Other abnormalities of gait and mobility  Rationale for Evaluation and Treatment: Rehabilitation  SUBJECTIVE:                                                                                                                                                                                              SUBJECTIVE STATEMENT: Patient reports having surgery in November 2024 on the top part of her R foot. She reports she had to have an implant in R foot. She reports that she began to have numbness in knees down sense the surgery, especially the R side. She reports she has had nerve conduction study test done and has lost roughly 50% sensitivity.  Patient reports that she has fallen 7x since March of 2025. She reports she just had a recent fall 2 weeks ago in kitchen. She reports that she feels like she has her foot up enough when stepping and catches toe on ground. Patient uses Hurry[employer] in community and does not use AD at home. Patient is temporarily not work, but works as automotive engineer at [employer].    Pt accompanied by: self  PERTINENT HISTORY: R foot surgery in 2024 has had difficulty with sensation in lower extremities since.   PAIN:  Are you having pain? Yes: NPRS scale: 7 Pain location: R foot/lower leg.  Pain description: Constant sharp/stabbing Aggravating factors: Standing up for longer period of time.  Relieving factors: sitting, elevating feet, massage, and heat.  PRECAUTIONS: None  RED FLAGS: None   WEIGHT BEARING RESTRICTIONS: No  FALLS: Has patient fallen in last 6 months? Yes. Number of falls 7  LIVING ENVIRONMENT: Lives with: lives alone Lives in: House/apartment Stairs: Yes: External: 6 steps; on left going up Has following equipment at home: Hurrycane  PLOF: Independent  PATIENT GOALS: Improve walking and balance.   OBJECTIVE:  Note: Objective measures were completed at Evaluation unless otherwise noted.  DIAGNOSTIC FINDINGS: FINDINGS: Bones/Joint/Cartilage   No fracture or dislocation. Normal alignment. No joint effusion. 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 Moderate arthritis of the  medial hallux sesamoid-metatarsal  articulation with subchondral marrow edema and severe subchondral cystic changes of the metatarsal head. Mild osteoarthritis of the navicular-middle cuneiform with subchondral reactive marrow changes. Small amount of fluid in the first, second and third intermetatarsal bursae.   Ligaments   Collateral ligaments are intact.  Lisfranc ligament is intact.   Muscles and Tendons   Flexor, peroneal and extensor compartment tendons are intact. Muscles are normal.   Soft tissue No fluid collection or hematoma.  No soft tissue mass.   IMPRESSION: 1. 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 2. Moderate arthritis of the medial hallux sesamoid-metatarsal articulation with subchondral marrow edema and severe subchondral cystic changes of the metatarsal head. 3. Mild osteoarthritis of the navicular-middle cuneiform with subchondral reactive marrow changes.  COGNITION: Overall cognitive status: Within functional limits for tasks assessed   SENSATION: Light touch impaired bilaterally, R greater than L.   COORDINATION: Unable to lift foot to toe tap.    LOWER EXTREMITY ROM:     WFL.  LOWER EXTREMITY MMT:    MMT Right Eval Left Eval  Hip flexion 3- 3-  Hip extension    Hip abduction 3 4-  Hip adduction 4+ 5  Hip internal rotation 4 4+  Hip external rotation 5 4/5  Knee flexion 3+ 4/5  Knee extension 3- 5  Ankle dorsiflexion 4- 5  Ankle plantarflexion    Ankle inversion    Ankle eversion    (Blank rows = not tested)    GAIT: Findings: Distance walked: 39' and Comments: Decreased step length, increased step width.   FUNCTIONAL TESTS:  5 times sit to stand: 22'' with bilateral UE.  Timed up and go (TUG): 19.91'' without AD.  6 minute walk test: To be performed at initial PT treatment.  10 meter walk test: 0.64 m/s without AD.  Berg Balance Scale:  Item Test date: 06/23/2024 Date:  Date:   Sitting to standing 3. able to  stand independently using hands Insert SmartPhrase OPRCBERGREEVAL Insert SmartPhrase OPRCBERGREEVAL  2. Standing unsupported 3. able to stand 2 minutes with supervision    3. Sitting with back unsupported, feet supported 4. able to sit safely and securely for 2 minutes    4. Standing to sitting 3. controls descent by using hands    5. Pivot transfer  3. able to transfer safely with definite need of hands    6. Standing unsupported with eyes closed 3. able to stand 10 seconds with supervision    7. Standing unsupported with feet together 3. able to place feet together independently and stand 1 minute with supervision    8. Reaching forward with outstretched arms while standing 2. can reach forward 5 cm (2 inches)    9. Pick up object from the floor from standing 0. unable to try/needs assistance to keep from losing balance or falling    10. Turning to look behind over left and right shoulders while standing 4. looks behind from both sides and weight shifts well    11. Turn 360 degrees 2. able to turn 360 degrees safely but slowly    12. Place alternate foot on step or stool while standing unsupported 3. able to stand independently and complete 8 steps in > 20 seconds     13. Standing unsupported one foot in front 2. able to take small step independently and hold 30 seconds    14. Standing on one leg 1. tries to lift leg unable to hold 3 seconds but remains standing independently.  Total Score 36/56 Total Score:    Total Score:      PATIENT SURVEYS:  ABC scale: The Activities-Specific Balance Confidence (ABC) Scale 0% 10 20 30  40 50 60 70 80 90 100% No confidence<->completely confident  How confident are you that you will not lose your balance or become unsteady when you . . .   Date tested 06/23/2024  Walk around the house 50%  2. Walk up or down stairs 30%  3. Bend over and pick up a slipper from in front of a closet floor 30%  4. Reach for a small can off a shelf at eye level 90%   5. Stand on tip toes and reach for something above your head 30%  6. Stand on a chair and reach for something 0%  7. Sweep the floor 60%  8. Walk outside the house to a car parked in the driveway 50%  9. Get into or out of a car 60%  10. Walk across a parking lot to the mall 0%  11. Walk up or down a ramp 0%  12. Walk in a crowded mall where people rapidly walk past you 0%  13. Are bumped into by people as you walk through the mall 0%  14. Step onto or off of an escalator while you are holding onto the railing 30%  15. Step onto or off an escalator while holding onto parcels such that you cannot hold onto the railing 30%  16. Walk outside on icy sidewalks 0%  Total: #/16 28.75%                                                                                                                                 TREATMENT DATE: 06/23/2024    PATIENT EDUCATION: Education details: HEP, Safety with gait at home, Proper use of cane. Person educated: Patient Education method: Explanation, Demonstration, and Verbal cues Education comprehension: verbalized understanding, returned demonstration, verbal cues required, and needs further education  HOME EXERCISE PROGRAM: Access Code: [email or web address] URL: [email or web address] Date: 06/23/2024 Prepared by: Norman Sharps  Exercises - Supine Bridge  - 1 x daily - 7 x weekly - 1 sets - 10 reps - 3 seocnd hold - Seated Heel Toe Raises  - 1 x daily - 7 x weekly - 1 sets - 10 reps - Seated March  - 1 x daily - 7 x weekly - 1 sets - 10 reps - Sidelying Hip Abduction  - 1 x daily - 7 x weekly - 1 sets - 10 reps - Sit to Stand with Counter Support  - 1 x daily - 7 x weekly - 1 sets - 10 reps - Romberg Stance  - 1 x daily - 7 x weekly - 1 sets - 3 reps - 30 second hold  GOALS: Goals reviewed with patient? Yes  SHORT TERM GOALS: Target date: 07/07/2024  Pt will be independent with HEP in 2 weeks allowing  her to perform HEP at home with less  difficulty and increased safety.  Baseline: HEP initiated at evaluation. Goal status: INITIAL  LONG TERM GOALS: Target date: 09/15/2024  1.  Patient (> 53 years old) will complete five times sit to stand test in < 15 seconds indicating an increased LE strength and improved balance. Baseline: 22'' with UE use. Goal status: INITIAL  2.  Patient will increase Berg Balance score by > 6 points to demonstrate decreased fall risk during functional activities. Baseline: 36/56 Goal status: INITIAL   3.  Patient will reduce timed up and go to <11 seconds to reduce fall risk and demonstrate improved transfer/gait ability. Baseline: 19.9'' without AD. Goal status: INITIAL  4.  Patient will increase 10 meter walk test to >1.34m/s as to improve gait speed for better community ambulation and to reduce fall risk. Baseline: 0.64 m/s without AD Goal status: INITIAL  5.  Patient will increase six minute walk test distance to >1000 for progression to community ambulator and improve gait ability Baseline: To be tested at initial treatment. Goal status: INITIAL  6.  Patient will increase ABC score by 30% percent to demonstrate decreased fall risk during functional activities. Baseline: 28.75 Goal status: INITIAL    ASSESSMENT:  CLINICAL IMPRESSION: Patient is a 64 y.o. female who was seen today for physical therapy evaluation and treatment for recent falls following a R foot surgery she had in 2024. Patient has had decreased sensation in lower extremities, especially R LE following her surgery. She reports having 7 falls since March of last year. Pt was noted to have significant weakness in lower extremities with MMT. Decreased sensation noted in lower extremities with sensory testing. Gait abnormality noted with decreased gait speed during functional outcome measures. Poor confidence noted on ABC scale. Poor balance noted with BERG. Pt is a good candidate for skilled PT 2x/week to improve on deficits  listed above reducing her risk of falls and improving her functional mobility.   OBJECTIVE IMPAIRMENTS: Abnormal gait, decreased activity tolerance, decreased balance, decreased coordination, decreased mobility, difficulty walking, decreased ROM, decreased strength, impaired sensation, and pain.   ACTIVITY LIMITATIONS: bending, standing, squatting, stairs, and transfers  PARTICIPATION LIMITATIONS: meal prep, cleaning, laundry, shopping, community activity, occupation, and yard work  PERSONAL FACTORS: Past/current experiences and PMH: Peripheral Neuropathy, DVT, Osteoporosis. are also affecting patient's functional outcome.   REHAB POTENTIAL: Good  CLINICAL DECISION MAKING: Stable/uncomplicated  EVALUATION COMPLEXITY: Low  PLAN:  PT FREQUENCY: 2x/week  PT DURATION: 12 weeks  PLANNED INTERVENTIONS: 97164- PT Re-evaluation, 97750- Physical Performance Testing, 97110-Therapeutic exercises, 97530- Therapeutic activity, [id number]- Neuromuscular re-education, 97535- Self Care, [id number]- Manual therapy, [id number]- Gait training, Patient/Family education, Balance training, Stair training, and Vestibular training  PLAN FOR NEXT SESSION: Perform 6 minute walk test, Review HEP and add to it.  Continue working to improve balance, gait mechanics, overall functional mobility.    Norman KATHEE Sharps, PT 06/23/2024, 4:12 PM        "

## 2024-06-27 ENCOUNTER — Ambulatory Visit

## 2024-06-30 ENCOUNTER — Ambulatory Visit: Admitting: Physical Therapy

## 2024-07-04 ENCOUNTER — Ambulatory Visit: Admitting: Family Medicine
